# Patient Record
Sex: Male | Born: 1940
Health system: Southern US, Community
[De-identification: ages and names within clinical notes are randomized; demographics above are authoritative.]

## PROBLEM LIST (undated history)

## (undated) DIAGNOSIS — M1990 Unspecified osteoarthritis, unspecified site: Secondary | ICD-10-CM

## (undated) DIAGNOSIS — K219 Gastro-esophageal reflux disease without esophagitis: Secondary | ICD-10-CM

## (undated) DIAGNOSIS — I10 Essential (primary) hypertension: Secondary | ICD-10-CM

## (undated) DIAGNOSIS — N529 Male erectile dysfunction, unspecified: Secondary | ICD-10-CM

## (undated) DIAGNOSIS — D352 Benign neoplasm of pituitary gland: Secondary | ICD-10-CM

## (undated) DIAGNOSIS — N189 Chronic kidney disease, unspecified: Secondary | ICD-10-CM

## (undated) DIAGNOSIS — Z7251 High risk heterosexual behavior: Secondary | ICD-10-CM

## (undated) DIAGNOSIS — E785 Hyperlipidemia, unspecified: Secondary | ICD-10-CM

## (undated) DIAGNOSIS — J189 Pneumonia, unspecified organism: Secondary | ICD-10-CM

## (undated) DIAGNOSIS — F17209 Nicotine dependence, unspecified, with unspecified nicotine-induced disorders: Secondary | ICD-10-CM

## (undated) HISTORY — DX: Nicotine dependence, unspecified, with unspecified nicotine-induced disorders: F17.209

## (undated) HISTORY — DX: Male erectile dysfunction, unspecified: N52.9

## (undated) HISTORY — DX: Essential (primary) hypertension: I10

## (undated) HISTORY — DX: High risk heterosexual behavior: Z72.51

## (undated) HISTORY — DX: Chronic kidney disease, unspecified: N18.9

## (undated) HISTORY — DX: Hyperlipidemia, unspecified: E78.5

---
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
## 2010-01-05 DIAGNOSIS — J189 Pneumonia, unspecified organism: Secondary | ICD-10-CM

## 2010-01-05 HISTORY — DX: Pneumonia, unspecified organism: J18.9

## 2011-11-17 LAB — HM COLONOSCOPY

## 2013-01-05 HISTORY — PX: COLONOSCOPY: SHX174

## 2014-04-19 ENCOUNTER — Encounter: Payer: Self-pay | Admitting: *Deleted

## 2014-04-19 DIAGNOSIS — Z720 Tobacco use: Secondary | ICD-10-CM

## 2014-04-19 DIAGNOSIS — E785 Hyperlipidemia, unspecified: Secondary | ICD-10-CM | POA: Insufficient documentation

## 2014-04-19 DIAGNOSIS — N528 Other male erectile dysfunction: Secondary | ICD-10-CM

## 2014-04-19 DIAGNOSIS — I129 Hypertensive chronic kidney disease with stage 1 through stage 4 chronic kidney disease, or unspecified chronic kidney disease: Secondary | ICD-10-CM | POA: Insufficient documentation

## 2014-04-19 DIAGNOSIS — N189 Chronic kidney disease, unspecified: Secondary | ICD-10-CM | POA: Insufficient documentation

## 2014-04-19 DIAGNOSIS — I10 Essential (primary) hypertension: Secondary | ICD-10-CM

## 2014-04-19 DIAGNOSIS — N529 Male erectile dysfunction, unspecified: Secondary | ICD-10-CM | POA: Insufficient documentation

## 2014-04-19 DIAGNOSIS — Z7251 High risk heterosexual behavior: Secondary | ICD-10-CM

## 2014-04-19 DIAGNOSIS — F17210 Nicotine dependence, cigarettes, uncomplicated: Secondary | ICD-10-CM | POA: Insufficient documentation

## 2014-06-13 ENCOUNTER — Encounter: Payer: Self-pay | Admitting: Unknown Physician Specialty

## 2014-06-13 ENCOUNTER — Ambulatory Visit (INDEPENDENT_AMBULATORY_CARE_PROVIDER_SITE_OTHER): Payer: PPO | Admitting: Unknown Physician Specialty

## 2014-06-13 VITALS — BP 117/76 | HR 51 | Temp 97.2°F | Ht 69.5 in | Wt 222.0 lb

## 2014-06-13 DIAGNOSIS — I10 Essential (primary) hypertension: Secondary | ICD-10-CM

## 2014-06-13 DIAGNOSIS — N183 Chronic kidney disease, stage 3 (moderate): Secondary | ICD-10-CM

## 2014-06-13 NOTE — Assessment & Plan Note (Signed)
Check kidney function with GFR of 47

## 2014-06-13 NOTE — Progress Notes (Signed)
   BP 117/76 mmHg  Pulse 51  Temp(Src) 97.2 F (36.2 C) (Oral)  Ht 5' 9.5" (1.765 m)  Wt 222 lb (100.699 kg)  BMI 32.32 kg/m2  SpO2 98%   Subjective:    Patient ID: Matthew Randolph, male    DOB: Feb 27, 1940, 74 y.o.   MRN: [id number]  HPI: Matthew Randolph is a 74 y.o. male presenting on 06/13/2014 for Follow-up      Relevant past medical, surgical, family and social history reviewed and updated as indicated. Interim medical history since our last visit reviewed. Allergies and medications reviewed and updated.  Hypertension This is a chronic problem. The problem is controlled. Pertinent negatives include no anxiety, blurred vision, chest pain, headaches, malaise/fatigue, neck pain, orthopnea, palpitations, peripheral edema, PND, shortness of breath or sweats. The current treatment provides significant improvement. There are no compliance problems.  Hypertensive end-organ damage includes kidney disease.     Review of Systems  Constitutional: Negative for malaise/fatigue.  Eyes: Negative for blurred vision.  Respiratory: Negative for shortness of breath.   Cardiovascular: Negative for chest pain, palpitations, orthopnea and PND.  Musculoskeletal: Negative for neck pain.  Neurological: Negative for headaches.      Per HPI unless specifically indicated above     Objective:    BP 117/76 mmHg  Pulse 51  Temp(Src) 97.2 F (36.2 C) (Oral)  Ht 5' 9.5" (1.765 m)  Wt 222 lb (100.699 kg)  BMI 32.32 kg/m2  SpO2 98%  Wt Readings from Last 3 Encounters:  06/13/14 222 lb (100.699 kg)  12/13/13 224 lb (101.606 kg)  04/19/14 224 lb (101.606 kg)    Physical Exam  Constitutional: He is oriented to person, place, and time. He appears well-developed and well-nourished. No distress.  HENT:  Head: Normocephalic and atraumatic.  Eyes: Conjunctivae and lids are normal. Right eye exhibits no discharge. Left eye exhibits no discharge. No scleral icterus.  Cardiovascular: Normal rate and  regular rhythm.   Pulmonary/Chest: Effort normal and breath sounds normal. No respiratory distress.  Abdominal: Normal appearance. There is no splenomegaly or hepatomegaly.  Musculoskeletal: Normal range of motion.  Lymphadenopathy:    He has no cervical adenopathy.  Neurological: He is alert and oriented to person, place, and time.  Skin: Skin is warm, dry and intact. No rash noted. No pallor.  Psychiatric: He has a normal mood and affect. His behavior is normal. Judgment and thought content normal.         Assessment & Plan:    Problem List Items Addressed This Visit      Cardiovascular and Mediastinum   Benign essential HTN - Primary    Check kidney function with GFR of 47        Genitourinary   CKD (chronic kidney disease)    Check CMP         Follow up plan: 6 months.  Consider nephrology refill

## 2014-06-13 NOTE — Assessment & Plan Note (Signed)
Check CMP.  ?

## 2014-06-13 NOTE — Patient Instructions (Addendum)
Hypertension Hypertension, commonly called high blood pressure, is when the force of blood pumping through your arteries is too strong. Your arteries are the blood vessels that carry blood from your heart throughout your body. A blood pressure reading consists of a higher number over a lower number, such as 110/72. The higher number (systolic) is the pressure inside your arteries when your heart pumps. The lower number (diastolic) is the pressure inside your arteries when your heart relaxes. Ideally you want your blood pressure below 120/80. Hypertension forces your heart to work harder to pump blood. Your arteries may become narrow or stiff. Having hypertension puts you at risk for heart disease, stroke, and other problems.  RISK FACTORS Some risk factors for high blood pressure are controllable. Others are not.  Risk factors you cannot control include:   Race. You may be at higher risk if you are African American.  Age. Risk increases with age.  Gender. Men are at higher risk than women before age 45 years. After age 65, women are at higher risk than men. Risk factors you can control include:  Not getting enough exercise or physical activity.  Being overweight.  Getting too much fat, sugar, calories, or salt in your diet.  Drinking too much alcohol. SIGNS AND SYMPTOMS Hypertension does not usually cause signs or symptoms. Extremely high blood pressure (hypertensive crisis) may cause headache, anxiety, shortness of breath, and nosebleed. DIAGNOSIS  To check if you have hypertension, your health care provider will measure your blood pressure while you are seated, with your arm held at the level of your heart. It should be measured at least twice using the same arm. Certain conditions can cause a difference in blood pressure between your right and left arms. A blood pressure reading that is higher than normal on one occasion does not mean that you need treatment. If one blood pressure reading  is high, ask your health care provider about having it checked again. TREATMENT  Treating high blood pressure includes making lifestyle changes and possibly taking medicine. Living a healthy lifestyle can help lower high blood pressure. You may need to change some of your habits. Lifestyle changes may include:  Following the DASH diet. This diet is high in fruits, vegetables, and whole grains. It is low in salt, red meat, and added sugars.  Getting at least 2 hours of brisk physical activity every week.  Losing weight if necessary.  Not smoking.  Limiting alcoholic beverages.  Learning ways to reduce stress. If lifestyle changes are not enough to get your blood pressure under control, your health care provider may prescribe medicine. You may need to take more than one. Work closely with your health care provider to understand the risks and benefits. HOME CARE INSTRUCTIONS  Have your blood pressure rechecked as directed by your health care provider.   Take medicines only as directed by your health care provider. Follow the directions carefully. Blood pressure medicines must be taken as prescribed. The medicine does not work as well when you skip doses. Skipping doses also puts you at risk for problems.   Do not smoke.   Monitor your blood pressure at home as directed by your health care provider. SEEK MEDICAL CARE IF:   You think you are having a reaction to medicines taken.  You have recurrent headaches or feel dizzy.  You have swelling in your ankles.  You have trouble with your vision. SEEK IMMEDIATE MEDICAL CARE IF:  You develop a severe headache or confusion.    You have unusual weakness, numbness, or feel faint.  You have severe chest or abdominal pain.  You vomit repeatedly.  You have trouble breathing. MAKE SURE YOU:   Understand these instructions.  Will watch your condition.  Will get help right away if you are not doing well or get worse. Document  Released: 12/22/2004 Document Revised: 05/08/2013 Document Reviewed: 10/14/2012 [facility] Patient Information 2015 [place], [place]. This information is not intended to replace advice given to you by your health care provider. Make sure you discuss any questions you have with your health care provider. Hypertension Hypertension, commonly called high blood pressure, is when the force of blood pumping through your arteries is too strong. Your arteries are the blood vessels that carry blood from your heart throughout your body. A blood pressure reading consists of a higher number over a lower number, such as 110/72. The higher number (systolic) is the pressure inside your arteries when your heart pumps. The lower number (diastolic) is the pressure inside your arteries when your heart relaxes. Ideally you want your blood pressure below 120/80. Hypertension forces your heart to work harder to pump blood. Your arteries may become narrow or stiff. Having hypertension puts you at risk for heart disease, stroke, and other problems.  RISK FACTORS Some risk factors for high blood pressure are controllable. Others are not.  Risk factors you cannot control include:   Race. You may be at higher risk if you are African American.  Age. Risk increases with age.  Gender. Men are at higher risk than women before age 93 years. After age 91, women are at higher risk than men. Risk factors you can control include:  Not getting enough exercise or physical activity.  Being overweight.  Getting too much fat, sugar, calories, or salt in your diet.  Drinking too much alcohol. SIGNS AND SYMPTOMS Hypertension does not usually cause signs or symptoms. Extremely high blood pressure (hypertensive crisis) may cause headache, anxiety, shortness of breath, and nosebleed. DIAGNOSIS  To check if you have hypertension, your health care provider will measure your blood pressure while you are seated, with your arm held at the level  of your heart. It should be measured at least twice using the same arm. Certain conditions can cause a difference in blood pressure between your right and left arms. A blood pressure reading that is higher than normal on one occasion does not mean that you need treatment. If one blood pressure reading is high, ask your health care provider about having it checked again. TREATMENT  Treating high blood pressure includes making lifestyle changes and possibly taking medicine. Living a healthy lifestyle can help lower high blood pressure. You may need to change some of your habits. Lifestyle changes may include:  Following the DASH diet. This diet is high in fruits, vegetables, and whole grains. It is low in salt, red meat, and added sugars.  Getting at least 2 hours of brisk physical activity every week.  Losing weight if necessary.  Not smoking.  Limiting alcoholic beverages.  Learning ways to reduce stress. If lifestyle changes are not enough to get your blood pressure under control, your health care provider may prescribe medicine. You may need to take more than one. Work closely with your health care provider to understand the risks and benefits. HOME CARE INSTRUCTIONS  Have your blood pressure rechecked as directed by your health care provider.   Take medicines only as directed by your health care provider. Follow the directions carefully. Blood  pressure medicines must be taken as prescribed. The medicine does not work as well when you skip doses. Skipping doses also puts you at risk for problems.   Do not smoke.   Monitor your blood pressure at home as directed by your health care provider. SEEK MEDICAL CARE IF:   You think you are having a reaction to medicines taken.  You have recurrent headaches or feel dizzy.  You have swelling in your ankles.  You have trouble with your vision. SEEK IMMEDIATE MEDICAL CARE IF:  You develop a severe headache or confusion.  You have  unusual weakness, numbness, or feel faint.  You have severe chest or abdominal pain.  You vomit repeatedly.  You have trouble breathing. MAKE SURE YOU:   Understand these instructions.  Will watch your condition.  Will get help right away if you are not doing well or get worse. Document Released: 12/22/2004 Document Revised: 05/08/2013 Document Reviewed: 10/14/2012 [facility] Patient Information 2015 [place], [place]. This information is not intended to replace advice given to you by your health care provider. Make sure you discuss any questions you have with your health care provider.

## 2014-06-14 ENCOUNTER — Telehealth: Payer: Self-pay

## 2014-06-14 LAB — COMPREHENSIVE METABOLIC PANEL
A/G RATIO: 1.5 (ref 1.1–2.5)
ALT: 24 IU/L (ref 0–44)
AST: 19 IU/L (ref 0–40)
Albumin: 4.3 g/dL (ref 3.5–4.8)
Alkaline Phosphatase: 59 IU/L (ref 39–117)
BUN/Creatinine Ratio: 8 — ABNORMAL LOW (ref 10–22)
BUN: 11 mg/dL (ref 8–27)
Bilirubin Total: 0.4 mg/dL (ref 0.0–1.2)
CO2: 23 mmol/L (ref 18–29)
CREATININE: 1.45 mg/dL — AB (ref 0.76–1.27)
Calcium: 9.8 mg/dL (ref 8.6–10.2)
Chloride: 100 mmol/L (ref 97–108)
GFR calc Af Amer: 56 mL/min/{1.73_m2} — ABNORMAL LOW (ref >59)
GFR calc non Af Amer: 48 mL/min/{1.73_m2} — ABNORMAL LOW (ref >59)
GLOBULIN, TOTAL: 2.9 g/dL (ref 1.5–4.5)
Glucose: 105 mg/dL — ABNORMAL HIGH (ref 65–99)
Potassium: 5 mmol/L (ref 3.5–5.2)
SODIUM: 140 mmol/L (ref 134–144)
Total Protein: 7.2 g/dL (ref 6.0–8.5)

## 2014-06-14 NOTE — Telephone Encounter (Signed)
-----   Message from Kathrine Haddock, NP sent at 06/14/2014  8:36 AM EDT ----- Please let patient know that lab work was good.  Kidney function about the same.  We will continue to monitor this every 6 months. The blood pressure medication he is on helps protect the kidneys and will want to keep him on it even if his blood pressure is normal.   Blood sugar just slightly above normal and we will also monitor this.  Liver functions are fine.

## 2014-06-14 NOTE — Telephone Encounter (Signed)
Patient notified

## 2014-09-26 ENCOUNTER — Ambulatory Visit (INDEPENDENT_AMBULATORY_CARE_PROVIDER_SITE_OTHER): Payer: PPO | Admitting: Unknown Physician Specialty

## 2014-09-26 ENCOUNTER — Ambulatory Visit
Admission: RE | Admit: 2014-09-26 | Discharge: 2014-09-26 | Disposition: A | Discharge status: Home / Self Care | Payer: PPO | Source: Ambulatory Visit | Attending: Unknown Physician Specialty | Admitting: Unknown Physician Specialty

## 2014-09-26 ENCOUNTER — Encounter: Payer: Self-pay | Admitting: Unknown Physician Specialty

## 2014-09-26 VITALS — BP 130/76 | HR 58 | Temp 98.6°F | Ht 70.2 in | Wt 213.0 lb

## 2014-09-26 DIAGNOSIS — S76311A Strain of muscle, fascia and tendon of the posterior muscle group at thigh level, right thigh, initial encounter: Secondary | ICD-10-CM

## 2014-09-26 DIAGNOSIS — S76011A Strain of muscle, fascia and tendon of right hip, initial encounter: Secondary | ICD-10-CM

## 2014-09-26 DIAGNOSIS — X58XXXA Exposure to other specified factors, initial encounter: Secondary | ICD-10-CM | POA: Insufficient documentation

## 2014-09-26 DIAGNOSIS — S76019A Strain of muscle, fascia and tendon of unspecified hip, initial encounter: Secondary | ICD-10-CM | POA: Insufficient documentation

## 2014-09-26 MED ORDER — MELOXICAM 15 MG PO TABS: 15.0000 mg | ORAL_TABLET | Freq: Every day | ORAL | Status: DC

## 2014-09-26 NOTE — Assessment & Plan Note (Signed)
Ordered DG Pelvis 1-2 Views Prescribed Meloxicam (Mobic) 15 mg daily instructed pt to not take Ibuprofen while taking this medication Instructed to return if symptoms worsen or persist

## 2014-09-26 NOTE — Progress Notes (Signed)
BP 130/76 mmHg  Pulse 58  Temp(Src) 98.6 F (37 C)  Ht 5' 10.2" (1.783 m)  Wt 213 lb (96.616 kg)  BMI 30.39 kg/m2  SpO2 97%   Subjective:    Patient ID: Matthew Randolph, male    DOB: [birth year], 74 y.o.   MRN: [id number]  HPI: Matthew Randolph is a 74 y.o. male  Chief Complaint  Patient presents with  . Inguinal Hernia    pt states he thinks he has a hernia on the right side in the groin region, was lifting heavy bucket about a month ago   Right Groin Pain Pt presents with c/o right groin pain onset 1 month ago.  He picked up a jug of acid and felt pain that started at the right groin and traveled down his leg to the knee and right hip. He feels like when he walks he has a "catching sensation".  He had trouble walking following the injury.  He states he feels a slight bulge in his groin.  He has been using ibuprofen and icy hot with some relief of symptoms.  Aggravating factors include walking.  Rest improves the symptoms.  Pertinent negatives denies fever, nausea, vomiting, dysuria, scrotal or penile swelling, numbness or tingling to the lower extremities, bowel or bladder incontinence.  Relevant past medical, surgical, family and social history reviewed and updated as indicated. Interim medical history since our last visit reviewed. Allergies and medications reviewed and updated.  Review of Systems  Constitutional: Negative for fever, chills and fatigue.  Gastrointestinal: Negative for nausea, vomiting, abdominal pain, diarrhea, constipation and abdominal distention.  Genitourinary: Negative for dysuria, urgency, decreased urine volume, penile swelling, scrotal swelling, penile pain and testicular pain.  Musculoskeletal: Negative for back pain.       Right hip pain  Skin: Negative for color change and rash.  Neurological: Negative for tremors, syncope, weakness, light-headedness, numbness and headaches.    Per HPI unless specifically indicated above     Objective:    BP  130/76 mmHg  Pulse 58  Temp(Src) 98.6 F (37 C)  Ht 5' 10.2" (1.783 m)  Wt 213 lb (96.616 kg)  BMI 30.39 kg/m2  SpO2 97%  Wt Readings from Last 3 Encounters:  09/26/14 213 lb (96.616 kg)  06/13/14 222 lb (100.699 kg)  12/13/13 224 lb (101.606 kg)    Physical Exam  Constitutional: He is oriented to person, place, and time. He appears well-developed and well-nourished. No distress.  HENT:  Head: Normocephalic and atraumatic.  Right Ear: External ear normal.  Left Ear: External ear normal.  Nose: Nose normal.  Neck: Normal range of motion. Neck supple.  Cardiovascular: Normal rate, regular rhythm, normal heart sounds and intact distal pulses.   Pulmonary/Chest: Effort normal and breath sounds normal. No respiratory distress. He has no wheezes.  Abdominal: Soft. Bowel sounds are normal. He exhibits no distension and no mass. There is no rebound.  Right groin tenderness with palpation  Musculoskeletal: He exhibits no edema.  Right hip and groin tenderness Decreased ROM of right lower extremity  Neurological: He is alert and oriented to person, place, and time.  Skin: Skin is warm and dry. No rash noted. He is not diaphoretic. No erythema.  Psychiatric: He has a normal mood and affect. His behavior is normal. Judgment and thought content normal.    Results for orders placed or performed in visit on 06/13/14  Comp Met (CMET)  Result Value Ref Range   Glucose 105 (H)  65 - 99 mg/dL   BUN 11 8 - 27 mg/dL   Creatinine, Ser 1.45 (H) 0.76 - 1.27 mg/dL   GFR calc non Af Amer 48 (L) >59 mL/min/1.73   GFR calc Af Amer 56 (L) >59 mL/min/1.73   BUN/Creatinine Ratio 8 (L) 10 - 22   Sodium 140 134 - 144 mmol/L   Potassium 5.0 3.5 - 5.2 mmol/L   Chloride 100 97 - 108 mmol/L   CO2 23 18 - 29 mmol/L   Calcium 9.8 8.6 - 10.2 mg/dL   Total Protein 7.2 6.0 - 8.5 g/dL   Albumin 4.3 3.5 - 4.8 g/dL   Globulin, Total 2.9 1.5 - 4.5 g/dL   Albumin/Globulin Ratio 1.5 1.1 - 2.5   Bilirubin Total 0.4  0.0 - 1.2 mg/dL   Alkaline Phosphatase 59 39 - 117 IU/L   AST 19 0 - 40 IU/L   ALT 24 0 - 44 IU/L      Assessment & Plan:   Problem List Items Addressed This Visit      Unprioritized   Strain of psoas muscle - Primary    Ordered DG Pelvis 1-2 Views Prescribed Meloxicam (Mobic) 15 mg daily instructed pt to not take Ibuprofen while taking this medication Instructed to return if symptoms worsen or persist      Relevant Orders   DG Pelvis 1-2 Views       Follow up plan: Return if symptoms worsen or fail to improve.

## 2014-11-05 ENCOUNTER — Other Ambulatory Visit: Payer: Self-pay | Admitting: Unknown Physician Specialty

## 2014-11-27 ENCOUNTER — Other Ambulatory Visit: Payer: Self-pay | Admitting: Unknown Physician Specialty

## 2014-11-28 ENCOUNTER — Telehealth: Payer: Self-pay | Admitting: Unknown Physician Specialty

## 2014-11-28 NOTE — Telephone Encounter (Signed)
Looked in patient's chart and the medication was refilled yesterday and sent to [facility]. I called the pharmacy to make sure they got the prescription and they stated they did and that it was sent out yesterday. I then called and left the patient a voicemail stating this information.

## 2014-11-28 NOTE — Telephone Encounter (Signed)
Pt needs refill on lisinopril sent to [facility]. Pt would like to have refills put in if possible

## 2014-12-06 ENCOUNTER — Other Ambulatory Visit: Payer: Self-pay | Admitting: Unknown Physician Specialty

## 2014-12-12 ENCOUNTER — Ambulatory Visit: Payer: PPO | Admitting: Unknown Physician Specialty

## 2015-01-16 ENCOUNTER — Ambulatory Visit: Payer: PPO | Admitting: Unknown Physician Specialty

## 2015-01-23 ENCOUNTER — Encounter: Payer: Self-pay | Admitting: Unknown Physician Specialty

## 2015-01-23 ENCOUNTER — Ambulatory Visit (INDEPENDENT_AMBULATORY_CARE_PROVIDER_SITE_OTHER): Payer: PPO | Admitting: Unknown Physician Specialty

## 2015-01-23 VITALS — BP 117/66 | HR 46 | Temp 98.5°F | Ht 70.1 in | Wt 215.0 lb

## 2015-01-23 DIAGNOSIS — M25559 Pain in unspecified hip: Secondary | ICD-10-CM | POA: Insufficient documentation

## 2015-01-23 DIAGNOSIS — I10 Essential (primary) hypertension: Secondary | ICD-10-CM | POA: Diagnosis not present

## 2015-01-23 DIAGNOSIS — M25551 Pain in right hip: Secondary | ICD-10-CM

## 2015-01-23 LAB — LIPID PANEL PICCOLO, WAIVED
CHOL/HDL RATIO PICCOLO,WAIVE: 3.8 mg/dL
Cholesterol Piccolo, Waived: 243 mg/dL — ABNORMAL HIGH (ref <200)
HDL CHOL PICCOLO, WAIVED: 64 mg/dL (ref >59)
LDL CHOL CALC PICCOLO WAIVED: 158 mg/dL — AB (ref <100)
TRIGLYCERIDES PICCOLO,WAIVED: 107 mg/dL (ref <150)
VLDL CHOL CALC PICCOLO,WAIVE: 21 mg/dL (ref <30)

## 2015-01-23 LAB — MICROALBUMIN, URINE WAIVED
Creatinine, Urine Waived: 300 mg/dL (ref 10–300)
Microalb, Ur Waived: 30 mg/L — ABNORMAL HIGH (ref 0–19)
Microalb/Creat Ratio: <30 mg/g (ref <30)

## 2015-01-23 MED ORDER — MELOXICAM 15 MG PO TABS: 15.0000 mg | ORAL_TABLET | Freq: Every day | ORAL | Status: DC

## 2015-01-23 NOTE — Assessment & Plan Note (Signed)
Probable OA.  Will refer to Ortho and refill Meloxicam.

## 2015-01-23 NOTE — Progress Notes (Signed)
BP 117/66 mmHg  Pulse 46  Temp(Src) 98.5 F (36.9 C)  Ht 5' 10.1" (1.781 m)  Wt 215 lb (97.523 kg)  BMI 30.75 kg/m2  SpO2 98%   Subjective:    Patient ID: Matthew Randolph, male    DOB: 1940/08/24, 75 y.o.   MRN: [id number]  HPI: Matthew Randolph is a 76 y.o. male  Chief Complaint  Patient presents with  . Hypertension  . Groin Injury    pt states he is following up on a groin injury he had in the past  . Medication Refill    pt states he needs a refill on meloxicam   Groin pain States he is not over his groin injury that I evaluated in December.  X-rays show degenerative changes hips and spine He would like a refill of Meloxicam.  States pain comes and goes.    Hypertension Using medications without difficulty Average home BPs   No problems or lightheadedness No chest pain with exertion or shortness of breath No Edema  Memory Sister is concerned about possible memory loss and forgets to turn the stove off.  Sometimes is forgetful.  Pt states he has no problems besides hurting in his right hip.    Relevant past medical, surgical, family and social history reviewed and updated as indicated. Interim medical history since our last visit reviewed. Allergies and medications reviewed and updated.  Review of Systems  Per HPI unless specifically indicated above     Objective:    BP 117/66 mmHg  Pulse 46  Temp(Src) 98.5 F (36.9 C)  Ht 5' 10.1" (1.781 m)  Wt 215 lb (97.523 kg)  BMI 30.75 kg/m2  SpO2 98%  Wt Readings from Last 3 Encounters:  01/23/15 215 lb (97.523 kg)  09/26/14 213 lb (96.616 kg)  06/13/14 222 lb (100.699 kg)    Physical Exam  Constitutional: He is oriented to person, place, and time. He appears well-developed and well-nourished. No distress.  HENT:  Head: Normocephalic and atraumatic.  Eyes: Conjunctivae and lids are normal. Right eye exhibits no discharge. Left eye exhibits no discharge. No scleral icterus.  Neck: Normal range of motion. Neck  supple. No JVD present. Carotid bruit is not present.  Cardiovascular: Normal rate, regular rhythm and normal heart sounds.   Pulmonary/Chest: Effort normal and breath sounds normal. No respiratory distress.  Abdominal: Normal appearance. There is no splenomegaly or hepatomegaly.  Musculoskeletal: Normal range of motion.  Neurological: He is alert and oriented to person, place, and time.  Skin: Skin is warm, dry and intact. No rash noted. No pallor.  Psychiatric: He has a normal mood and affect. His behavior is normal. Judgment and thought content normal.    Results for orders placed or performed in visit on 01/16/15  HM COLONOSCOPY  Result Value Ref Range   HM Colonoscopy from [facility]       Assessment & Plan:   Problem List Items Addressed This Visit      Unprioritized   Benign essential HTN - Primary    Stable, continue present medications.        Relevant Orders   Lipid Panel Piccolo, Waived   Microalbumin, Urine Waived   Uric acid   Comprehensive metabolic panel   Hip pain    Probable OA.  Will refer to Ortho and refill Meloxicam.       Relevant Orders   Ambulatory referral to Orthopedic Surgery       Follow up plan: Return in about  6 months (around 07/23/2015) for physical.

## 2015-01-23 NOTE — Assessment & Plan Note (Signed)
Stable, continue present medications.   

## 2015-01-24 LAB — COMPREHENSIVE METABOLIC PANEL
A/G RATIO: 1.4 (ref 1.1–2.5)
ALT: 24 IU/L (ref 0–44)
AST: 18 IU/L (ref 0–40)
Albumin: 4.3 g/dL (ref 3.5–4.8)
Alkaline Phosphatase: 62 IU/L (ref 39–117)
BUN / CREAT RATIO: 7 — AB (ref 10–22)
BUN: 10 mg/dL (ref 8–27)
Bilirubin Total: 0.4 mg/dL (ref 0.0–1.2)
CALCIUM: 10.3 mg/dL — AB (ref 8.6–10.2)
CO2: 21 mmol/L (ref 18–29)
Chloride: 102 mmol/L (ref 96–106)
Creatinine, Ser: 1.4 mg/dL — ABNORMAL HIGH (ref 0.76–1.27)
GFR calc Af Amer: 58 mL/min/{1.73_m2} — ABNORMAL LOW (ref >59)
GFR, EST NON AFRICAN AMERICAN: 50 mL/min/{1.73_m2} — AB (ref >59)
GLOBULIN, TOTAL: 3.1 g/dL (ref 1.5–4.5)
Glucose: 114 mg/dL — ABNORMAL HIGH (ref 65–99)
POTASSIUM: 5.2 mmol/L (ref 3.5–5.2)
SODIUM: 142 mmol/L (ref 134–144)
Total Protein: 7.4 g/dL (ref 6.0–8.5)

## 2015-01-24 LAB — URIC ACID: Uric Acid: 5.3 mg/dL (ref 3.7–8.6)

## 2015-02-08 ENCOUNTER — Telehealth: Payer: Self-pay

## 2015-02-08 NOTE — Telephone Encounter (Signed)
-----   Message from Stark Klein sent at 02/07/2015  2:22 PM EST ----- pts gf called in and wanted to get lab results. i advised the pt to look on mychart but the gf insisted that they get a call back. There isn't a dpr available so i'm not sure if we can speak with her.

## 2015-02-08 NOTE — Telephone Encounter (Signed)
I sent a earlier message on Mychart which they received.  On phone call, i again reviewed his labs.

## 2015-02-08 NOTE — Telephone Encounter (Signed)
Matthew Randolph did you call this patient about his labs? I don't see where a letter was sent. The significant other is listed as an emergency contact, but I don't know if we can still talk to her or not.

## 2015-03-13 ENCOUNTER — Other Ambulatory Visit: Payer: Self-pay | Admitting: Unknown Physician Specialty

## 2015-03-14 ENCOUNTER — Other Ambulatory Visit: Payer: Self-pay | Admitting: Unknown Physician Specialty

## 2015-03-14 NOTE — Telephone Encounter (Signed)
Pt needs refill on lisinopril and metformin sent to [facility]

## 2015-03-14 NOTE — Telephone Encounter (Signed)
Routing to provider.   Last Visit: 01/23/2015 [facility]acy: [facility]  Request for Meloxicam 15mg  and Lisinopril 5mg .

## 2015-03-15 MED ORDER — MELOXICAM 15 MG PO TABS: 15.0000 mg | ORAL_TABLET | Freq: Every day | ORAL | Status: DC

## 2015-03-15 MED ORDER — LISINOPRIL 5 MG PO TABS: 5.0000 mg | ORAL_TABLET | Freq: Every day | ORAL | Status: DC

## 2015-03-15 NOTE — Telephone Encounter (Signed)
Please see chart.  Both rx's are up to date

## 2015-06-27 ENCOUNTER — Other Ambulatory Visit: Payer: Self-pay | Admitting: Unknown Physician Specialty

## 2015-10-10 ENCOUNTER — Other Ambulatory Visit: Payer: Self-pay | Admitting: Unknown Physician Specialty

## 2015-10-17 ENCOUNTER — Other Ambulatory Visit: Payer: Self-pay | Admitting: Ophthalmology

## 2015-10-17 DIAGNOSIS — H547 Unspecified visual loss: Secondary | ICD-10-CM

## 2015-10-25 NOTE — Telephone Encounter (Signed)
Your patient 

## 2015-11-01 ENCOUNTER — Ambulatory Visit: Payer: PPO

## 2015-11-27 ENCOUNTER — Other Ambulatory Visit: Payer: Self-pay | Admitting: Unknown Physician Specialty

## 2015-11-27 MED ORDER — MELOXICAM 15 MG PO TABS: 15.0000 mg | ORAL_TABLET | Freq: Every day | ORAL | 0 refills | Status: DC

## 2015-11-27 NOTE — Telephone Encounter (Signed)
Called and left patient a VM letting patient know that rx has been sent in.

## 2015-11-27 NOTE — Telephone Encounter (Signed)
Routing to provider  

## 2015-12-02 ENCOUNTER — Telehealth: Payer: Self-pay | Admitting: Unknown Physician Specialty

## 2015-12-02 NOTE — Telephone Encounter (Signed)
Pharmacy called and would like to know if he can have a 90 day supply of meloxicam (MOBIC) 15 MG tablet instead of the 30 day supply.

## 2015-12-02 NOTE — Telephone Encounter (Signed)
He needs to be seen for this.  Plus overdue to be seen anyway

## 2015-12-02 NOTE — Telephone Encounter (Signed)
Routing to provider  

## 2015-12-02 NOTE — Telephone Encounter (Signed)
Called and left patient a VM asking for him to please return my call.  

## 2015-12-02 NOTE — Telephone Encounter (Signed)
Called and left Marquila a secure VM at [place] letting her know why a 30 day supply of the medication was sent in.

## 2015-12-03 NOTE — Telephone Encounter (Signed)
Called and left patient a VM asking for him to please return my call.  

## 2015-12-04 ENCOUNTER — Other Ambulatory Visit
Admission: RE | Admit: 2015-12-04 | Discharge: 2015-12-04 | Disposition: A | Discharge status: Home / Self Care | Payer: PPO | Source: Ambulatory Visit | Attending: Ophthalmology | Admitting: Ophthalmology

## 2015-12-04 ENCOUNTER — Ambulatory Visit
Admission: RE | Admit: 2015-12-04 | Discharge: 2015-12-04 | Disposition: A | Discharge status: Home / Self Care | Payer: PPO | Source: Ambulatory Visit | Attending: Ophthalmology | Admitting: Ophthalmology

## 2015-12-04 ENCOUNTER — Telehealth: Payer: Self-pay | Admitting: Unknown Physician Specialty

## 2015-12-04 DIAGNOSIS — H547 Unspecified visual loss: Secondary | ICD-10-CM | POA: Diagnosis not present

## 2015-12-04 LAB — CREATININE, SERUM
CREATININE: 1.2 mg/dL (ref 0.61–1.24)
GFR calc Af Amer: >60 mL/min (ref >60)
GFR calc non Af Amer: 58 mL/min — ABNORMAL LOW (ref >60)

## 2015-12-04 MED ORDER — GADOBENATE DIMEGLUMINE 529 MG/ML IV SOLN
20.0000 mL | Freq: Once | INTRAVENOUS | Status: AC | PRN
  Administered 2015-12-04: 20 mL via INTRAVENOUS

## 2015-12-04 NOTE — Telephone Encounter (Signed)
Pt's relative called stated the pharmacy is still giving her the run around about the Meloxicam. Can someone please look into this. Please call pt back. Thanks.

## 2015-12-04 NOTE — Telephone Encounter (Signed)
Call was returned. I was explaining why the medication was only sent in for 30 days. Phone call disconnected so I tried to call back and left a VM asking for a returned call to make sure they got all of the information before the call dropped.

## 2015-12-04 NOTE — Telephone Encounter (Signed)
Patient called in and scheduled appointment for 12/27/15.

## 2015-12-04 NOTE — Telephone Encounter (Signed)
Called and left patient's relative a VM asking for her to please return my call.

## 2015-12-10 HISTORY — PX: PITUITARY SURGERY: SHX203

## 2015-12-27 ENCOUNTER — Ambulatory Visit: Payer: Self-pay | Admitting: Unknown Physician Specialty

## 2016-01-30 ENCOUNTER — Telehealth: Payer: Self-pay | Admitting: Unknown Physician Specialty

## 2016-01-30 NOTE — Telephone Encounter (Signed)
appt scheduled for 02/14/16.

## 2016-01-30 NOTE — Telephone Encounter (Signed)
Called and left patient a VM asking for him to please return my call. Patient is overdue for an appointment and I need to know if the patient needs medications sent to mail order as well. Looked up pharmacy by the phone number provided and it came up with [facility], chart updated. Will go ahead and route to provider to see if patient can get some medication sent to local pharmacy to prevent running out and wait on returned call to schedule appointment and ask about sending medication to mail order.

## 2016-01-30 NOTE — Telephone Encounter (Signed)
Pt is now using mail order Phone [phone] fax [phone] Would like lisinopril (PRINIVIL,ZESTRIL) 5 MG tablet and meloxicam (MOBIC) 15 MG tablet sent to [facility] until her mail order arrives.

## 2016-01-31 MED ORDER — MELOXICAM 15 MG PO TABS: 15.0000 mg | ORAL_TABLET | Freq: Every day | ORAL | 0 refills | Status: DC

## 2016-01-31 MED ORDER — LISINOPRIL 5 MG PO TABS: 5.0000 mg | ORAL_TABLET | Freq: Every day | ORAL | 1 refills | Status: DC

## 2016-02-12 DIAGNOSIS — D352 Benign neoplasm of pituitary gland: Secondary | ICD-10-CM | POA: Diagnosis not present

## 2016-02-14 ENCOUNTER — Ambulatory Visit (INDEPENDENT_AMBULATORY_CARE_PROVIDER_SITE_OTHER): Payer: Medicare HMO | Admitting: Unknown Physician Specialty

## 2016-02-14 ENCOUNTER — Encounter: Payer: Self-pay | Admitting: Unknown Physician Specialty

## 2016-02-14 VITALS — BP 133/74 | HR 57 | Temp 98.8°F | Ht 71.1 in | Wt 213.0 lb

## 2016-02-14 DIAGNOSIS — M1712 Unilateral primary osteoarthritis, left knee: Secondary | ICD-10-CM

## 2016-02-14 DIAGNOSIS — I10 Essential (primary) hypertension: Secondary | ICD-10-CM | POA: Diagnosis not present

## 2016-02-14 DIAGNOSIS — N183 Chronic kidney disease, stage 3 unspecified: Secondary | ICD-10-CM

## 2016-02-14 LAB — MICROALBUMIN, URINE WAIVED
CREATININE, URINE WAIVED: 200 mg/dL (ref 10–300)
Microalb, Ur Waived: 10 mg/L (ref 0–19)

## 2016-02-14 NOTE — Assessment & Plan Note (Signed)
Schedule for knee injection

## 2016-02-14 NOTE — Progress Notes (Signed)
BP 133/74 (BP Location: Left Arm, Patient Position: Sitting, Cuff Size: Large)   Pulse (!) 57   Temp 98.8 F (37.1 C)   Ht 5' 11.1" (1.806 m)   Wt 213 lb (96.6 kg)   SpO2 98%   BMI 29.62 kg/m    Subjective:    Patient ID: Matthew Randolph, male    DOB: 10-01-1940, 76 y.o.   MRN: [id number]  HPI: Matthew Randolph is a 76 y.o. male  Chief Complaint  Patient presents with  . Hypertension  . Pain    pt states that his left hip and knee has been bothering him  . Medication Refill    pt states he needs refills on both medications    Hypertension Using medications without difficulty Average home BPs not checking   No problems or lightheadedness No chest pain with exertion or shortness of breath No Edema  Recently had surgery for a pituitary adenoma  Hip/knee pain 30 Meloxicam lasts for about 3 months.  Pain right groin and now left knee is bothering.  He would like a cortisone shot.  States it is worse getting up and down   Relevant past medical, surgical, family and social history reviewed and updated as indicated. Interim medical history since our last visit reviewed. Allergies and medications reviewed and updated.  Review of Systems  Per HPI unless specifically indicated above     Objective:    BP 133/74 (BP Location: Left Arm, Patient Position: Sitting, Cuff Size: Large)   Pulse (!) 57   Temp 98.8 F (37.1 C)   Ht 5' 11.1" (1.806 m)   Wt 213 lb (96.6 kg)   SpO2 98%   BMI 29.62 kg/m   Wt Readings from Last 3 Encounters:  02/14/16 213 lb (96.6 kg)  01/23/15 215 lb (97.5 kg)  09/26/14 213 lb (96.6 kg)    Physical Exam  Constitutional: He is oriented to person, place, and time. He appears well-developed and well-nourished. No distress.  HENT:  Head: Normocephalic and atraumatic.  Eyes: Conjunctivae and lids are normal. Right eye exhibits no discharge. Left eye exhibits no discharge. No scleral icterus.  Neck: Normal range of motion. Neck supple. No JVD  present. Carotid bruit is not present.  Cardiovascular: Normal rate, regular rhythm and normal heart sounds.   Pulmonary/Chest: Effort normal and breath sounds normal. No respiratory distress.  Abdominal: Normal appearance. There is no splenomegaly or hepatomegaly.  Musculoskeletal: Normal range of motion.  Neurological: He is alert and oriented to person, place, and time.  Skin: Skin is warm, dry and intact. No rash noted. No pallor.  Psychiatric: He has a normal mood and affect. His behavior is normal. Judgment and thought content normal.    Results for orders placed or performed during the hospital encounter of 12/04/15  Creatinine, serum  Result Value Ref Range   Creatinine, Ser 1.20 0.61 - 1.24 mg/dL   GFR calc non Af Amer 58 (L) >60 mL/min   GFR calc Af Amer >60 >60 mL/min      Assessment & Plan:   Problem List Items Addressed This Visit      Unprioritized   Benign essential HTN - Primary   Relevant Orders   Microalbumin, Urine Waived   Comprehensive metabolic panel   Lipid Panel w/o Chol/HDL Ratio   CKD (chronic kidney disease)   Relevant Orders   Comprehensive metabolic panel   CBC with Differential/Platelet    Other Visit Diagnoses    Primary osteoarthritis  of left knee           Follow up plan: Return for schedule a shot in the knee.

## 2016-02-15 LAB — COMPREHENSIVE METABOLIC PANEL
ALT: 18 IU/L (ref 0–44)
AST: 15 IU/L (ref 0–40)
Albumin/Globulin Ratio: 1.4 (ref 1.2–2.2)
Albumin: 4.1 g/dL (ref 3.5–4.8)
Alkaline Phosphatase: 59 IU/L (ref 39–117)
BUN/Creatinine Ratio: 10 (ref 10–24)
BUN: 12 mg/dL (ref 8–27)
Bilirubin Total: 0.3 mg/dL (ref 0.0–1.2)
CALCIUM: 9.7 mg/dL (ref 8.6–10.2)
CHLORIDE: 101 mmol/L (ref 96–106)
CO2: 21 mmol/L (ref 18–29)
CREATININE: 1.2 mg/dL (ref 0.76–1.27)
GFR, EST AFRICAN AMERICAN: 69 mL/min/{1.73_m2} (ref >59)
GFR, EST NON AFRICAN AMERICAN: 60 mL/min/{1.73_m2} (ref >59)
Globulin, Total: 3 g/dL (ref 1.5–4.5)
Glucose: 99 mg/dL (ref 65–99)
Potassium: 4.2 mmol/L (ref 3.5–5.2)
Sodium: 140 mmol/L (ref 134–144)
TOTAL PROTEIN: 7.1 g/dL (ref 6.0–8.5)

## 2016-02-15 LAB — CBC WITH DIFFERENTIAL/PLATELET
BASOS ABS: 0 10*3/uL (ref 0.0–0.2)
BASOS: 0 %
EOS (ABSOLUTE): 0.1 10*3/uL (ref 0.0–0.4)
Eos: 1 %
HEMOGLOBIN: 10.9 g/dL — AB (ref 13.0–17.7)
Hematocrit: 34.4 % — ABNORMAL LOW (ref 37.5–51.0)
IMMATURE GRANS (ABS): 0 10*3/uL (ref 0.0–0.1)
Immature Granulocytes: 0 %
LYMPHS: 40 %
Lymphocytes Absolute: 3 10*3/uL (ref 0.7–3.1)
MCH: 26.6 pg (ref 26.6–33.0)
MCHC: 31.7 g/dL (ref 31.5–35.7)
MCV: 84 fL (ref 79–97)
Monocytes Absolute: 0.5 10*3/uL (ref 0.1–0.9)
Monocytes: 7 %
NEUTROS ABS: 3.9 10*3/uL (ref 1.4–7.0)
Neutrophils: 52 %
Platelets: 361 10*3/uL (ref 150–379)
RBC: 4.1 x10E6/uL — ABNORMAL LOW (ref 4.14–5.80)
RDW: 14.5 % (ref 12.3–15.4)
WBC: 7.5 10*3/uL (ref 3.4–10.8)

## 2016-02-15 LAB — LIPID PANEL W/O CHOL/HDL RATIO
CHOLESTEROL TOTAL: 202 mg/dL — AB (ref 100–199)
HDL: 57 mg/dL (ref >39)
LDL CALC: 129 mg/dL — AB (ref 0–99)
Triglycerides: 78 mg/dL (ref 0–149)
VLDL CHOLESTEROL CAL: 16 mg/dL (ref 5–40)

## 2016-02-17 ENCOUNTER — Encounter: Payer: Self-pay | Admitting: Unknown Physician Specialty

## 2016-02-17 ENCOUNTER — Ambulatory Visit (INDEPENDENT_AMBULATORY_CARE_PROVIDER_SITE_OTHER): Payer: Medicare HMO | Admitting: Unknown Physician Specialty

## 2016-02-17 VITALS — BP 137/76 | HR 71 | Temp 98.1°F | Wt 212.2 lb

## 2016-02-17 DIAGNOSIS — D649 Anemia, unspecified: Secondary | ICD-10-CM

## 2016-02-17 DIAGNOSIS — G8929 Other chronic pain: Secondary | ICD-10-CM

## 2016-02-17 DIAGNOSIS — M25562 Pain in left knee: Secondary | ICD-10-CM

## 2016-02-17 DIAGNOSIS — E782 Mixed hyperlipidemia: Secondary | ICD-10-CM

## 2016-02-17 DIAGNOSIS — M1612 Unilateral primary osteoarthritis, left hip: Secondary | ICD-10-CM

## 2016-02-17 MED ORDER — ATORVASTATIN CALCIUM 10 MG PO TABS: 10.0000 mg | ORAL_TABLET | Freq: Every day | ORAL | 3 refills | Status: DC

## 2016-02-17 NOTE — Progress Notes (Signed)
BP 137/76 (BP Location: Left Arm, Patient Position: Sitting, Cuff Size: Large)   Pulse 71   Temp 98.1 F (36.7 C)   Wt 212 lb 3.2 oz (96.3 kg)   SpO2 98%   BMI 29.51 kg/m    Subjective:    Patient ID: Matthew Randolph, male    DOB: June 27, 1940, 76 y.o.   MRN: [id number]  HPI: Matthew Randolph is a 76 y.o. male  Chief Complaint  Patient presents with  . Pain    pt states he would like the injection in his hip if possible, states thats where the pain starts and runs down left leg  . Orders    pt states he would like a order for knee brace, cane and handicapped sticker    Pt states the pain Is in his hip and then radiates to his knee.  He would also like a knee brace/cane.  Pain is is worse in the AM and when first getting up from sitting.  Describes it as "stiff."  Anemia Noted low H/H from 3 days ago.  Needs anemia panel  Hypercholesterol LDL 129.  ASCVD calculator recommends  statin therpy  Relevant past medical, surgical, family and social history reviewed and updated as indicated. Interim medical history since our last visit reviewed. Allergies and medications reviewed and updated.  Review of Systems  Per HPI unless specifically indicated above     Objective:    BP 137/76 (BP Location: Left Arm, Patient Position: Sitting, Cuff Size: Large)   Pulse 71   Temp 98.1 F (36.7 C)   Wt 212 lb 3.2 oz (96.3 kg)   SpO2 98%   BMI 29.51 kg/m   Wt Readings from Last 3 Encounters:  02/17/16 212 lb 3.2 oz (96.3 kg)  02/14/16 213 lb (96.6 kg)  01/23/15 215 lb (97.5 kg)    Physical Exam  Constitutional: He is oriented to person, place, and time. He appears well-developed and well-nourished. No distress.  HENT:  Head: Normocephalic and atraumatic.  Eyes: Conjunctivae and lids are normal. Right eye exhibits no discharge. Left eye exhibits no discharge. No scleral icterus.  Neck: Normal range of motion. Neck supple. No JVD present. Carotid bruit is not present.    Cardiovascular: Normal rate, regular rhythm and normal heart sounds.   Pulmonary/Chest: Effort normal and breath sounds normal. No respiratory distress.  Abdominal: Normal appearance. There is no splenomegaly or hepatomegaly.  Musculoskeletal:       Right hip: He exhibits decreased range of motion and decreased strength. He exhibits no tenderness, no bony tenderness and no swelling.  Increased groin pain with hip flexion and internal rotation  Neurological: He is alert and oriented to person, place, and time.  Skin: Skin is warm, dry and intact. No rash noted. No pallor.  Psychiatric: He has a normal mood and affect. His behavior is normal. Judgment and thought content normal.   X-ray from 2014  Assessment & Plan:   Problem List Items Addressed This Visit      Unprioritized   Hyperlipemia    Start Atorvastatin      Relevant Medications   atorvastatin (LIPITOR) 10 MG tablet   Osteoarthritis of left hip   Relevant Orders   Ambulatory referral to Orthopedic Surgery    Other Visit Diagnoses    Chronic pain of left knee    -  Primary   suspect pain related to hip OA.  Ahndicapped sticker written for 3 months and refer to orthopedics for  further managment   Anemia, unspecified type       Relevant Orders   Vitamin B12   Folate   Iron and TIBC   Ferritin       Follow up plan: Return in about 6 months (around 08/16/2016) for results.

## 2016-02-17 NOTE — Assessment & Plan Note (Signed)
Start Atorvastatin

## 2016-02-18 LAB — SPECIMEN STATUS

## 2016-02-21 ENCOUNTER — Telehealth: Payer: Self-pay | Admitting: Unknown Physician Specialty

## 2016-02-21 ENCOUNTER — Other Ambulatory Visit: Payer: Self-pay | Admitting: Unknown Physician Specialty

## 2016-02-21 LAB — FERRITIN: Ferritin: 165 ng/mL (ref 30–400)

## 2016-02-21 LAB — IRON AND TIBC
IRON SATURATION: 21 % (ref 15–55)
IRON: 57 ug/dL (ref 38–169)
TIBC: 278 ug/dL (ref 250–450)
UIBC: 221 ug/dL (ref 111–343)

## 2016-02-21 LAB — FOLATE: Folate: 12.5 ng/mL (ref >3.0)

## 2016-02-21 LAB — VITAMIN B12: Vitamin B-12: 470 pg/mL (ref 232–1245)

## 2016-02-21 MED ORDER — LISINOPRIL 5 MG PO TABS: 5.0000 mg | ORAL_TABLET | Freq: Every day | ORAL | 1 refills | Status: DC

## 2016-02-21 MED ORDER — MELOXICAM 15 MG PO TABS: 15.0000 mg | ORAL_TABLET | Freq: Every day | ORAL | 0 refills | Status: DC

## 2016-02-21 NOTE — Telephone Encounter (Signed)
Routing to provider  

## 2016-02-21 NOTE — Telephone Encounter (Signed)
I see that patient was notified of results on my chart. Is there anything I can tell patient about labs before I call back just in case they ask on the phone?

## 2016-02-21 NOTE — Telephone Encounter (Signed)
Please just let him know they looked good and will f/u with another draw next visit

## 2016-02-21 NOTE — Telephone Encounter (Signed)
Patient's sister notified of labs. She is listed on HIPPA in [place].

## 2016-02-21 NOTE — Telephone Encounter (Signed)
FYI it is his sister.

## 2016-03-02 ENCOUNTER — Telehealth: Payer: Self-pay

## 2016-03-02 MED ORDER — LISINOPRIL 5 MG PO TABS: 5.0000 mg | ORAL_TABLET | Freq: Every day | ORAL | 1 refills | Status: DC

## 2016-03-02 MED ORDER — MELOXICAM 15 MG PO TABS: 15.0000 mg | ORAL_TABLET | Freq: Every day | ORAL | 1 refills | Status: DC

## 2016-03-02 NOTE — Telephone Encounter (Signed)
Pharmacy sent a fax requesting a new prescription for patient's lisinopril and meloxicam. Can we send in 90 day supplies of these medications for the patient? Pharmacy is [facility].

## 2016-03-31 DIAGNOSIS — D352 Benign neoplasm of pituitary gland: Secondary | ICD-10-CM | POA: Diagnosis not present

## 2016-03-31 DIAGNOSIS — Z9889 Other specified postprocedural states: Secondary | ICD-10-CM | POA: Diagnosis not present

## 2016-07-09 ENCOUNTER — Telehealth: Payer: Self-pay | Admitting: Unknown Physician Specialty

## 2016-07-09 NOTE — Telephone Encounter (Signed)
Called pt to schedule Annual Wellness Visit with [facility]  - knb  °

## 2016-07-27 ENCOUNTER — Telehealth: Payer: Self-pay | Admitting: Unknown Physician Specialty

## 2016-07-27 NOTE — Telephone Encounter (Signed)
Called pt to schedule Annual Wellness Visit with [facility]  - knb  °

## 2016-07-28 NOTE — Telephone Encounter (Signed)
Pt returned call and scheduled for 7/25 with Tiffany

## 2016-07-29 ENCOUNTER — Ambulatory Visit (INDEPENDENT_AMBULATORY_CARE_PROVIDER_SITE_OTHER): Payer: Medicare HMO

## 2016-07-29 VITALS — BP 124/77 | HR 56 | Temp 98.6°F | Resp 16 | Ht 71.0 in | Wt 211.4 lb

## 2016-07-29 DIAGNOSIS — Z0000 Encounter for general adult medical examination without abnormal findings: Secondary | ICD-10-CM

## 2016-07-29 NOTE — Patient Instructions (Signed)
Matthew Randolph , Thank you for taking time to come for your Medicare Wellness Visit. I appreciate your ongoing commitment to your health goals. Please review the following plan we discussed and let me know if I can assist you in the future.   Screening recommendations/referrals: Colonoscopy: completed 12/14/2011 Recommended yearly ophthalmology/optometry visit for glaucoma screening and checkup Recommended yearly dental visit for hygiene and checkup  Vaccinations: Influenza vaccine: up to date  Pneumococcal vaccine: up to date Tdap vaccine: due, check with your insurance company for coverage Shingles vaccine: up to date  Advanced directives: Please bring a copy of your health care power of attorney and living will to the office at your convenience.  Conditions/risks identified: Smoking cessation discussed   Next appointment:Follow up on 08/19/2016 at 9:00am with Regino Schultze Follow up in one year for your annual wellness exam  Preventive Care 65 Years and Older, Male Preventive care refers to lifestyle choices and visits with your health care provider that can promote health and wellness. What does preventive care include?  A yearly physical exam. This is also called an annual well check.  Dental exams once or twice a year.  Routine eye exams. Ask your health care provider how often you should have your eyes checked.  Personal lifestyle choices, including:  Daily care of your teeth and gums.  Regular physical activity.  Eating a healthy diet.  Avoiding tobacco and drug use.  Limiting alcohol use.  Practicing safe sex.  Taking low doses of aspirin every day.  Taking vitamin and mineral supplements as recommended by your health care provider. What happens during an annual well check? The services and screenings done by your health care provider during your annual well check will depend on your age, overall health, lifestyle risk factors, and family history of  disease. Counseling  Your health care provider may ask you questions about your:  Alcohol use.  Tobacco use.  Drug use.  Emotional well-being.  Home and relationship well-being.  Sexual activity.  Eating habits.  History of falls.  Memory and ability to understand (cognition).  Work and work Statistician. Screening  You may have the following tests or measurements:  Height, weight, and BMI.  Blood pressure.  Lipid and cholesterol levels. These may be checked every 5 years, or more frequently if you are over 77 years old.  Skin check.  Lung cancer screening. You may have this screening every year starting at age 26 if you have a 30-pack-year history of smoking and currently smoke or have quit within the past 15 years.  Fecal occult blood test (FOBT) of the stool. You may have this test every year starting at age 67.  Flexible sigmoidoscopy or colonoscopy. You may have a sigmoidoscopy every 5 years or a colonoscopy every 10 years starting at age 70.  Prostate cancer screening. Recommendations will vary depending on your family history and other risks.  Hepatitis C blood test.  Hepatitis B blood test.  Sexually transmitted disease (STD) testing.  Diabetes screening. This is done by checking your blood sugar (glucose) after you have not eaten for a while (fasting). You may have this done every 1-3 years.  Abdominal aortic aneurysm (AAA) screening. You may need this if you are a current or former smoker.  Osteoporosis. You may be screened starting at age 75 if you are at high risk. Talk with your health care provider about your test results, treatment options, and if necessary, the need for more tests. Vaccines  Your health  care provider may recommend certain vaccines, such as:  Influenza vaccine. This is recommended every year.  Tetanus, diphtheria, and acellular pertussis (Tdap, Td) vaccine. You may need a Td booster every 10 years.  Zoster vaccine. You may  need this after age 8.  Pneumococcal 13-valent conjugate (PCV13) vaccine. One dose is recommended after age 66.  Pneumococcal polysaccharide (PPSV23) vaccine. One dose is recommended after age 40. Talk to your health care provider about which screenings and vaccines you need and how often you need them. This information is not intended to replace advice given to you by your health care provider. Make sure you discuss any questions you have with your health care provider. Document Released: 01/18/2015 Document Revised: 09/11/2015 Document Reviewed: 10/23/2014 Elsevier Interactive Patient Education  2017 [place] Prevention in the Home Falls can cause injuries. They can happen to people of all ages. There are many things you can do to make your home safe and to help prevent falls. What can I do on the outside of my home?  Regularly fix the edges of walkways and driveways and fix any cracks.  Remove anything that might make you trip as you walk through a door, such as a raised step or threshold.  Trim any bushes or trees on the path to your home.  Use bright outdoor lighting.  Clear any walking paths of anything that might make someone trip, such as rocks or tools.  Regularly check to see if handrails are loose or broken. Make sure that both sides of any steps have handrails.  Any raised decks and porches should have guardrails on the edges.  Have any leaves, snow, or ice cleared regularly.  Use sand or salt on walking paths during winter.  Clean up any spills in your garage right away. This includes oil or grease spills. What can I do in the bathroom?  Use night lights.  Install grab bars by the toilet and in the tub and shower. Do not use towel bars as grab bars.  Use non-skid mats or decals in the tub or shower.  If you need to sit down in the shower, use a plastic, non-slip stool.  Keep the floor dry. Clean up any water that spills on the floor as soon as it  happens.  Remove soap buildup in the tub or shower regularly.  Attach bath mats securely with double-sided non-slip rug tape.  Do not have throw rugs and other things on the floor that can make you trip. What can I do in the bedroom?  Use night lights.  Make sure that you have a light by your bed that is easy to reach.  Do not use any sheets or blankets that are too big for your bed. They should not hang down onto the floor.  Have a firm chair that has side arms. You can use this for support while you get dressed.  Do not have throw rugs and other things on the floor that can make you trip. What can I do in the kitchen?  Clean up any spills right away.  Avoid walking on wet floors.  Keep items that you use a lot in easy-to-reach places.  If you need to reach something above you, use a strong step stool that has a grab bar.  Keep electrical cords out of the way.  Do not use floor polish or wax that makes floors slippery. If you must use wax, use non-skid floor wax.  Do not have  throw rugs and other things on the floor that can make you trip. What can I do with my stairs?  Do not leave any items on the stairs.  Make sure that there are handrails on both sides of the stairs and use them. Fix handrails that are broken or loose. Make sure that handrails are as long as the stairways.  Check any carpeting to make sure that it is firmly attached to the stairs. Fix any carpet that is loose or worn.  Avoid having throw rugs at the top or bottom of the stairs. If you do have throw rugs, attach them to the floor with carpet tape.  Make sure that you have a light switch at the top of the stairs and the bottom of the stairs. If you do not have them, ask someone to add them for you. What else can I do to help prevent falls?  Wear shoes that:  Do not have high heels.  Have rubber bottoms.  Are comfortable and fit you well.  Are closed at the toe. Do not wear sandals.  If you  use a stepladder:  Make sure that it is fully opened. Do not climb a closed stepladder.  Make sure that both sides of the stepladder are locked into place.  Ask someone to hold it for you, if possible.  Clearly mark and make sure that you can see:  Any grab bars or handrails.  First and last steps.  Where the edge of each step is.  Use tools that help you move around (mobility aids) if they are needed. These include:  Canes.  Walkers.  Scooters.  Crutches.  Turn on the lights when you go into a dark area. Replace any light bulbs as soon as they burn out.  Set up your furniture so you have a clear path. Avoid moving your furniture around.  If any of your floors are uneven, fix them.  If there are any pets around you, be aware of where they are.  Review your medicines with your doctor. Some medicines can make you feel dizzy. This can increase your chance of falling. Ask your doctor what other things that you can do to help prevent falls. This information is not intended to replace advice given to you by your health care provider. Make sure you discuss any questions you have with your health care provider. Document Released: 10/18/2008 Document Revised: 05/30/2015 Document Reviewed: 01/26/2014 Elsevier Interactive Patient Education  2017 [employer].

## 2016-07-29 NOTE — Progress Notes (Signed)
Subjective:   Matthew Randolph is a 76 y.o. male who presents for Medicare Annual/Subsequent preventive examination.  Review of Systems:   Cardiac Risk Factors include: dyslipidemia;hypertension;advanced age (>62men, >68 women)     Objective:    Vitals: BP 124/77 (BP Location: Left Arm, Patient Position: Sitting)   Pulse (!) 56   Temp 98.6 F (37 C)   Resp 16   Ht 5\' 11"  (1.803 m)   Wt 211 lb 6.4 oz (95.9 kg)   BMI 29.48 kg/m   Body mass index is 29.48 kg/m.  Tobacco History  Smoking Status  . Current Every Day Smoker  . Packs/day: 0.25  . Types: Cigarettes  Smokeless Tobacco  . Never Used     Ready to quit: Yes Counseling given: Yes   Past Medical History:  Diagnosis Date  . Chronic kidney disease   . ED (erectile dysfunction)   . Erectile dysfunction   . High risk sexual behavior   . Hyperlipidemia   . Hypertension   . Tobacco use disorder, continuous    Past Surgical History:  Procedure Laterality Date  . PITUITARY SURGERY  12/10/2015   Family History  Problem Relation Age of Onset  . Brain cancer Mother   . Cancer Mother        Brain tumor  . Thyroid disease Mother   . Lung disease Mother        From [place]  . Heart disease Father   . Stroke Father   . Hypertension Brother   . Aneurysm Brother   . Pneumonia Brother   . Heart disease Sister        massive MI  . Obesity Sister    History  Sexual Activity  . Sexual activity: Yes  . Partners: Female    Outpatient Encounter Prescriptions as of 07/29/2016  Medication Sig  . atorvastatin (LIPITOR) 10 MG tablet Take 1 tablet (10 mg total) by mouth daily.  Marland Kitchen lisinopril (PRINIVIL,ZESTRIL) 5 MG tablet Take 1 tablet (5 mg total) by mouth daily.  . meloxicam (MOBIC) 15 MG tablet Take 1 tablet (15 mg total) by mouth daily.  . Multiple Vitamin (MULTIVITAMIN) tablet Take 1 tablet by mouth daily.  . pantoprazole (PROTONIX) 40 MG tablet Take 40 mg by mouth daily as needed.    No facility-administered  encounter medications on file as of 07/29/2016.     Activities of Daily Living In your present state of health, do you have any difficulty performing the following activities: 07/29/2016 02/14/2016  Hearing? N N  Vision? N N  Difficulty concentrating or making decisions? N N  Walking or climbing stairs? N Y  Dressing or bathing? N N  Doing errands, shopping? N N  Preparing Food and eating ? N -  Using the Toilet? N -  In the past six months, have you accidently leaked urine? N -  Do you have problems with loss of bowel control? N -  Managing your Medications? N -  Managing your Finances? N -  Housekeeping or managing your Housekeeping? N -  Some recent data might be hidden    Patient Care Team: Kathrine Haddock, NP as PCP - General (Nurse Practitioner) Derrill Memo, MD as Referring Physician (Neurosurgery)   Assessment:     Exercise Activities and Dietary recommendations Current Exercise Habits: The patient does not participate in regular exercise at present, Exercise limited by: None identified  Goals    . Quit smoking / using tobacco  Smoking cessation discussed       Fall Risk Fall Risk  07/29/2016 02/14/2016 09/26/2014  Falls in the past year? No No No   Depression Screen PHQ 2/9 Scores 07/29/2016 02/14/2016 09/26/2014  PHQ - 2 Score 0 0 0    Cognitive Function     6CIT Screen 07/29/2016  What Year? 0 points  What month? 0 points  What time? 0 points  Count back from 20 0 points  Months in reverse 0 points  Repeat phrase 6 points  Total Score 6    Immunization History  Administered Date(s) Administered  . Influenza-Unspecified 10/15/2015  . Pneumococcal Conjugate-13 12/13/2013  . Pneumococcal Polysaccharide-23 04/10/2008  . Pneumococcal-Unspecified 04/10/2008  . Td 10/05/2005, 10/05/2005  . Zoster 04/10/2008   Screening Tests Health Maintenance  Topic Date Due  . TETANUS/TDAP  07/29/2016 (Originally 10/06/2015)  . INFLUENZA VACCINE  08/05/2016    . COLONOSCOPY  11/16/2021  . PNA vac Low Risk Adult  Completed      Plan:    I have personally reviewed and addressed the Medicare Annual Wellness questionnaire and have noted the following in the patient's chart:  A. Medical and social history B. Use of alcohol, tobacco or illicit drugs  C. Current medications and supplements D. Functional ability and status E.  Nutritional status F.  Physical activity G. Advance directives H. List of other physicians I.  Hospitalizations, surgeries, and ER visits in previous 12 months J.  [place] such as hearing and vision if needed, cognitive and depression L. Referrals and appointments   In addition, I have reviewed and discussed with patient certain preventive protocols, quality metrics, and best practice recommendations. A written personalized care plan for preventive services as well as general preventive health recommendations were provided to patient.   Signed,  Tyler Aas, LPN Nurse Health Advisor   MD Recommendations: none

## 2016-08-19 ENCOUNTER — Encounter: Payer: Self-pay | Admitting: Unknown Physician Specialty

## 2016-08-19 ENCOUNTER — Ambulatory Visit (INDEPENDENT_AMBULATORY_CARE_PROVIDER_SITE_OTHER): Payer: Medicare HMO | Admitting: Unknown Physician Specialty

## 2016-08-19 VITALS — BP 130/47 | HR 56 | Temp 98.9°F | Ht 71.0 in | Wt 212.6 lb

## 2016-08-19 DIAGNOSIS — S60511A Abrasion of right hand, initial encounter: Secondary | ICD-10-CM

## 2016-08-19 DIAGNOSIS — E782 Mixed hyperlipidemia: Secondary | ICD-10-CM | POA: Diagnosis not present

## 2016-08-19 DIAGNOSIS — Z0000 Encounter for general adult medical examination without abnormal findings: Secondary | ICD-10-CM

## 2016-08-19 DIAGNOSIS — Z7189 Other specified counseling: Secondary | ICD-10-CM | POA: Insufficient documentation

## 2016-08-19 DIAGNOSIS — Z23 Encounter for immunization: Secondary | ICD-10-CM

## 2016-08-19 DIAGNOSIS — M1612 Unilateral primary osteoarthritis, left hip: Secondary | ICD-10-CM | POA: Diagnosis not present

## 2016-08-19 DIAGNOSIS — D649 Anemia, unspecified: Secondary | ICD-10-CM | POA: Insufficient documentation

## 2016-08-19 DIAGNOSIS — K219 Gastro-esophageal reflux disease without esophagitis: Secondary | ICD-10-CM | POA: Diagnosis not present

## 2016-08-19 DIAGNOSIS — R972 Elevated prostate specific antigen [PSA]: Secondary | ICD-10-CM | POA: Diagnosis not present

## 2016-08-19 DIAGNOSIS — I10 Essential (primary) hypertension: Secondary | ICD-10-CM | POA: Diagnosis not present

## 2016-08-19 NOTE — Assessment & Plan Note (Signed)
Continue with Meloxicam of daily as needed.

## 2016-08-19 NOTE — Assessment & Plan Note (Signed)
A voluntary discussion about advance care planning including the explanation and discussion of advance directives was extensively discussed  with the patient.  Explanation about the health care proxy and Living will was reviewed and packet with forms with explanation of how to fill them out was given.  During this discussion, the patient was able to identify a health care proxy as sister Rollene Fare and plans to fill out the paperwork required.  Patient was offered a separate [place] visit for further assistance with forms.

## 2016-08-19 NOTE — Patient Instructions (Addendum)
Td Vaccine (Tetanus and Diphtheria): What You Need to Know 1. Why get vaccinated? Tetanus  and diphtheria are very serious diseases. They are rare in the [place] today, but people who do become infected often have severe complications. Td vaccine is used to protect adolescents and adults from both of these diseases. Both tetanus and diphtheria are infections caused by bacteria. Diphtheria spreads from person to person through coughing or sneezing. Tetanus-causing bacteria enter the body through cuts, scratches, or wounds. TETANUS (lockjaw) causes painful muscle tightening and stiffness, usually all over the body.  It can lead to tightening of muscles in the head and neck so you can't open your mouth, swallow, or sometimes even breathe. Tetanus kills about 1 out of every 10 people who are infected even after receiving the best medical care.  DIPHTHERIA can cause a thick coating to form in the back of the throat.  It can lead to breathing problems, paralysis, heart failure, and death.  Before vaccines, as many as 200,000 cases of diphtheria and hundreds of cases of tetanus were reported in the [place] each year. Since vaccination began, reports of cases for both diseases have dropped by about 99%. 2. Td vaccine Td vaccine can protect adolescents and adults from tetanus and diphtheria. Td is usually given as a booster dose every 10 years but it can also be given earlier after a severe and dirty wound or burn. Another vaccine, called Tdap, which protects against pertussis in addition to tetanus and diphtheria, is sometimes recommended instead of Td vaccine. Your doctor or the person giving you the vaccine can give you more information. Td may safely be given at the same time as other vaccines. 3. Some people should not get this vaccine  A person who has ever had a life-threatening allergic reaction after a previous dose of any tetanus or diphtheria containing vaccine, OR has a severe  allergy to any part of this vaccine, should not get Td vaccine. Tell the person giving the vaccine about any severe allergies.  Talk to your doctor if you: ? had severe pain or swelling after any vaccine containing diphtheria or tetanus, ? ever had a condition called Guillain Barre Syndrome (GBS), ? aren't feeling well on the day the shot is scheduled. 4. What are the risks from Td vaccine? With any medicine, including vaccines, there is a chance of side effects. These are usually mild and go away on their own. Serious reactions are also possible but are rare. Most people who get Td vaccine do not have any problems with it. Mild problems following Td vaccine: (Did not interfere with activities)  Pain where the shot was given (about 8 people in 10)  Redness or swelling where the shot was given (about 1 person in 4)  Mild fever (rare)  Headache (about 1 person in 4)  Tiredness (about 1 person in 4)  Moderate problems following Td vaccine: (Interfered with activities, but did not require medical attention)  Fever over 102F (rare)  Severe problems following Td vaccine: (Unable to perform usual activities; required medical attention)  Swelling, severe pain, bleeding and/or redness in the arm where the shot was given (rare).  Problems that could happen after any vaccine:  People sometimes faint after a medical procedure, including vaccination. Sitting or lying down for about 15 minutes can help prevent fainting, and injuries caused by a fall. Tell your doctor if you feel dizzy, or have vision changes or ringing in the ears.  Some people get   severe pain in the shoulder and have difficulty moving the arm where a shot was given. This happens very rarely.  Any medication can cause a severe allergic reaction. Such reactions from a vaccine are very rare, estimated at fewer than 1 in a million doses, and would happen within a few minutes to a few hours after the vaccination. As with any  medicine, there is a very remote chance of a vaccine causing a serious injury or death. The safety of vaccines is always being monitored. For more information, visit: http://www.aguilar.org/ 5. What if there is a serious reaction? What should I look for? Look for anything that concerns you, such as signs of a severe allergic reaction, very high fever, or unusual behavior. Signs of a severe allergic reaction can include hives, swelling of the face and throat, difficulty breathing, a fast heartbeat, dizziness, and weakness. These would usually start a few minutes to a few hours after the vaccination. What should I do?  If you think it is a severe allergic reaction or other emergency that can't wait, call 9-1-1 or get the person to the nearest hospital. Otherwise, call your doctor.  Afterward, the reaction should be reported to the Vaccine Adverse Event Reporting System (VAERS). Your doctor might file this report, or you can do it yourself through the VAERS web site at www.vaers.SamedayNews.es, or by calling 269-872-2525. ? VAERS does not give medical advice. 6. The National Vaccine Injury Compensation Program The [employer] Vaccine Injury Compensation Program (VICP) is a federal program that was created to compensate people who may have been injured by certain vaccines. Persons who believe they may have been injured by a vaccine can learn about the program and about filing a claim by calling (248)306-9479 or visiting the [place] website at GoldCloset.com.ee. There is a time limit to file a claim for compensation. 7. How can I learn more?  Ask your doctor. He or she can give you the vaccine package insert or suggest other sources of information.  Call your local or state health department.  Contact the Centers for Disease Control and Prevention (CDC): ? Call 2145291422 (1-800-CDC-INFO) ? Visit CDC's website at http://hunter.com/ CDC Td Vaccine VIS (04/16/15) This information is  not intended to replace advice given to you by your health care provider. Make sure you discuss any questions you have with your health care provider.

## 2016-08-19 NOTE — Assessment & Plan Note (Signed)
Check lipid panel today 

## 2016-08-19 NOTE — Progress Notes (Signed)
BP (!) 130/47   Pulse (!) 56   Temp 98.9 F (37.2 C)   Ht 5\' 11"  (1.803 m)   Wt 212 lb 9.6 oz (96.4 kg)   SpO2 97%   BMI 29.65 kg/m    Subjective:    Patient ID: KHALEL ALMS, male    DOB: 07/02/1940, 76 y.o.   MRN: [id number]  HPI: TUCKER MINTER is a 76 y.o. male  Chief Complaint  Patient presents with  . Annual Exam    pt had wellness exam with [facility] on 07/29/16   Hypertension Using medications without difficulty Average home BPs   No problems or lightheadedness No chest pain with exertion or shortness of breath No Edema  Hyperlipidemia Using medications without problems: No Muscle aches  Diet compliance:Exercise:   Hip pain Takes Meloxicam for bilateral hip pain that radiates to knee.  He does have radiologic evidence of bilateral hip pain.    GERD Taking Protonix and doing well.  Limiting diet for aggravating foods.    Abrasion History of an abrasion on his hand secondary to using a lawn mower  Normocytic anemia Pt with history of anemia.    Past Medical History:  Diagnosis Date  . Chronic kidney disease   . ED (erectile dysfunction)   . Erectile dysfunction   . High risk sexual behavior   . Hyperlipidemia   . Hypertension   . Tobacco use disorder, continuous    Past Surgical History:  Procedure Laterality Date  . PITUITARY SURGERY  12/10/2015   Social History   Social History  . Marital status: Divorced    Spouse name: N/A  . Number of children: 4  . Years of education: 12th   Occupational History  . Retired [employer]    Social History Main Topics  . Smoking status: Current Every Day Smoker    Packs/day: 0.25    Types: Cigarettes  . Smokeless tobacco: Never Used  . Alcohol use No     Comment: occ.  . Drug use: No  . Sexual activity: Yes    Partners: Female   Other Topics Concern  . Not on file   Social History Narrative   ** Merged History Encounter **       Family History  Problem Relation Age of Onset  . Brain  cancer Mother   . Cancer Mother        Brain tumor  . Thyroid disease Mother   . Lung disease Mother        From [place]  . Heart disease Father   . Stroke Father   . Hypertension Brother   . Aneurysm Brother   . Pneumonia Brother   . Heart disease Sister        massive MI  . Obesity Sister       Relevant past medical, surgical, family and social history reviewed and updated as indicated. Interim medical history since our last visit reviewed. Allergies and medications reviewed and updated.  Review of Systems  Per HPI unless specifically indicated above     Objective:    BP (!) 130/47   Pulse (!) 56   Temp 98.9 F (37.2 C)   Ht 5\' 11"  (1.803 m)   Wt 212 lb 9.6 oz (96.4 kg)   SpO2 97%   BMI 29.65 kg/m   Wt Readings from Last 3 Encounters:  08/19/16 212 lb 9.6 oz (96.4 kg)  07/29/16 211 lb 6.4 oz (95.9 kg)  02/17/16 212 lb  3.2 oz (96.3 kg)    Physical Exam  Constitutional: He is oriented to person, place, and time. He appears well-developed and well-nourished.  HENT:  Head: Normocephalic.  Right Ear: Tympanic membrane, external ear and ear canal normal.  Left Ear: Tympanic membrane, external ear and ear canal normal.  Mouth/Throat: Uvula is midline, oropharynx is clear and moist and mucous membranes are normal.  Eyes: Pupils are equal, round, and reactive to light.  Cardiovascular: Normal rate, regular rhythm and normal heart sounds.  Exam reveals no gallop and no friction rub.   No murmur heard. Pulmonary/Chest: Effort normal and breath sounds normal. No respiratory distress.  Abdominal: Soft. Bowel sounds are normal. He exhibits no distension. There is no tenderness.  Musculoskeletal: Normal range of motion.  Neurological: He is alert and oriented to person, place, and time. He has normal reflexes.  Skin: Skin is warm and dry.  Psychiatric: He has a normal mood and affect. His behavior is normal. Judgment and thought content normal.       Assessment & Plan:     Problem List Items Addressed This Visit      Unprioritized   Advance care planning    A voluntary discussion about advance care planning including the explanation and discussion of advance directives was extensively discussed  with the patient.  Explanation about the health care proxy and Living will was reviewed and packet with forms with explanation of how to fill them out was given.  During this discussion, the patient was able to identify a health care proxy as sister Rollene Fare and plans to fill out the paperwork required.  Patient was offered a separate [place] visit for further assistance with forms.         Anemia   Relevant Orders   CBC with Differential/Platelet   Benign essential HTN    Stable, continue present medications.        Relevant Orders   Comprehensive metabolic panel   Elevated PSA    History of biopsies 3 years ago      Relevant Orders   PSA   GERD (gastroesophageal reflux disease)    Stable, continue present medications.        Hyperlipemia    Check lipid panel today      Relevant Orders   Lipid Panel w/o Chol/HDL Ratio   Osteoarthritis of left hip    Continue with Meloxicam of daily as needed.         Other Visit Diagnoses    Abrasion of right hand, initial encounter    -  Primary   Relevant Orders   Td vaccine greater than or equal to 7yo preservative free IM (Completed)   Annual physical exam           Follow up plan: Return in about 6 months (around 02/19/2017).

## 2016-08-19 NOTE — Assessment & Plan Note (Signed)
Stable, continue present medications.   

## 2016-08-19 NOTE — Assessment & Plan Note (Signed)
History of biopsies 3 years ago

## 2016-08-20 LAB — COMPREHENSIVE METABOLIC PANEL
A/G RATIO: 1.4 (ref 1.2–2.2)
ALT: 23 IU/L (ref 0–44)
AST: 30 IU/L (ref 0–40)
Albumin: 4.1 g/dL (ref 3.5–4.8)
Alkaline Phosphatase: 61 IU/L (ref 39–117)
BILIRUBIN TOTAL: 0.4 mg/dL (ref 0.0–1.2)
BUN/Creatinine Ratio: 10 (ref 10–24)
BUN: 12 mg/dL (ref 8–27)
CO2: 23 mmol/L (ref 20–29)
Calcium: 10 mg/dL (ref 8.6–10.2)
Chloride: 107 mmol/L — ABNORMAL HIGH (ref 96–106)
Creatinine, Ser: 1.18 mg/dL (ref 0.76–1.27)
GFR, EST AFRICAN AMERICAN: 70 mL/min/{1.73_m2} (ref >59)
GFR, EST NON AFRICAN AMERICAN: 61 mL/min/{1.73_m2} (ref >59)
GLOBULIN, TOTAL: 2.9 g/dL (ref 1.5–4.5)
Glucose: 105 mg/dL — ABNORMAL HIGH (ref 65–99)
Potassium: 4.6 mmol/L (ref 3.5–5.2)
Sodium: 143 mmol/L (ref 134–144)
Total Protein: 7 g/dL (ref 6.0–8.5)

## 2016-08-20 LAB — CBC WITH DIFFERENTIAL/PLATELET
BASOS: 1 %
Basophils Absolute: 0.1 10*3/uL (ref 0.0–0.2)
EOS (ABSOLUTE): 0.1 10*3/uL (ref 0.0–0.4)
EOS: 1 %
HEMATOCRIT: 37.4 % — AB (ref 37.5–51.0)
Hemoglobin: 11.9 g/dL — ABNORMAL LOW (ref 13.0–17.7)
IMMATURE GRANULOCYTES: 0 %
Immature Grans (Abs): 0 10*3/uL (ref 0.0–0.1)
Lymphocytes Absolute: 2.6 10*3/uL (ref 0.7–3.1)
Lymphs: 45 %
MCH: 26.1 pg — ABNORMAL LOW (ref 26.6–33.0)
MCHC: 31.8 g/dL (ref 31.5–35.7)
MCV: 82 fL (ref 79–97)
MONOS ABS: 0.5 10*3/uL (ref 0.1–0.9)
Monocytes: 9 %
NEUTROS PCT: 44 %
Neutrophils Absolute: 2.5 10*3/uL (ref 1.4–7.0)
Platelets: 339 10*3/uL (ref 150–379)
RBC: 4.56 x10E6/uL (ref 4.14–5.80)
RDW: 15 % (ref 12.3–15.4)
WBC: 5.8 10*3/uL (ref 3.4–10.8)

## 2016-08-20 LAB — LIPID PANEL W/O CHOL/HDL RATIO
CHOLESTEROL TOTAL: 149 mg/dL (ref 100–199)
HDL: 61 mg/dL (ref >39)
LDL CALC: 65 mg/dL (ref 0–99)
TRIGLYCERIDES: 115 mg/dL (ref 0–149)
VLDL CHOLESTEROL CAL: 23 mg/dL (ref 5–40)

## 2016-08-20 LAB — PSA: PROSTATE SPECIFIC AG, SERUM: 3.8 ng/mL (ref 0.0–4.0)

## 2016-08-21 ENCOUNTER — Encounter: Payer: Self-pay | Admitting: Unknown Physician Specialty

## 2016-08-31 ENCOUNTER — Encounter: Payer: Self-pay | Admitting: Unknown Physician Specialty

## 2016-09-04 ENCOUNTER — Encounter: Payer: Self-pay | Admitting: Unknown Physician Specialty

## 2016-09-04 DIAGNOSIS — Z7251 High risk heterosexual behavior: Secondary | ICD-10-CM

## 2016-09-10 ENCOUNTER — Other Ambulatory Visit: Payer: Self-pay | Admitting: Unknown Physician Specialty

## 2016-09-16 ENCOUNTER — Ambulatory Visit: Payer: Medicare HMO

## 2016-09-16 DIAGNOSIS — R69 Illness, unspecified: Secondary | ICD-10-CM | POA: Diagnosis not present

## 2016-09-16 DIAGNOSIS — Z7251 High risk heterosexual behavior: Secondary | ICD-10-CM

## 2016-09-17 LAB — RPR: RPR Ser Ql: NONREACTIVE

## 2016-09-17 LAB — HSV(HERPES SIMPLEX VRS) I + II AB-IGG
HSV 1 Glycoprotein G Ab, IgG: 27.9 index — ABNORMAL HIGH (ref 0.00–0.90)
HSV 2 IgG, Type Spec: 3.12 index — ABNORMAL HIGH (ref 0.00–0.90)

## 2016-09-17 LAB — HSV-2 IGG SUPPLEMENTAL TEST: HSV-2 IGG SUPPLEMENTAL TEST: POSITIVE — AB

## 2016-09-17 LAB — GC/CHLAMYDIA PROBE AMP
CHLAMYDIA, DNA PROBE: NEGATIVE
Neisseria gonorrhoeae by PCR: NEGATIVE

## 2016-09-17 LAB — HIV ANTIBODY (ROUTINE TESTING W REFLEX): HIV SCREEN 4TH GENERATION: NONREACTIVE

## 2016-09-21 ENCOUNTER — Other Ambulatory Visit: Payer: Self-pay | Admitting: Unknown Physician Specialty

## 2016-09-21 ENCOUNTER — Telehealth: Payer: Self-pay | Admitting: Unknown Physician Specialty

## 2016-09-21 DIAGNOSIS — Z7251 High risk heterosexual behavior: Secondary | ICD-10-CM

## 2016-09-21 NOTE — Progress Notes (Signed)
Notified pt by mychart and by phone

## 2016-09-21 NOTE — Telephone Encounter (Signed)
Discussed with pt labs.  Pts partner would like a Hep C.  This was not done.  Will check on as an add on.  Discussed HSV.  Will talk with partner about prevention medications.  He is asymptomatic

## 2016-09-22 ENCOUNTER — Other Ambulatory Visit: Payer: Self-pay

## 2016-09-23 MED ORDER — PANTOPRAZOLE SODIUM 40 MG PO TBEC: 40.0000 mg | DELAYED_RELEASE_TABLET | Freq: Every day | ORAL | 3 refills | Status: DC | PRN

## 2016-10-13 LAB — SPECIMEN STATUS REPORT

## 2016-10-13 LAB — HEPATITIS C ANTIBODY: Hep C Virus Ab: <0.1 s/co ratio (ref 0.0–0.9)

## 2016-10-26 DIAGNOSIS — Z23 Encounter for immunization: Secondary | ICD-10-CM | POA: Diagnosis not present

## 2017-02-24 ENCOUNTER — Encounter: Payer: Self-pay | Admitting: Unknown Physician Specialty

## 2017-02-24 ENCOUNTER — Ambulatory Visit: Payer: Medicare HMO | Admitting: Unknown Physician Specialty

## 2017-02-24 ENCOUNTER — Ambulatory Visit (INDEPENDENT_AMBULATORY_CARE_PROVIDER_SITE_OTHER): Payer: Medicare HMO | Admitting: Unknown Physician Specialty

## 2017-02-24 DIAGNOSIS — K219 Gastro-esophageal reflux disease without esophagitis: Secondary | ICD-10-CM

## 2017-02-24 DIAGNOSIS — E782 Mixed hyperlipidemia: Secondary | ICD-10-CM

## 2017-02-24 DIAGNOSIS — I10 Essential (primary) hypertension: Secondary | ICD-10-CM

## 2017-02-24 DIAGNOSIS — Z720 Tobacco use: Secondary | ICD-10-CM | POA: Diagnosis not present

## 2017-02-24 DIAGNOSIS — M1612 Unilateral primary osteoarthritis, left hip: Secondary | ICD-10-CM | POA: Diagnosis not present

## 2017-02-24 NOTE — Assessment & Plan Note (Signed)
Stable, continue present medications.   

## 2017-02-24 NOTE — Assessment & Plan Note (Signed)
Set up for low dose CT

## 2017-02-24 NOTE — Assessment & Plan Note (Signed)
OK for occasional Meloxicam

## 2017-02-24 NOTE — Progress Notes (Addendum)
BP 135/78 (BP Location: Left Arm, Cuff Size: Large)   Pulse (!) 50   Temp 97.9 F (36.6 C) (Oral)   Ht 5\' 11"  (1.803 m)   Wt 215 lb 6.4 oz (97.7 kg)   SpO2 100%   BMI 30.04 kg/m    Subjective:    Patient ID: Matthew Randolph, male    DOB: Jul 09, 1940, 77 y.o.   MRN: [id number]  HPI: Matthew Randolph is a 77 y.o. male  Chief Complaint  Patient presents with  . Hyperlipidemia  . Hypertension   Hypertension Using medications without difficulty Average home BPs Not checking   No problems or lightheadedness No chest pain with exertion or shortness of breath No Edema  Hyperlipidemia Using medications without problems: No Muscle aches  Diet compliance:Exercise:Not exercising.  Walking in the house.  States diet is "pretty good."    GERD No SOB, Belching, brash, heartburn or epigastric pain  Left hip OA Takes Meloxicam as needed.  Aches more with the rain  Relevant past medical, surgical, family and social history reviewed and updated as indicated. Interim medical history since our last visit reviewed. Allergies and medications reviewed and updated.  Review of Systems  Constitutional: Negative.   HENT: Negative.   Eyes: Negative.   Respiratory: Negative.   Cardiovascular: Negative.   Gastrointestinal:       Miralax on occasion  Genitourinary: Negative.   Psychiatric/Behavioral: Negative.     Per HPI unless specifically indicated above     Objective:    BP 135/78 (BP Location: Left Arm, Cuff Size: Large)   Pulse (!) 50   Temp 97.9 F (36.6 C) (Oral)   Ht 5\' 11"  (1.803 m)   Wt 215 lb 6.4 oz (97.7 kg)   SpO2 100%   BMI 30.04 kg/m   Wt Readings from Last 3 Encounters:  02/24/17 215 lb 6.4 oz (97.7 kg)  08/19/16 212 lb 9.6 oz (96.4 kg)  07/29/16 211 lb 6.4 oz (95.9 kg)    Physical Exam  Constitutional: He is oriented to person, place, and time. He appears well-developed and well-nourished. No distress.  HENT:  Head: Normocephalic and atraumatic.  Eyes:  Conjunctivae and lids are normal. Right eye exhibits no discharge. Left eye exhibits no discharge. No scleral icterus.  Neck: Normal range of motion. Neck supple. No JVD present. Carotid bruit is not present.  Cardiovascular: Normal rate, regular rhythm and normal heart sounds.  Pulmonary/Chest: Effort normal and breath sounds normal. No respiratory distress.  Abdominal: Normal appearance. There is no splenomegaly or hepatomegaly.  Musculoskeletal: Normal range of motion.  Neurological: He is alert and oriented to person, place, and time.  Skin: Skin is warm, dry and intact. No rash noted. No pallor.  Psychiatric: He has a normal mood and affect. His behavior is normal. Judgment and thought content normal.     Assessment & Plan:   Problem List Items Addressed This Visit      Unprioritized   Benign essential HTN    Stable, continue present medications.        Relevant Orders   Comprehensive metabolic panel   GERD (gastroesophageal reflux disease)    Stable, continue present medications.        Hyperlipemia    Stable, continue present medications.        Osteoarthritis of left hip    OK for occasional Meloxicam      Tobacco abuse    Set up for low dose CT  Follow up plan: Return in about 6 months (around 08/24/2017) for physical.

## 2017-02-25 ENCOUNTER — Telehealth: Payer: Self-pay | Admitting: *Deleted

## 2017-02-25 NOTE — Telephone Encounter (Signed)
Received referral for low dose lung cancer screening CT scan. Message left at phone number listed in EMR for patient to call me back to facilitate scheduling scan.  

## 2017-03-01 ENCOUNTER — Other Ambulatory Visit: Payer: Medicare HMO

## 2017-03-01 DIAGNOSIS — R69 Illness, unspecified: Secondary | ICD-10-CM | POA: Diagnosis not present

## 2017-03-01 DIAGNOSIS — Z7251 High risk heterosexual behavior: Secondary | ICD-10-CM

## 2017-03-02 LAB — HEPATITIS C ANTIBODY: Hep C Virus Ab: <0.1 s/co ratio (ref 0.0–0.9)

## 2017-03-07 ENCOUNTER — Other Ambulatory Visit: Payer: Self-pay | Admitting: Unknown Physician Specialty

## 2017-03-09 ENCOUNTER — Encounter: Payer: Self-pay | Admitting: Unknown Physician Specialty

## 2017-03-10 ENCOUNTER — Telehealth: Payer: Self-pay | Admitting: *Deleted

## 2017-03-10 NOTE — Telephone Encounter (Signed)
Received referral for low dose lung cancer screening CT scan. Message left at phone number listed in EMR for patient to call me back to facilitate scheduling scan.  

## 2017-03-11 ENCOUNTER — Telehealth: Payer: Self-pay

## 2017-03-11 NOTE — Telephone Encounter (Signed)
Called and left patient a VM asking for him to please come in for lab draw between 8 and 11:30 or 1 and 4:30.

## 2017-03-11 NOTE — Telephone Encounter (Signed)
-----   Message from Kathrine Haddock, NP sent at 03/10/2017  9:08 AM EST ----- Regarding: CMP There seems to be a lack of communication between my computer and the lab's computer as to his CMP.  Would he be willing to come in and have a CMP drawn?

## 2017-03-17 ENCOUNTER — Other Ambulatory Visit: Payer: Medicare HMO

## 2017-03-17 DIAGNOSIS — I10 Essential (primary) hypertension: Secondary | ICD-10-CM

## 2017-03-18 LAB — COMPREHENSIVE METABOLIC PANEL
ALT: 22 IU/L (ref 0–44)
AST: 22 IU/L (ref 0–40)
Albumin/Globulin Ratio: 1.4 (ref 1.2–2.2)
Albumin: 4.2 g/dL (ref 3.5–4.8)
Alkaline Phosphatase: 57 IU/L (ref 39–117)
BUN/Creatinine Ratio: 11 (ref 10–24)
BUN: 11 mg/dL (ref 8–27)
Bilirubin Total: 0.6 mg/dL (ref 0.0–1.2)
CALCIUM: 9.8 mg/dL (ref 8.6–10.2)
CO2: 22 mmol/L (ref 20–29)
CREATININE: 0.99 mg/dL (ref 0.76–1.27)
Chloride: 106 mmol/L (ref 96–106)
GFR calc non Af Amer: 75 mL/min/{1.73_m2} (ref >59)
GFR, EST AFRICAN AMERICAN: 86 mL/min/{1.73_m2} (ref >59)
Globulin, Total: 3 g/dL (ref 1.5–4.5)
Glucose: 86 mg/dL (ref 65–99)
POTASSIUM: 4.9 mmol/L (ref 3.5–5.2)
Sodium: 143 mmol/L (ref 134–144)
TOTAL PROTEIN: 7.2 g/dL (ref 6.0–8.5)

## 2017-03-21 ENCOUNTER — Other Ambulatory Visit: Payer: Self-pay | Admitting: Unknown Physician Specialty

## 2017-04-22 ENCOUNTER — Encounter: Payer: Self-pay | Admitting: *Deleted

## 2017-05-05 ENCOUNTER — Other Ambulatory Visit: Payer: Self-pay | Admitting: Unknown Physician Specialty

## 2017-07-05 DIAGNOSIS — M87051 Idiopathic aseptic necrosis of right femur: Secondary | ICD-10-CM | POA: Diagnosis not present

## 2017-07-05 DIAGNOSIS — M25551 Pain in right hip: Secondary | ICD-10-CM | POA: Diagnosis not present

## 2017-07-05 DIAGNOSIS — M87052 Idiopathic aseptic necrosis of left femur: Secondary | ICD-10-CM | POA: Diagnosis not present

## 2017-07-05 DIAGNOSIS — G8929 Other chronic pain: Secondary | ICD-10-CM | POA: Diagnosis not present

## 2017-07-22 ENCOUNTER — Encounter (INDEPENDENT_AMBULATORY_CARE_PROVIDER_SITE_OTHER): Payer: Self-pay

## 2017-08-05 ENCOUNTER — Encounter: Payer: Medicare HMO | Admitting: Family Medicine

## 2017-08-05 ENCOUNTER — Ambulatory Visit (INDEPENDENT_AMBULATORY_CARE_PROVIDER_SITE_OTHER): Payer: Medicare HMO

## 2017-08-05 VITALS — BP 122/70 | HR 42 | Temp 98.1°F | Resp 16 | Ht 72.0 in | Wt 212.8 lb

## 2017-08-05 DIAGNOSIS — Z0000 Encounter for general adult medical examination without abnormal findings: Secondary | ICD-10-CM

## 2017-08-05 NOTE — Patient Instructions (Addendum)
Matthew Randolph , Thank you for taking time to come for your Medicare Wellness Visit. I appreciate your ongoing commitment to your health goals. Please review the following plan we discussed and let me know if I can assist you in the future.   Screening recommendations/referrals: Colonoscopy: completed 11/17/2011 Recommended yearly ophthalmology/optometry visit for glaucoma screening and checkup Recommended yearly dental visit for hygiene and checkup  Vaccinations: Influenza vaccine: due 09/2017 Pneumococcal vaccine: completed series Tdap vaccine: up to date  Shingles vaccine: shingrix eligible, check with your insurance company for coverage   Advanced directives: Advance directive discussed with you today. I have provided a copy for you to complete at home and have notarized. Once this is complete please bring a copy in to our office so we can scan it into your chart.  Conditions/risks identified: Smoking cessation discussed  Next appointment: Follow up in one year for your annual wellness exam.   Preventive Care 65 Years and Older, Male Preventive care refers to lifestyle choices and visits with your health care provider that can promote health and wellness. What does preventive care include?  A yearly physical exam. This is also called an annual well check.  Dental exams once or twice a year.  Routine eye exams. Ask your health care provider how often you should have your eyes checked.  Personal lifestyle choices, including:  Daily care of your teeth and gums.  Regular physical activity.  Eating a healthy diet.  Avoiding tobacco and drug use.  Limiting alcohol use.  Practicing safe sex.  Taking low doses of aspirin every day.  Taking vitamin and mineral supplements as recommended by your health care provider. What happens during an annual well check? The services and screenings done by your health care provider during your annual well check will depend on your age, overall  health, lifestyle risk factors, and family history of disease. Counseling  Your health care provider may ask you questions about your:  Alcohol use.  Tobacco use.  Drug use.  Emotional well-being.  Home and relationship well-being.  Sexual activity.  Eating habits.  History of falls.  Memory and ability to understand (cognition).  Work and work Statistician. Screening  You may have the following tests or measurements:  Height, weight, and BMI.  Blood pressure.  Lipid and cholesterol levels. These may be checked every 5 years, or more frequently if you are over 73 years old.  Skin check.  Lung cancer screening. You may have this screening every year starting at age 57 if you have a 30-pack-year history of smoking and currently smoke or have quit within the past 15 years.  Fecal occult blood test (FOBT) of the stool. You may have this test every year starting at age 58.  Flexible sigmoidoscopy or colonoscopy. You may have a sigmoidoscopy every 5 years or a colonoscopy every 10 years starting at age 79.  Prostate cancer screening. Recommendations will vary depending on your family history and other risks.  Hepatitis C blood test.  Hepatitis B blood test.  Sexually transmitted disease (STD) testing.  Diabetes screening. This is done by checking your blood sugar (glucose) after you have not eaten for a while (fasting). You may have this done every 1-3 years.  Abdominal aortic aneurysm (AAA) screening. You may need this if you are a current or former smoker.  Osteoporosis. You may be screened starting at age 43 if you are at high risk. Talk with your health care provider about your test results, treatment options,  and if necessary, the need for more tests. Vaccines  Your health care provider may recommend certain vaccines, such as:  Influenza vaccine. This is recommended every year.  Tetanus, diphtheria, and acellular pertussis (Tdap, Td) vaccine. You may need a Td  booster every 10 years.  Zoster vaccine. You may need this after age 66.  Pneumococcal 13-valent conjugate (PCV13) vaccine. One dose is recommended after age 55.  Pneumococcal polysaccharide (PPSV23) vaccine. One dose is recommended after age 13. Talk to your health care provider about which screenings and vaccines you need and how often you need them. This information is not intended to replace advice given to you by your health care provider. Make sure you discuss any questions you have with your health care provider. Document Released: 01/18/2015 Document Revised: 09/11/2015 Document Reviewed: 10/23/2014 Elsevier Interactive Patient Education  2017 [place] Prevention in the Home Falls can cause injuries. They can happen to people of all ages. There are many things you can do to make your home safe and to help prevent falls. What can I do on the outside of my home?  Regularly fix the edges of walkways and driveways and fix any cracks.  Remove anything that might make you trip as you walk through a door, such as a raised step or threshold.  Trim any bushes or trees on the path to your home.  Use bright outdoor lighting.  Clear any walking paths of anything that might make someone trip, such as rocks or tools.  Regularly check to see if handrails are loose or broken. Make sure that both sides of any steps have handrails.  Any raised decks and porches should have guardrails on the edges.  Have any leaves, snow, or ice cleared regularly.  Use sand or salt on walking paths during winter.  Clean up any spills in your garage right away. This includes oil or grease spills. What can I do in the bathroom?  Use night lights.  Install grab bars by the toilet and in the tub and shower. Do not use towel bars as grab bars.  Use non-skid mats or decals in the tub or shower.  If you need to sit down in the shower, use a plastic, non-slip stool.  Keep the floor dry. Clean up  any water that spills on the floor as soon as it happens.  Remove soap buildup in the tub or shower regularly.  Attach bath mats securely with double-sided non-slip rug tape.  Do not have throw rugs and other things on the floor that can make you trip. What can I do in the bedroom?  Use night lights.  Make sure that you have a light by your bed that is easy to reach.  Do not use any sheets or blankets that are too big for your bed. They should not hang down onto the floor.  Have a firm chair that has side arms. You can use this for support while you get dressed.  Do not have throw rugs and other things on the floor that can make you trip. What can I do in the kitchen?  Clean up any spills right away.  Avoid walking on wet floors.  Keep items that you use a lot in easy-to-reach places.  If you need to reach something above you, use a strong step stool that has a grab bar.  Keep electrical cords out of the way.  Do not use floor polish or wax that makes floors slippery. If  you must use wax, use non-skid floor wax.  Do not have throw rugs and other things on the floor that can make you trip. What can I do with my stairs?  Do not leave any items on the stairs.  Make sure that there are handrails on both sides of the stairs and use them. Fix handrails that are broken or loose. Make sure that handrails are as long as the stairways.  Check any carpeting to make sure that it is firmly attached to the stairs. Fix any carpet that is loose or worn.  Avoid having throw rugs at the top or bottom of the stairs. If you do have throw rugs, attach them to the floor with carpet tape.  Make sure that you have a light switch at the top of the stairs and the bottom of the stairs. If you do not have them, ask someone to add them for you. What else can I do to help prevent falls?  Wear shoes that:  Do not have high heels.  Have rubber bottoms.  Are comfortable and fit you well.  Are  closed at the toe. Do not wear sandals.  If you use a stepladder:  Make sure that it is fully opened. Do not climb a closed stepladder.  Make sure that both sides of the stepladder are locked into place.  Ask someone to hold it for you, if possible.  Clearly mark and make sure that you can see:  Any grab bars or handrails.  First and last steps.  Where the edge of each step is.  Use tools that help you move around (mobility aids) if they are needed. These include:  Canes.  Walkers.  Scooters.  Crutches.  Turn on the lights when you go into a dark area. Replace any light bulbs as soon as they burn out.  Set up your furniture so you have a clear path. Avoid moving your furniture around.  If any of your floors are uneven, fix them.  If there are any pets around you, be aware of where they are.  Review your medicines with your doctor. Some medicines can make you feel dizzy. This can increase your chance of falling. Ask your doctor what other things that you can do to help prevent falls. This information is not intended to replace advice given to you by your health care provider. Make sure you discuss any questions you have with your health care provider. Document Released: 10/18/2008 Document Revised: 05/30/2015 Document Reviewed: 01/26/2014 Elsevier Interactive Patient Education  2017 [employer].

## 2017-08-05 NOTE — Progress Notes (Signed)
Subjective:   Matthew Randolph is a 77 y.o. male who presents for Medicare Annual/Subsequent preventive examination.  Review of Systems:   Cardiac Risk Factors include: advanced age (>33men, >26 women);dyslipidemia;hypertension;male gender;smoking/ tobacco exposure     Objective:    Vitals: BP 122/70 (BP Location: Left Arm, Patient Position: Sitting)   Pulse (!) 42   Temp 98.1 F (36.7 C) (Temporal)   Resp 16   Ht 6' (1.829 m)   Wt 212 lb 12.8 oz (96.5 kg)   SpO2 98%   BMI 28.86 kg/m   Body mass index is 28.86 kg/m.  Advanced Directives 08/05/2017 07/29/2016  Does Patient Have a Medical Advance Directive? No Yes  Type of Advance Directive - Faith;Living will  Copy of [place] in Chart? - No - copy requested  Would patient like information on creating a medical advance directive? Yes (MAU/Ambulatory/Procedural Areas - Information given) -    Tobacco Social History   Tobacco Use  Smoking Status Current Every Day Smoker  . Packs/day: 0.25  . Types: Cigarettes  Smokeless Tobacco Never Used     Ready to quit: Yes Counseling given: Yes   Clinical Intake:  Pre-visit preparation completed: Yes  Pain : 0-10 Pain Score: 7  Pain Type: Chronic pain Pain Location: Hip Pain Orientation: Right Pain Descriptors / Indicators: Aching Pain Onset: More than a month ago Pain Frequency: Constant     Nutritional Status: BMI 25 -29 Overweight Nutritional Risks: None Diabetes: No  How often do you need to have someone help you when you read instructions, pamphlets, or other written materials from your doctor or pharmacy?: 1 - Never What is the last grade level you completed in school?: 12th grade  Interpreter Needed?: No  Information entered by :: Albana Saperstein,LPN   Past Medical History:  Diagnosis Date  . Chronic kidney disease   . ED (erectile dysfunction)   . Erectile dysfunction   . High risk sexual behavior   .  Hyperlipidemia   . Hypertension   . Tobacco use disorder, continuous    Past Surgical History:  Procedure Laterality Date  . PITUITARY SURGERY  12/10/2015   Family History  Problem Relation Age of Onset  . Brain cancer Mother   . Cancer Mother        Brain tumor  . Thyroid disease Mother   . Lung disease Mother        From [place]  . Heart disease Father   . Stroke Father   . Hypertension Brother   . Aneurysm Brother   . Pneumonia Brother   . Heart disease Sister        massive MI  . Obesity Sister    Social History   Socioeconomic History  . Marital status: Significant Other    Spouse name: Not on file  . Number of children: 4  . Years of education: 12th  . Highest education level: High school graduate  Occupational History  . Occupation: Retired [employer]  . Financial resource strain: Not hard at all  . Food insecurity:    Worry: Never true    Inability: Never true  . Transportation needs:    Medical: No    Non-medical: No  Tobacco Use  . Smoking status: Current Every Day Smoker    Packs/day: 0.25    Types: Cigarettes  . Smokeless tobacco: Never Used  Substance and Sexual Activity  . Alcohol use: No  Alcohol/week: 0.0 oz    Comment: occ.  . Drug use: No  . Sexual activity: Yes    Partners: Female  Lifestyle  . Physical activity:    Days per week: 0 days    Minutes per session: 0 min  . Stress: Not at all  Relationships  . Social connections:    Talks on phone: More than three times a week    Gets together: More than three times a week    Attends religious service: Never    Active member of club or organization: No    Attends meetings of clubs or organizations: Never    Relationship status: Living with partner  Other Topics Concern  . Not on file  Social History Narrative   ** Merged History Encounter **        Outpatient Encounter Medications as of 08/05/2017  Medication Sig  . atorvastatin (LIPITOR) 10 MG tablet TAKE 1  TABLET DAILY  . lisinopril (PRINIVIL,ZESTRIL) 5 MG tablet TAKE 1 TABLET DAILY  . meloxicam (MOBIC) 15 MG tablet TAKE 1 TABLET DAILY  . Multiple Vitamin (MULTIVITAMIN) tablet Take 1 tablet by mouth daily.  . pantoprazole (PROTONIX) 40 MG tablet Take 1 tablet (40 mg total) by mouth daily as needed.   No facility-administered encounter medications on file as of 08/05/2017.     Activities of Daily Living In your present state of health, do you have any difficulty performing the following activities: 08/05/2017  Hearing? N  Vision? Y  Comment some difficulties   Difficulty concentrating or making decisions? N  Walking or climbing stairs? N  Dressing or bathing? N  Doing errands, shopping? N  Preparing Food and eating ? N  Using the Toilet? N  In the past six months, have you accidently leaked urine? N  Do you have problems with loss of bowel control? N  Managing your Medications? N  Managing your Finances? N  Housekeeping or managing your Housekeeping? N  Some recent data might be hidden    Patient Care Team: Kathrine Haddock, NP as PCP - General (Nurse Practitioner) Derrill Memo, MD as Referring Physician (Neurosurgery)   Assessment:   This is a routine wellness examination for Matthew Randolph.  Exercise Activities and Dietary recommendations Current Exercise Habits: The patient does not participate in regular exercise at present, Exercise limited by: None identified  Goals    . Quit Smoking     Smoking cessation discussed       Fall Risk Fall Risk  08/05/2017 07/29/2016 02/14/2016 09/26/2014  Falls in the past year? No No No No   Is the patient's home free of loose throw rugs in walkways, pet beds, electrical cords, etc?   yes      Grab bars in the bathroom? yes      Handrails on the stairs?   yes      Adequate lighting?   yes  Timed Get Up and Go Performed: Completed in 8 seconds with no use of assistive devices, steady gait. No intervention needed at this time.   Depression  Screen PHQ 2/9 Scores 08/05/2017 07/29/2016 02/14/2016 09/26/2014  PHQ - 2 Score 0 0 0 0    Cognitive Function     6CIT Screen 08/05/2017 07/29/2016  What Year? 0 points 0 points  What month? 0 points 0 points  What time? 0 points 0 points  Count back from 20 0 points 0 points  Months in reverse 0 points 0 points  Repeat phrase 2 points 6 points  Total Score 2 6    Immunization History  Administered Date(s) Administered  . Influenza, High Dose Seasonal PF 10/26/2016  . Influenza-Unspecified 10/15/2015  . Pneumococcal Conjugate-13 12/13/2013  . Pneumococcal Polysaccharide-23 04/10/2008  . Pneumococcal-Unspecified 04/10/2008  . Td 10/05/2005, 10/05/2005, 08/19/2016  . Zoster 04/10/2008    Qualifies for Shingles Vaccine? Yes, discussed shingrix vaccine   Screening Tests Health Maintenance  Topic Date Due  . INFLUENZA VACCINE  08/05/2017  . COLONOSCOPY  11/16/2021  . TETANUS/TDAP  08/20/2026  . PNA vac Low Risk Adult  Completed   Cancer Screenings: Lung: Low Dose CT Chest recommended if Age 50-80 years, 30 pack-year currently smoking OR have quit w/in 15years. Patient does not qualify. Colorectal: completed 11/17/2011  Additional Screenings:  Hepatitis C Screening: not indicated       Plan:    I have personally reviewed and addressed the Medicare Annual Wellness questionnaire and have noted the following in the patient's chart:  A. Medical and social history B. Use of alcohol, tobacco or illicit drugs  C. Current medications and supplements D. Functional ability and status E.  Nutritional status F.  Physical activity G. Advance directives H. List of other physicians I.  Hospitalizations, surgeries, and ER visits in previous 12 months [place] such as hearing and vision if needed, cognitive and depression L. Referrals and appointments   In addition, I have reviewed and discussed with patient certain preventive protocols, quality metrics, and best  practice recommendations. A written personalized care plan for preventive services as well as general preventive health recommendations were provided to patient.   Signed,  Tyler Aas, LPN Nurse Health Advisor   Nurse Notes:none

## 2017-08-20 ENCOUNTER — Ambulatory Visit (INDEPENDENT_AMBULATORY_CARE_PROVIDER_SITE_OTHER): Payer: Medicare HMO | Admitting: Family Medicine

## 2017-08-20 ENCOUNTER — Other Ambulatory Visit: Payer: Self-pay

## 2017-08-20 ENCOUNTER — Encounter: Payer: Self-pay | Admitting: Family Medicine

## 2017-08-20 VITALS — BP 132/73 | HR 70 | Temp 98.7°F | Ht 70.0 in | Wt 209.6 lb

## 2017-08-20 DIAGNOSIS — E782 Mixed hyperlipidemia: Secondary | ICD-10-CM

## 2017-08-20 DIAGNOSIS — I10 Essential (primary) hypertension: Secondary | ICD-10-CM | POA: Diagnosis not present

## 2017-08-20 DIAGNOSIS — Z0000 Encounter for general adult medical examination without abnormal findings: Secondary | ICD-10-CM | POA: Diagnosis not present

## 2017-08-20 DIAGNOSIS — R972 Elevated prostate specific antigen [PSA]: Secondary | ICD-10-CM

## 2017-08-20 DIAGNOSIS — M1612 Unilateral primary osteoarthritis, left hip: Secondary | ICD-10-CM | POA: Diagnosis not present

## 2017-08-20 LAB — UA/M W/RFLX CULTURE, ROUTINE
BILIRUBIN UA: NEGATIVE
GLUCOSE, UA: NEGATIVE
Ketones, UA: NEGATIVE
NITRITE UA: NEGATIVE
PH UA: 6 (ref 5.0–7.5)
RBC UA: NEGATIVE
Specific Gravity, UA: 1.015 (ref 1.005–1.030)
UUROB: 1 mg/dL (ref 0.2–1.0)

## 2017-08-20 LAB — MICROSCOPIC EXAMINATION: BACTERIA UA: NONE SEEN

## 2017-08-20 NOTE — Progress Notes (Signed)
BP 132/73   Pulse 70   Temp 98.7 F (37.1 C) (Oral)   Ht 5\' 10"  (1.778 m)   Wt 209 lb 9.6 oz (95.1 kg)   SpO2 96%   BMI 30.07 kg/m    Subjective:    Patient ID: Matthew Randolph, male    DOB: 19-Jun-1940, 77 y.o.   MRN: [id number]  HPI: Matthew Randolph is a 77 y.o. male presenting on 08/20/2017 for comprehensive medical examination. Current medical complaints include:see below  Takes meloxicam about every other day for his multi-joint arthritis. Sometimes tylenol as well if it's severe. Wanting a hip injection in left hip for arthritis. Working with orthopedics currently for his right hip and looking at a hip replacement.   BPs under good control with 5 mg lisinopril. Compliant with regimen, no side effects. Takes lipitor for cholesterol control. Denies Cp, SOB, claudication, myalgias. Tries to watch his diet. Not as active as he would like to be due to hip and knee pain.   He currently lives with: Interim Problems from his last visit: no  Depression Screen done today and results listed below:  Depression screen [facility] 2/9 08/20/2017 08/05/2017 07/29/2016 02/14/2016 09/26/2014  Decreased Interest 0 0 0 0 0  Down, Depressed, Hopeless 0 0 0 0 0  PHQ - 2 Score 0 0 0 0 0  Altered sleeping 0 - - - -  Tired, decreased energy 0 - - - -  Change in appetite 0 - - - -  Feeling bad or failure about yourself  0 - - - -  Trouble concentrating 0 - - - -  Moving slowly or fidgety/restless 0 - - - -  Suicidal thoughts 0 - - - -  PHQ-9 Score 0 - - - -    The patient does not have a history of falls. I did not complete a risk assessment for falls. A plan of care for falls was not documented.   Past Medical History:  Past Medical History:  Diagnosis Date  . Chronic kidney disease   . ED (erectile dysfunction)   . Erectile dysfunction   . High risk sexual behavior   . Hyperlipidemia   . Hypertension   . Tobacco use disorder, continuous     Surgical History:  Past Surgical History:  Procedure  Laterality Date  . PITUITARY SURGERY  12/10/2015    Medications:  Current Outpatient Medications on File Prior to Visit  Medication Sig  . atorvastatin (LIPITOR) 10 MG tablet TAKE 1 TABLET DAILY  . lisinopril (PRINIVIL,ZESTRIL) 5 MG tablet TAKE 1 TABLET DAILY  . meloxicam (MOBIC) 15 MG tablet TAKE 1 TABLET DAILY  . Multiple Vitamin (MULTIVITAMIN) tablet Take 1 tablet by mouth daily.  . pantoprazole (PROTONIX) 40 MG tablet Take 1 tablet (40 mg total) by mouth daily as needed.   No current facility-administered medications on file prior to visit.     Allergies:  No Known Allergies  Social History:  Social History   Socioeconomic History  . Marital status: Significant Other    Spouse name: Not on file  . Number of children: 4  . Years of education: 12th  . Highest education level: High school graduate  Occupational History  . Occupation: Retired [employer]  . Financial resource strain: Not hard at all  . Food insecurity:    Worry: Never true    Inability: Never true  . Transportation needs:    Medical: No    Non-medical: No  Tobacco Use  . Smoking status: Current Every Day Smoker    Packs/day: 0.25    Types: Cigarettes  . Smokeless tobacco: Never Used  Substance and Sexual Activity  . Alcohol use: No    Alcohol/week: 0.0 standard drinks    Comment: occ.  . Drug use: No  . Sexual activity: Yes    Partners: Female  Lifestyle  . Physical activity:    Days per week: 0 days    Minutes per session: 0 min  . Stress: Not at all  Relationships  . Social connections:    Talks on phone: More than three times a week    Gets together: More than three times a week    Attends religious service: Never    Active member of club or organization: No    Attends meetings of clubs or organizations: Never    Relationship status: Living with partner  . Intimate partner violence:    Fear of current or ex partner: No    Emotionally abused: No    Physically abused:  No    Forced sexual activity: No  Other Topics Concern  . Not on file  Social History Narrative   ** Merged History Encounter **       Social History   Tobacco Use  Smoking Status Current Every Day Smoker  . Packs/day: 0.25  . Types: Cigarettes  Smokeless Tobacco Never Used   Social History   Substance and Sexual Activity  Alcohol Use No  . Alcohol/week: 0.0 standard drinks   Comment: occ.    Family History:  Family History  Problem Relation Age of Onset  . Brain cancer Mother   . Cancer Mother        Brain tumor  . Thyroid disease Mother   . Lung disease Mother        From [place]  . Heart disease Father   . Stroke Father   . Hypertension Brother   . Aneurysm Brother   . Pneumonia Brother   . Heart disease Sister        massive MI  . Obesity Sister     Past medical history, surgical history, medications, allergies, family history and social history reviewed with patient today and changes made to appropriate areas of the chart.   Review of Systems - General ROS: negative Psychological ROS: negative Ophthalmic ROS: negative ENT ROS: negative Allergy and Immunology ROS: negative Hematological and Lymphatic ROS: negative Respiratory ROS: no cough, shortness of breath, or wheezing Cardiovascular ROS: no chest pain or dyspnea on exertion Gastrointestinal ROS: no abdominal pain, change in bowel habits, or black or bloody stools Genito-Urinary ROS: no dysuria, trouble voiding, or hematuria Musculoskeletal ROS: positive for - joint pain and joint stiffness Neurological ROS: no TIA or stroke symptoms Dermatological ROS: negative All other ROS negative except what is listed above and in the HPI.      Objective:    BP 132/73   Pulse 70   Temp 98.7 F (37.1 C) (Oral)   Ht 5\' 10"  (1.778 m)   Wt 209 lb 9.6 oz (95.1 kg)   SpO2 96%   BMI 30.07 kg/m   Wt Readings from Last 3 Encounters:  08/20/17 209 lb 9.6 oz (95.1 kg)  08/05/17 212 lb 12.8 oz (96.5 kg)    02/24/17 215 lb 6.4 oz (97.7 kg)    Physical Exam  Constitutional: He is oriented to person, place, and time. He appears well-developed and well-nourished. No distress.  HENT:  Head: Atraumatic.  Right Ear: External ear normal.  Left Ear: External ear normal.  Nose: Nose normal.  Mouth/Throat: Oropharynx is clear and moist.  Eyes: Pupils are equal, round, and reactive to light. Conjunctivae are normal. No scleral icterus.  Neck: Normal range of motion. Neck supple.  Cardiovascular: Normal rate, regular rhythm, normal heart sounds and intact distal pulses.  No murmur heard. Pulmonary/Chest: Effort normal and breath sounds normal. No respiratory distress.  Abdominal: Soft. Bowel sounds are normal. He exhibits no distension and no mass. There is no tenderness. There is no guarding.  Genitourinary:  Genitourinary Comments: GU exam declined  Musculoskeletal: Normal range of motion. He exhibits no edema or tenderness.  Neurological: He is alert and oriented to person, place, and time. He has normal reflexes.  Skin: Skin is warm and dry. No rash noted.  Psychiatric: He has a normal mood and affect. His behavior is normal.  Nursing note and vitals reviewed.   Results for orders placed or performed in visit on 08/20/17  Microscopic Examination  Result Value Ref Range   WBC, UA 0-5 0 - 5 /hpf   RBC, UA 0-2 0 - 2 /hpf   Epithelial Cells (non renal) 0-10 0 - 10 /hpf   Casts Present None seen /lpf   Cast Type White cell casts (A) N/A   Bacteria, UA None seen None seen/Few  CBC with Differential/Platelet  Result Value Ref Range   WBC 6.7 3.4 - 10.8 x10E3/uL   RBC 4.65 4.14 - 5.80 x10E6/uL   Hemoglobin 12.5 (L) 13.0 - 17.7 g/dL   Hematocrit 40.4 37.5 - 51.0 %   MCV 87 79 - 97 fL   MCH 26.9 26.6 - 33.0 pg   MCHC 30.9 (L) 31.5 - 35.7 g/dL   RDW 14.1 12.3 - 15.4 %   Platelets 328 150 - 450 x10E3/uL   Neutrophils 51 Not Estab. %   Lymphs 40 Not Estab. %   Monocytes 7 Not Estab. %   Eos  1 Not Estab. %   Basos 1 Not Estab. %   Neutrophils Absolute 3.4 1.4 - 7.0 x10E3/uL   Lymphocytes Absolute 2.7 0.7 - 3.1 x10E3/uL   Monocytes Absolute 0.5 0.1 - 0.9 x10E3/uL   EOS (ABSOLUTE) 0.1 0.0 - 0.4 x10E3/uL   Basophils Absolute 0.0 0.0 - 0.2 x10E3/uL   Immature Granulocytes 0 Not Estab. %   Immature Grans (Abs) 0.0 0.0 - 0.1 x10E3/uL  Comprehensive metabolic panel  Result Value Ref Range   Glucose 82 65 - 99 mg/dL   BUN 13 8 - 27 mg/dL   Creatinine, Ser 1.15 0.76 - 1.27 mg/dL   GFR calc non Af Amer 62 >59 mL/min/1.73   GFR calc Af Amer 72 >59 mL/min/1.73   BUN/Creatinine Ratio 11 10 - 24   Sodium 144 134 - 144 mmol/L   Potassium 4.6 3.5 - 5.2 mmol/L   Chloride 102 96 - 106 mmol/L   CO2 17 (L) 20 - 29 mmol/L   Calcium 9.5 8.6 - 10.2 mg/dL   Total Protein 7.8 6.0 - 8.5 g/dL   Albumin 4.4 3.5 - 4.8 g/dL   Globulin, Total 3.4 1.5 - 4.5 g/dL   Albumin/Globulin Ratio 1.3 1.2 - 2.2   Bilirubin Total 0.4 0.0 - 1.2 mg/dL   Alkaline Phosphatase 59 39 - 117 IU/L   AST 20 0 - 40 IU/L   ALT 22 0 - 44 IU/L  Lipid Panel w/o Chol/HDL Ratio  Result Value Ref Range  Cholesterol, Total 168 100 - 199 mg/dL   Triglycerides 159 (H) 0 - 149 mg/dL   HDL 51 >39 mg/dL   VLDL Cholesterol Cal 32 5 - 40 mg/dL   LDL Calculated 85 0 - 99 mg/dL  UA/M w/rflx Culture, Routine  Result Value Ref Range   Specific Gravity, UA 1.015 1.005 - 1.030   pH, UA 6.0 5.0 - 7.5   Color, UA Orange Yellow   Appearance Ur Cloudy (A) Clear   Leukocytes, UA Trace (A) Negative   Protein, UA 1+ (A) Negative/Trace   Glucose, UA Negative Negative   Ketones, UA Negative Negative   RBC, UA Negative Negative   Bilirubin, UA Negative Negative   Urobilinogen, Ur 1.0 0.2 - 1.0 mg/dL   Nitrite, UA Negative Negative   Microscopic Examination See below:   PSA  Result Value Ref Range   Prostate Specific Ag, Serum WILL FOLLOW       Assessment & Plan:   Problem List Items Addressed This Visit      Cardiovascular and  Mediastinum   Benign essential HTN    Stable and WNL, continue current regimen      Relevant Orders   CBC with Differential/Platelet (Completed)   Comprehensive metabolic panel (Completed)   UA/M w/rflx Culture, Routine (Completed)     Musculoskeletal and Integument   Osteoarthritis of left hip - Primary    Pt to discuss joint injection with orthopedics at upcoming f/u. Continue meloxicam and tylenol prn        Other   Hyperlipemia    Check lipids, continue current regimen. Will adjust as needed. Continue good lifestyle modifications      Relevant Orders   Lipid Panel w/o Chol/HDL Ratio (Completed)   Elevated PSA    Recheck PSA. No new urinary sxs      Relevant Orders   PSA (Completed)    Other Visit Diagnoses    Annual physical exam           LABORATORY TESTING:  Health maintenance labs ordered today as discussed above.   The natural history of prostate cancer and ongoing controversy regarding screening and potential treatment outcomes of prostate cancer has been discussed with the patient. The meaning of a false positive PSA and a false negative PSA has been discussed. He indicates understanding of the limitations of this screening test and wishes to proceed with screening PSA testing.   IMMUNIZATIONS:   - Tdap: Tetanus vaccination status reviewed: last tetanus booster within 10 years. - Influenza: Postponed to flu season - Pneumovax: Up to date - Prevnar: Up to date - HPV: Not applicable - Zostavax vaccine: Up to date  SCREENING: - Colonoscopy: Up to date  Discussed with patient purpose of the colonoscopy is to detect colon cancer at curable precancerous or early stages   PATIENT COUNSELING:    Sexuality: Discussed sexually transmitted diseases, partner selection, use of condoms, avoidance of unintended pregnancy  and contraceptive alternatives.   Advised to avoid cigarette smoking.  I discussed with the patient that most people either abstain from  alcohol or drink within safe limits (<=14/week and <=4 drinks/occasion for males, <=7/weeks and <= 3 drinks/occasion for females) and that the risk for alcohol disorders and other health effects rises proportionally with the number of drinks per week and how often a drinker exceeds daily limits.  Discussed cessation/primary prevention of drug use and availability of treatment for abuse.   Diet: Encouraged to adjust caloric intake to maintain  or achieve  ideal body weight, to reduce intake of dietary saturated fat and total fat, to limit sodium intake by avoiding high sodium foods and not adding table salt, and to maintain adequate dietary potassium and calcium preferably from fresh fruits, vegetables, and low-fat dairy products.    stressed the importance of regular exercise  Injury prevention: Discussed safety belts, safety helmets, smoke detector, smoking near bedding or upholstery.   Dental health: Discussed importance of regular tooth brushing, flossing, and dental visits.   Follow up plan: NEXT PREVENTATIVE PHYSICAL DUE IN 1 YEAR. Return in about 6 months (around 02/20/2018) for BP, chol with Jolene.

## 2017-08-20 NOTE — Progress Notes (Deleted)
   Ht 5\' 10"  (1.778 m)   Wt 209 lb 9.6 oz (95.1 kg)   BMI 30.07 kg/m    Subjective:    Patient ID: Matthew Randolph, male    DOB: 23-Jun-1940, 77 y.o.   MRN: [id number]  HPI: Matthew Randolph is a 77 y.o. male  Chief Complaint  Patient presents with  . Annual Exam  . Hip Pain    pt would like to get a cortizon shot for hip pain    Relevant past medical, surgical, family and social history reviewed and updated as indicated. Interim medical history since our last visit reviewed. Allergies and medications reviewed and updated.  Review of Systems  Per HPI unless specifically indicated above     Objective:    Ht 5\' 10"  (1.778 m)   Wt 209 lb 9.6 oz (95.1 kg)   BMI 30.07 kg/m   Wt Readings from Last 3 Encounters:  08/20/17 209 lb 9.6 oz (95.1 kg)  08/05/17 212 lb 12.8 oz (96.5 kg)  02/24/17 215 lb 6.4 oz (97.7 kg)    Physical Exam  Results for orders placed or performed in visit on 03/17/17  Comprehensive metabolic panel  Result Value Ref Range   Glucose 86 65 - 99 mg/dL   BUN 11 8 - 27 mg/dL   Creatinine, Ser 0.99 0.76 - 1.27 mg/dL   GFR calc non Af Amer 75 >59 mL/min/1.73   GFR calc Af Amer 86 >59 mL/min/1.73   BUN/Creatinine Ratio 11 10 - 24   Sodium 143 134 - 144 mmol/L   Potassium 4.9 3.5 - 5.2 mmol/L   Chloride 106 96 - 106 mmol/L   CO2 22 20 - 29 mmol/L   Calcium 9.8 8.6 - 10.2 mg/dL   Total Protein 7.2 6.0 - 8.5 g/dL   Albumin 4.2 3.5 - 4.8 g/dL   Globulin, Total 3.0 1.5 - 4.5 g/dL   Albumin/Globulin Ratio 1.4 1.2 - 2.2   Bilirubin Total 0.6 0.0 - 1.2 mg/dL   Alkaline Phosphatase 57 39 - 117 IU/L   AST 22 0 - 40 IU/L   ALT 22 0 - 44 IU/L      Assessment & Plan:   Problem List Items Addressed This Visit    None       Follow up plan: No follow-ups on file.

## 2017-08-23 ENCOUNTER — Telehealth: Payer: Self-pay | Admitting: Family Medicine

## 2017-08-23 DIAGNOSIS — R972 Elevated prostate specific antigen [PSA]: Secondary | ICD-10-CM

## 2017-08-23 LAB — COMPREHENSIVE METABOLIC PANEL
ALT: 22 IU/L (ref 0–44)
AST: 20 IU/L (ref 0–40)
Albumin/Globulin Ratio: 1.3 (ref 1.2–2.2)
Albumin: 4.4 g/dL (ref 3.5–4.8)
Alkaline Phosphatase: 59 IU/L (ref 39–117)
BUN/Creatinine Ratio: 11 (ref 10–24)
BUN: 13 mg/dL (ref 8–27)
Bilirubin Total: 0.4 mg/dL (ref 0.0–1.2)
CALCIUM: 9.5 mg/dL (ref 8.6–10.2)
CO2: 17 mmol/L — AB (ref 20–29)
CREATININE: 1.15 mg/dL (ref 0.76–1.27)
Chloride: 102 mmol/L (ref 96–106)
GFR calc Af Amer: 72 mL/min/{1.73_m2} (ref >59)
GFR calc non Af Amer: 62 mL/min/{1.73_m2} (ref >59)
GLOBULIN, TOTAL: 3.4 g/dL (ref 1.5–4.5)
Glucose: 82 mg/dL (ref 65–99)
Potassium: 4.6 mmol/L (ref 3.5–5.2)
SODIUM: 144 mmol/L (ref 134–144)
Total Protein: 7.8 g/dL (ref 6.0–8.5)

## 2017-08-23 LAB — CBC WITH DIFFERENTIAL/PLATELET
Basophils Absolute: 0 10*3/uL (ref 0.0–0.2)
Basos: 1 %
EOS (ABSOLUTE): 0.1 10*3/uL (ref 0.0–0.4)
EOS: 1 %
HEMATOCRIT: 40.4 % (ref 37.5–51.0)
HEMOGLOBIN: 12.5 g/dL — AB (ref 13.0–17.7)
Immature Grans (Abs): 0 10*3/uL (ref 0.0–0.1)
Immature Granulocytes: 0 %
LYMPHS ABS: 2.7 10*3/uL (ref 0.7–3.1)
Lymphs: 40 %
MCH: 26.9 pg (ref 26.6–33.0)
MCHC: 30.9 g/dL — ABNORMAL LOW (ref 31.5–35.7)
MCV: 87 fL (ref 79–97)
MONOCYTES: 7 %
Monocytes Absolute: 0.5 10*3/uL (ref 0.1–0.9)
Neutrophils Absolute: 3.4 10*3/uL (ref 1.4–7.0)
Neutrophils: 51 %
Platelets: 328 10*3/uL (ref 150–450)
RBC: 4.65 x10E6/uL (ref 4.14–5.80)
RDW: 14.1 % (ref 12.3–15.4)
WBC: 6.7 10*3/uL (ref 3.4–10.8)

## 2017-08-23 LAB — LIPID PANEL W/O CHOL/HDL RATIO
Cholesterol, Total: 168 mg/dL (ref 100–199)
HDL: 51 mg/dL (ref >39)
LDL CALC: 85 mg/dL (ref 0–99)
Triglycerides: 159 mg/dL — ABNORMAL HIGH (ref 0–149)
VLDL Cholesterol Cal: 32 mg/dL (ref 5–40)

## 2017-08-23 LAB — PSA: PROSTATE SPECIFIC AG, SERUM: 4.2 ng/mL — AB (ref 0.0–4.0)

## 2017-08-23 NOTE — Telephone Encounter (Signed)
Please let him know his PSA is creeping up and is now over normal limits at 4.2. I'm putting in a referral to Urology for him just to get evaluated

## 2017-08-23 NOTE — Patient Instructions (Signed)
Follow up in 6 months 

## 2017-08-23 NOTE — Assessment & Plan Note (Signed)
Stable and WNL, continue current regimen 

## 2017-08-23 NOTE — Assessment & Plan Note (Signed)
Recheck PSA. No new urinary sxs

## 2017-08-23 NOTE — Assessment & Plan Note (Signed)
Check lipids, continue current regimen. Will adjust as needed. Continue good lifestyle modifications

## 2017-08-23 NOTE — Assessment & Plan Note (Signed)
Pt to discuss joint injection with orthopedics at upcoming f/u. Continue meloxicam and tylenol prn

## 2017-08-24 ENCOUNTER — Telehealth: Payer: Self-pay

## 2017-08-24 NOTE — Telephone Encounter (Signed)
Patient notified

## 2017-08-24 NOTE — Telephone Encounter (Signed)
They were released yesterday via mychart - see below  Notes recorded by Volney American, PA-C on 08/23/2017 at 3:45 PM EDT Your labs came back normal, just waiting on the PSA result - will reach out when that is back

## 2017-08-24 NOTE — Telephone Encounter (Signed)
Patient would like the other lab results.

## 2017-08-25 ENCOUNTER — Encounter: Payer: Medicare HMO | Admitting: Unknown Physician Specialty

## 2017-08-27 DIAGNOSIS — M87051 Idiopathic aseptic necrosis of right femur: Secondary | ICD-10-CM | POA: Diagnosis not present

## 2017-09-02 ENCOUNTER — Encounter
Admission: RE | Admit: 2017-09-02 | Discharge: 2017-09-02 | Disposition: A | Discharge status: Home / Self Care | Payer: Medicare HMO | Source: Ambulatory Visit | Attending: Orthopedic Surgery | Admitting: Orthopedic Surgery

## 2017-09-02 ENCOUNTER — Other Ambulatory Visit: Payer: Self-pay

## 2017-09-02 DIAGNOSIS — Z01810 Encounter for preprocedural cardiovascular examination: Secondary | ICD-10-CM | POA: Diagnosis present

## 2017-09-02 DIAGNOSIS — I10 Essential (primary) hypertension: Secondary | ICD-10-CM | POA: Diagnosis not present

## 2017-09-02 DIAGNOSIS — Z01812 Encounter for preprocedural laboratory examination: Secondary | ICD-10-CM | POA: Diagnosis not present

## 2017-09-02 DIAGNOSIS — R9431 Abnormal electrocardiogram [ECG] [EKG]: Secondary | ICD-10-CM | POA: Diagnosis not present

## 2017-09-02 HISTORY — DX: Gastro-esophageal reflux disease without esophagitis: K21.9

## 2017-09-02 LAB — URINALYSIS, COMPLETE (UACMP) WITH MICROSCOPIC
Bacteria, UA: NONE SEEN
Bilirubin Urine: NEGATIVE
GLUCOSE, UA: NEGATIVE mg/dL
Hgb urine dipstick: NEGATIVE
KETONES UR: NEGATIVE mg/dL
Leukocytes, UA: NEGATIVE
Nitrite: NEGATIVE
PH: 5 (ref 5.0–8.0)
Protein, ur: NEGATIVE mg/dL
SPECIFIC GRAVITY, URINE: 1.019 (ref 1.005–1.030)

## 2017-09-02 LAB — CBC
HEMATOCRIT: 39.1 % — AB (ref 40.0–52.0)
HEMOGLOBIN: 12.6 g/dL — AB (ref 13.0–18.0)
MCH: 28.1 pg (ref 26.0–34.0)
MCHC: 32.2 g/dL (ref 32.0–36.0)
MCV: 87.2 fL (ref 80.0–100.0)
Platelets: 290 10*3/uL (ref 150–440)
RBC: 4.48 MIL/uL (ref 4.40–5.90)
RDW: 13.8 % (ref 11.5–14.5)
WBC: 6.4 10*3/uL (ref 3.8–10.6)

## 2017-09-02 LAB — SURGICAL PCR SCREEN
MRSA, PCR: NEGATIVE
STAPHYLOCOCCUS AUREUS: NEGATIVE

## 2017-09-02 LAB — BASIC METABOLIC PANEL
ANION GAP: 11 (ref 5–15)
BUN: 11 mg/dL (ref 8–23)
CHLORIDE: 106 mmol/L (ref 98–111)
CO2: 25 mmol/L (ref 22–32)
CREATININE: 1.03 mg/dL (ref 0.61–1.24)
Calcium: 9.8 mg/dL (ref 8.9–10.3)
GFR calc non Af Amer: >60 mL/min (ref >60)
Glucose, Bld: 94 mg/dL (ref 70–99)
POTASSIUM: 4.1 mmol/L (ref 3.5–5.1)
SODIUM: 142 mmol/L (ref 135–145)

## 2017-09-02 LAB — TYPE AND SCREEN
ABO/RH(D): O POS
Antibody Screen: NEGATIVE

## 2017-09-02 LAB — APTT: APTT: 30 s (ref 24–36)

## 2017-09-02 LAB — PROTIME-INR
INR: 1
PROTHROMBIN TIME: 13.1 s (ref 11.4–15.2)

## 2017-09-02 NOTE — Patient Instructions (Signed)
Your procedure is scheduled on: 09/09/17 Thur Report to Same Day Surgery 2nd floor medical mall [facility] Entrance-take elevator on left to 2nd floor.  Check in with surgery information desk.) To find out your arrival time please call [phone] between 1PM - 3PM on 09/08/17 Wed  Remember: Instructions that are not followed completely may result in serious medical risk, up to and including death, or upon the discretion of your surgeon and anesthesiologist your surgery may need to be rescheduled.    _x___ 1. Do not eat food after midnight the night before your procedure. You may drink clear liquids up to 2 hours before you are scheduled to arrive at the hospital for your procedure.  Do not drink clear liquids within 2 hours of your scheduled arrival to the hospital.  Clear liquids include  --Water or Apple juice without pulp  --Clear carbohydrate beverage such as ClearFast or Gatorade  --Black Coffee or Clear Tea (No milk, no creamers, do not add anything to                  the coffee or Tea Type 1 and type 2 diabetics should only drink water.   ____Ensure clear carbohydrate drink on the way to the hospital for bariatric patients  ____Ensure clear carbohydrate drink 3 hours before surgery for Dr Dwyane Luo patients if physician instructed.   No gum chewing or hard candies.     __x__ 2. No Alcohol for 24 hours before or after surgery.   __x__3. No Smoking or e-cigarettes for 24 prior to surgery.  Do not use any chewable tobacco products for at least 6 hour prior to surgery   ____  4. Bring all medications with you on the day of surgery if instructed.    __x__ 5. Notify your doctor if there is any change in your medical condition     (cold, fever, infections).    x___6. On the morning of surgery brush your teeth with toothpaste and water.  You may rinse your mouth with mouth wash if you wish.  Do not swallow any toothpaste or mouthwash.   Do not wear jewelry, make-up, hairpins,  clips or nail polish.  Do not wear lotions, powders, or perfumes. You may wear deodorant.  Do not shave 48 hours prior to surgery. Men may shave face and neck.  Do not bring valuables to the hospital.    [facility] is not responsible for any belongings or valuables.               Contacts, dentures or bridgework may not be worn into surgery.  Leave your suitcase in the car. After surgery it may be brought to your room.  For patients admitted to the hospital, discharge time is determined by your                       treatment team.  _  Patients discharged the day of surgery will not be allowed to drive home.  You will need someone to drive you home and stay with you the night of your procedure.    Please read over the following fact sheets that you were given:   [facility] Preparing for Surgery and or MRSA Information   _x___ Take anti-hypertensive listed below, cardiac, seizure, asthma,     anti-reflux and psychiatric medicines. These include:  1. pantoprazole (PROTONIX) 40 MG tablet  2.  3.  4.  5.  6.  ____Fleets enema or  Magnesium Citrate as directed.   _x___ Use CHG Soap or sage wipes as directed on instruction sheet   ____ Use inhalers on the day of surgery and bring to hospital day of surgery  ____ Stop Metformin and Janumet 2 days prior to surgery.    ____ Take 1/2 of usual insulin dose the night before surgery and none on the morning     surgery.   _x___ Follow recommendations from Cardiologist, Pulmonologist or PCP regarding          stopping Aspirin, Coumadin, Plavix ,Eliquis, Effient, or Pradaxa, and Pletal.  X____Stop Anti-inflammatories such as Advil, Aleve, Ibuprofen, Motrin, Naproxen, Naprosyn, Goodies powders or aspirin products. OK to take Tylenol and   Celebrex.  Stop meloxicam today.  _x___ Stop supplements until after surgery.  But may continue Vitamin D, Vitamin B,       and multivitamin.   ____ Bring C-Pap to the hospital.

## 2017-09-03 LAB — URINE CULTURE

## 2017-09-07 NOTE — Pre-Procedure Instructions (Signed)
EKG COMPARED WITH 2017 CARE EVERYWHERE

## 2017-09-09 ENCOUNTER — Encounter: Payer: Self-pay | Admitting: *Deleted

## 2017-09-09 ENCOUNTER — Inpatient Hospital Stay: Payer: Medicare HMO

## 2017-09-09 ENCOUNTER — Inpatient Hospital Stay: Payer: Medicare HMO | Admitting: Certified Registered"

## 2017-09-09 ENCOUNTER — Inpatient Hospital Stay
Admission: RE | Admit: 2017-09-09 | Discharge: 2017-09-11 | DRG: 470 | Disposition: A | Discharge status: Home Health | Payer: Medicare HMO | Attending: Orthopedic Surgery | Admitting: Orthopedic Surgery

## 2017-09-09 ENCOUNTER — Other Ambulatory Visit: Payer: Self-pay

## 2017-09-09 ENCOUNTER — Encounter
Admission: RE | Disposition: A | Discharge status: Home Health | Payer: Self-pay | Source: Home / Self Care | Attending: Orthopedic Surgery

## 2017-09-09 DIAGNOSIS — K219 Gastro-esophageal reflux disease without esophagitis: Secondary | ICD-10-CM | POA: Diagnosis present

## 2017-09-09 DIAGNOSIS — Z471 Aftercare following joint replacement surgery: Secondary | ICD-10-CM | POA: Diagnosis not present

## 2017-09-09 DIAGNOSIS — E785 Hyperlipidemia, unspecified: Secondary | ICD-10-CM | POA: Diagnosis present

## 2017-09-09 DIAGNOSIS — N189 Chronic kidney disease, unspecified: Secondary | ICD-10-CM | POA: Diagnosis present

## 2017-09-09 DIAGNOSIS — M1611 Unilateral primary osteoarthritis, right hip: Secondary | ICD-10-CM | POA: Diagnosis present

## 2017-09-09 DIAGNOSIS — I129 Hypertensive chronic kidney disease with stage 1 through stage 4 chronic kidney disease, or unspecified chronic kidney disease: Secondary | ICD-10-CM | POA: Diagnosis present

## 2017-09-09 DIAGNOSIS — Z79899 Other long term (current) drug therapy: Secondary | ICD-10-CM

## 2017-09-09 DIAGNOSIS — M87851 Other osteonecrosis, right femur: Secondary | ICD-10-CM | POA: Diagnosis not present

## 2017-09-09 DIAGNOSIS — Z8249 Family history of ischemic heart disease and other diseases of the circulatory system: Secondary | ICD-10-CM | POA: Diagnosis not present

## 2017-09-09 DIAGNOSIS — Z96641 Presence of right artificial hip joint: Secondary | ICD-10-CM | POA: Diagnosis not present

## 2017-09-09 DIAGNOSIS — Z09 Encounter for follow-up examination after completed treatment for conditions other than malignant neoplasm: Secondary | ICD-10-CM

## 2017-09-09 DIAGNOSIS — I959 Hypotension, unspecified: Secondary | ICD-10-CM | POA: Diagnosis not present

## 2017-09-09 DIAGNOSIS — M879 Osteonecrosis, unspecified: Principal | ICD-10-CM | POA: Diagnosis present

## 2017-09-09 DIAGNOSIS — M8788 Other osteonecrosis, other site: Secondary | ICD-10-CM | POA: Diagnosis not present

## 2017-09-09 DIAGNOSIS — D62 Acute posthemorrhagic anemia: Secondary | ICD-10-CM | POA: Diagnosis not present

## 2017-09-09 DIAGNOSIS — G8918 Other acute postprocedural pain: Secondary | ICD-10-CM

## 2017-09-09 DIAGNOSIS — M25551 Pain in right hip: Secondary | ICD-10-CM | POA: Diagnosis not present

## 2017-09-09 HISTORY — PX: TOTAL HIP ARTHROPLASTY: SHX124

## 2017-09-09 LAB — ABO/RH: ABO/RH(D): O POS

## 2017-09-09 LAB — SEDIMENTATION RATE: Sed Rate: 52 mm/hr — ABNORMAL HIGH (ref 0–20)

## 2017-09-09 SURGERY — ARTHROPLASTY, HIP, TOTAL, ANTERIOR APPROACH
Anesthesia: Spinal | Laterality: Right

## 2017-09-09 MED ORDER — FENTANYL CITRATE (PF) 100 MCG/2ML IJ SOLN
INTRAMUSCULAR | Status: DC | PRN
  Administered 2017-09-09 (×2): 50 ug via INTRAVENOUS

## 2017-09-09 MED ORDER — EPHEDRINE SULFATE 50 MG/ML IJ SOLN
INTRAMUSCULAR | Status: DC | PRN
  Administered 2017-09-09: 10 mg via INTRAVENOUS
  Administered 2017-09-09: 20 mg via INTRAVENOUS

## 2017-09-09 MED ORDER — GLYCOPYRROLATE 0.2 MG/ML IJ SOLN
INTRAMUSCULAR | Status: AC
  Filled 2017-09-09: qty 1

## 2017-09-09 MED ORDER — ZOLPIDEM TARTRATE 5 MG PO TABS: 5.0000 mg | ORAL_TABLET | Freq: Every evening | ORAL | Status: DC | PRN

## 2017-09-09 MED ORDER — MIDAZOLAM HCL 2 MG/2ML IJ SOLN
INTRAMUSCULAR | Status: AC
  Filled 2017-09-09: qty 2

## 2017-09-09 MED ORDER — NEOMYCIN-POLYMYXIN B GU 40-200000 IR SOLN
Status: AC
  Filled 2017-09-09: qty 4

## 2017-09-09 MED ORDER — FAMOTIDINE 20 MG PO TABS
20.0000 mg | ORAL_TABLET | Freq: Once | ORAL | Status: AC
  Administered 2017-09-09: 20 mg via ORAL

## 2017-09-09 MED ORDER — CEFAZOLIN SODIUM-DEXTROSE 2-4 GM/100ML-% IV SOLN
2.0000 g | Freq: Once | INTRAVENOUS | Status: AC
  Administered 2017-09-09: 2 g via INTRAVENOUS

## 2017-09-09 MED ORDER — ALUM & MAG HYDROXIDE-SIMETH 200-200-20 MG/5ML PO SUSP: 30.0000 mL | ORAL | Status: DC | PRN

## 2017-09-09 MED ORDER — PANTOPRAZOLE SODIUM 40 MG PO TBEC
40.0000 mg | DELAYED_RELEASE_TABLET | Freq: Every day | ORAL | Status: DC
  Administered 2017-09-10 – 2017-09-11 (×2): 40 mg via ORAL
  Filled 2017-09-09 (×3): qty 1

## 2017-09-09 MED ORDER — DIPHENHYDRAMINE HCL 12.5 MG/5ML PO ELIX: 12.5000 mg | ORAL_SOLUTION | ORAL | Status: DC | PRN

## 2017-09-09 MED ORDER — ACETAMINOPHEN 500 MG PO TABS
1000.0000 mg | ORAL_TABLET | Freq: Four times a day (QID) | ORAL | Status: AC
  Administered 2017-09-09 – 2017-09-10 (×4): 1000 mg via ORAL
  Filled 2017-09-09 (×4): qty 2

## 2017-09-09 MED ORDER — MIDAZOLAM HCL 5 MG/5ML IJ SOLN
INTRAMUSCULAR | Status: DC | PRN
  Administered 2017-09-09 (×2): 1 mg via INTRAVENOUS

## 2017-09-09 MED ORDER — HYDROMORPHONE HCL 1 MG/ML IJ SOLN: 0.5000 mg | INTRAMUSCULAR | Status: DC | PRN

## 2017-09-09 MED ORDER — ACETAMINOPHEN 325 MG PO TABS: 325.0000 mg | ORAL_TABLET | Freq: Four times a day (QID) | ORAL | Status: DC | PRN

## 2017-09-09 MED ORDER — PROPOFOL 10 MG/ML IV BOLUS
INTRAVENOUS | Status: AC
  Filled 2017-09-09: qty 20

## 2017-09-09 MED ORDER — PROPOFOL 500 MG/50ML IV EMUL
INTRAVENOUS | Status: DC | PRN
  Administered 2017-09-09: 50 ug/kg/min via INTRAVENOUS

## 2017-09-09 MED ORDER — BUPIVACAINE-EPINEPHRINE 0.25% -1:200000 IJ SOLN
INTRAMUSCULAR | Status: DC | PRN
  Administered 2017-09-09: 30 mL

## 2017-09-09 MED ORDER — PHENOL 1.4 % MT LIQD: 1.0000 | OROMUCOSAL | Status: DC | PRN

## 2017-09-09 MED ORDER — MENTHOL 3 MG MT LOZG: 1.0000 | LOZENGE | OROMUCOSAL | Status: DC | PRN

## 2017-09-09 MED ORDER — OXYCODONE HCL 5 MG PO TABS
5.0000 mg | ORAL_TABLET | ORAL | Status: DC | PRN
  Administered 2017-09-09 – 2017-09-10 (×3): 5 mg via ORAL
  Administered 2017-09-10: 10 mg via ORAL
  Administered 2017-09-10: 5 mg via ORAL
  Filled 2017-09-09 (×3): qty 1
  Filled 2017-09-09: qty 2
  Filled 2017-09-09: qty 1

## 2017-09-09 MED ORDER — EPHEDRINE SULFATE 50 MG/ML IJ SOLN
INTRAMUSCULAR | Status: AC
  Filled 2017-09-09: qty 1

## 2017-09-09 MED ORDER — BUPIVACAINE HCL (PF) 0.5 % IJ SOLN
INTRAMUSCULAR | Status: AC
  Filled 2017-09-09: qty 10

## 2017-09-09 MED ORDER — ONDANSETRON HCL 4 MG/2ML IJ SOLN: 4.0000 mg | Freq: Once | INTRAMUSCULAR | Status: DC | PRN

## 2017-09-09 MED ORDER — CEFAZOLIN SODIUM-DEXTROSE 2-4 GM/100ML-% IV SOLN
INTRAVENOUS | Status: AC
  Filled 2017-09-09: qty 100

## 2017-09-09 MED ORDER — TRANEXAMIC ACID 1000 MG/10ML IV SOLN
1000.0000 mg | INTRAVENOUS | Status: AC
  Administered 2017-09-09: 1000 mg via INTRAVENOUS
  Filled 2017-09-09: qty 1000

## 2017-09-09 MED ORDER — ATORVASTATIN CALCIUM 10 MG PO TABS
10.0000 mg | ORAL_TABLET | Freq: Every day | ORAL | Status: DC
  Administered 2017-09-09 – 2017-09-10 (×2): 10 mg via ORAL
  Filled 2017-09-09 (×2): qty 1

## 2017-09-09 MED ORDER — FENTANYL CITRATE (PF) 100 MCG/2ML IJ SOLN
INTRAMUSCULAR | Status: AC
  Filled 2017-09-09: qty 2

## 2017-09-09 MED ORDER — OXYCODONE HCL 5 MG PO TABS: 10.0000 mg | ORAL_TABLET | ORAL | Status: DC | PRN

## 2017-09-09 MED ORDER — DOCUSATE SODIUM 100 MG PO CAPS
100.0000 mg | ORAL_CAPSULE | Freq: Two times a day (BID) | ORAL | Status: DC
  Administered 2017-09-09 – 2017-09-11 (×4): 100 mg via ORAL
  Filled 2017-09-09 (×5): qty 1

## 2017-09-09 MED ORDER — ONDANSETRON HCL 4 MG PO TABS: 4.0000 mg | ORAL_TABLET | Freq: Four times a day (QID) | ORAL | Status: DC | PRN

## 2017-09-09 MED ORDER — MAGNESIUM CITRATE PO SOLN: 1.0000 | Freq: Once | ORAL | Status: DC | PRN

## 2017-09-09 MED ORDER — PROPOFOL 10 MG/ML IV BOLUS
INTRAVENOUS | Status: DC | PRN
  Administered 2017-09-09 (×2): 10 mg via INTRAVENOUS

## 2017-09-09 MED ORDER — LISINOPRIL 5 MG PO TABS
5.0000 mg | ORAL_TABLET | Freq: Every day | ORAL | Status: DC
  Administered 2017-09-09 – 2017-09-11 (×3): 5 mg via ORAL
  Filled 2017-09-09 (×3): qty 1

## 2017-09-09 MED ORDER — GABAPENTIN 300 MG PO CAPS
300.0000 mg | ORAL_CAPSULE | Freq: Three times a day (TID) | ORAL | Status: DC
  Administered 2017-09-09 – 2017-09-11 (×6): 300 mg via ORAL
  Filled 2017-09-09 (×6): qty 1

## 2017-09-09 MED ORDER — SODIUM CHLORIDE 0.9 % IV SOLN
INTRAVENOUS | Status: DC
  Administered 2017-09-09 (×2): via INTRAVENOUS

## 2017-09-09 MED ORDER — ATROPINE SULFATE 0.4 MG/ML IJ SOLN
INTRAMUSCULAR | Status: AC
  Filled 2017-09-09: qty 1

## 2017-09-09 MED ORDER — CEFAZOLIN SODIUM-DEXTROSE 2-4 GM/100ML-% IV SOLN
2.0000 g | Freq: Four times a day (QID) | INTRAVENOUS | Status: AC
  Administered 2017-09-09 – 2017-09-10 (×3): 2 g via INTRAVENOUS
  Filled 2017-09-09 (×3): qty 100

## 2017-09-09 MED ORDER — SENNOSIDES-DOCUSATE SODIUM 8.6-50 MG PO TABS
1.0000 | ORAL_TABLET | Freq: Every evening | ORAL | Status: DC | PRN
  Administered 2017-09-11: 1 via ORAL
  Filled 2017-09-09: qty 1

## 2017-09-09 MED ORDER — BUPIVACAINE-EPINEPHRINE (PF) 0.25% -1:200000 IJ SOLN
INTRAMUSCULAR | Status: AC
  Filled 2017-09-09: qty 30

## 2017-09-09 MED ORDER — ASPIRIN EC 325 MG PO TBEC
325.0000 mg | DELAYED_RELEASE_TABLET | Freq: Every day | ORAL | Status: DC
  Administered 2017-09-10 – 2017-09-11 (×2): 325 mg via ORAL
  Filled 2017-09-09 (×2): qty 1

## 2017-09-09 MED ORDER — PHENYLEPHRINE HCL 10 MG/ML IJ SOLN
INTRAMUSCULAR | Status: DC | PRN
  Administered 2017-09-09: 200 ug via INTRAVENOUS
  Administered 2017-09-09 (×5): 100 ug via INTRAVENOUS
  Administered 2017-09-09: 200 ug via INTRAVENOUS

## 2017-09-09 MED ORDER — METHOCARBAMOL 1000 MG/10ML IJ SOLN
500.0000 mg | Freq: Four times a day (QID) | INTRAVENOUS | Status: DC | PRN
  Filled 2017-09-09: qty 5

## 2017-09-09 MED ORDER — LACTATED RINGERS IV SOLN
INTRAVENOUS | Status: DC
  Administered 2017-09-09: 09:00:00 via INTRAVENOUS

## 2017-09-09 MED ORDER — NEOMYCIN-POLYMYXIN B GU 40-200000 IR SOLN
Status: DC | PRN
  Administered 2017-09-09: 4 mL

## 2017-09-09 MED ORDER — GLYCOPYRROLATE 0.2 MG/ML IJ SOLN
INTRAMUSCULAR | Status: DC | PRN
  Administered 2017-09-09: 0.2 mg via INTRAVENOUS

## 2017-09-09 MED ORDER — ATROPINE SULFATE 0.4 MG/ML IJ SOLN
INTRAMUSCULAR | Status: DC | PRN
  Administered 2017-09-09: 0.2 mg via INTRAVENOUS

## 2017-09-09 MED ORDER — METOCLOPRAMIDE HCL 5 MG/ML IJ SOLN: 5.0000 mg | Freq: Three times a day (TID) | INTRAMUSCULAR | Status: DC | PRN

## 2017-09-09 MED ORDER — FAMOTIDINE 20 MG PO TABS
ORAL_TABLET | ORAL | Status: AC
  Administered 2017-09-09: 20 mg via ORAL
  Filled 2017-09-09: qty 1

## 2017-09-09 MED ORDER — FENTANYL CITRATE (PF) 100 MCG/2ML IJ SOLN: 25.0000 ug | INTRAMUSCULAR | Status: DC | PRN

## 2017-09-09 MED ORDER — BISACODYL 5 MG PO TBEC: 5.0000 mg | DELAYED_RELEASE_TABLET | Freq: Every day | ORAL | Status: DC | PRN

## 2017-09-09 MED ORDER — TRAMADOL HCL 50 MG PO TABS
50.0000 mg | ORAL_TABLET | Freq: Four times a day (QID) | ORAL | Status: DC
  Administered 2017-09-09 – 2017-09-11 (×6): 50 mg via ORAL
  Filled 2017-09-09 (×6): qty 1

## 2017-09-09 MED ORDER — METOCLOPRAMIDE HCL 10 MG PO TABS: 5.0000 mg | ORAL_TABLET | Freq: Three times a day (TID) | ORAL | Status: DC | PRN

## 2017-09-09 MED ORDER — BUPIVACAINE HCL (PF) 0.5 % IJ SOLN
INTRAMUSCULAR | Status: DC | PRN
  Administered 2017-09-09: 3 mL via INTRATHECAL

## 2017-09-09 MED ORDER — ONDANSETRON HCL 4 MG/2ML IJ SOLN: 4.0000 mg | Freq: Four times a day (QID) | INTRAMUSCULAR | Status: DC | PRN

## 2017-09-09 MED ORDER — METHOCARBAMOL 500 MG PO TABS
500.0000 mg | ORAL_TABLET | Freq: Four times a day (QID) | ORAL | Status: DC | PRN
  Administered 2017-09-09: 500 mg via ORAL
  Filled 2017-09-09: qty 1

## 2017-09-09 SURGICAL SUPPLY — 57 items
BLADE SAGITTAL AGGR TOOTH XLG (BLADE) ×2 IMPLANT
BNDG COHESIVE 6X5 TAN STRL LF (GAUZE/BANDAGES/DRESSINGS) ×6 IMPLANT
CANISTER SUCT 1200ML W/VALVE (MISCELLANEOUS) ×2 IMPLANT
CHLORAPREP W/TINT 26ML (MISCELLANEOUS) ×2 IMPLANT
CUP ACETAB VERSA DBL 28X58 DMI (Orthopedic Implant) ×2 IMPLANT
DRAPE C-ARM XRAY 36X54 (DRAPES) ×2 IMPLANT
DRAPE INCISE IOBAN 66X60 STRL (DRAPES) IMPLANT
DRAPE POUCH INSTRU U-SHP 10X18 (DRAPES) ×2 IMPLANT
DRAPE SHEET LG 3/4 BI-LAMINATE (DRAPES) ×6 IMPLANT
DRAPE TABLE BACK 80X90 (DRAPES) ×2 IMPLANT
DRESSING SURGICEL FIBRLLR 1X2 (HEMOSTASIS) ×2 IMPLANT
DRSG OPSITE POSTOP 4X8 (GAUZE/BANDAGES/DRESSINGS) ×4 IMPLANT
DRSG SURGICEL FIBRILLAR 1X2 (HEMOSTASIS) ×4
ELECT BLADE 6.5 EXT (BLADE) ×2 IMPLANT
ELECT REM PT RETURN 9FT ADLT (ELECTROSURGICAL) ×2
ELECTRODE REM PT RTRN 9FT ADLT (ELECTROSURGICAL) ×1 IMPLANT
GLOVE BIOGEL PI IND STRL 9 (GLOVE) ×1 IMPLANT
GLOVE BIOGEL PI INDICATOR 9 (GLOVE) ×1
GLOVE SURG SYN 9.0  PF PI (GLOVE) ×2
GLOVE SURG SYN 9.0 PF PI (GLOVE) ×2 IMPLANT
GOWN SRG 2XL LVL 4 RGLN SLV (GOWNS) ×1 IMPLANT
GOWN STRL NON-REIN 2XL LVL4 (GOWNS) ×1
GOWN STRL REUS W/ TWL LRG LVL3 (GOWN DISPOSABLE) ×1 IMPLANT
GOWN STRL REUS W/TWL LRG LVL3 (GOWN DISPOSABLE) ×1
HEMOVAC 400CC 10FR (MISCELLANEOUS) IMPLANT
HIP FEM HD M 28 (Head) ×2 IMPLANT
HOLDER FOLEY CATH W/STRAP (MISCELLANEOUS) ×2 IMPLANT
HOOD PEEL AWAY FLYTE STAYCOOL (MISCELLANEOUS) ×2 IMPLANT
KIT PREVENA INCISION MGT 13 (CANNISTER) ×2 IMPLANT
MAT ABSORB  FLUID 56X50 GRAY (MISCELLANEOUS) ×1
MAT ABSORB FLUID 56X50 GRAY (MISCELLANEOUS) ×1 IMPLANT
NDL SAFETY ECLIPSE 18X1.5 (NEEDLE) ×1 IMPLANT
NEEDLE HYPO 18GX1.5 SHARP (NEEDLE) ×1
NEEDLE SPNL 18GX3.5 QUINCKE PK (NEEDLE) ×2 IMPLANT
NS IRRIG 1000ML POUR BTL (IV SOLUTION) ×2 IMPLANT
PACK HIP COMPR (MISCELLANEOUS) ×2 IMPLANT
SCALPEL PROTECTED #10 DISP (BLADE) ×4 IMPLANT
SHELL ACETABULAR SZ0 58MM (Shell) ×2 IMPLANT
SOL PREP PVP 2OZ (MISCELLANEOUS) ×2
SOLUTION PREP PVP 2OZ (MISCELLANEOUS) ×1 IMPLANT
SPONGE DRAIN TRACH 4X4 STRL 2S (GAUZE/BANDAGES/DRESSINGS) ×2 IMPLANT
STAPLER SKIN PROX 35W (STAPLE) ×2 IMPLANT
STEM FEMORAL SZ5 STD COLLARED (Stem) ×2 IMPLANT
STRAP SAFETY 5IN WIDE (MISCELLANEOUS) ×2 IMPLANT
SUT DVC 2 QUILL PDO  T11 36X36 (SUTURE) ×1
SUT DVC 2 QUILL PDO T11 36X36 (SUTURE) ×1 IMPLANT
SUT SILK 0 (SUTURE) ×1
SUT SILK 0 30XBRD TIE 6 (SUTURE) ×1 IMPLANT
SUT V-LOC 90 ABS DVC 3-0 CL (SUTURE) ×2 IMPLANT
SUT VIC AB 1 CT1 36 (SUTURE) ×2 IMPLANT
SYR 20CC LL (SYRINGE) ×2 IMPLANT
SYR 30ML LL (SYRINGE) ×2 IMPLANT
SYR BULB IRRIG 60ML STRL (SYRINGE) ×2 IMPLANT
TAPE MICROFOAM 4IN (TAPE) ×2 IMPLANT
TOWEL OR 17X26 4PK STRL BLUE (TOWEL DISPOSABLE) ×2 IMPLANT
TRAY FOLEY MTR SLVR 16FR STAT (SET/KITS/TRAYS/PACK) ×2 IMPLANT
WND VAC CANISTER 500ML (MISCELLANEOUS) ×2 IMPLANT

## 2017-09-09 NOTE — NC FL2 (Signed)
[place] LEVEL OF CARE SCREENING TOOL     IDENTIFICATION  Patient Name: Matthew Randolph Birthdate: November 14, 1940 Sex: male Admission Date (Current Location): 09/09/2017  [place] and [place] Number:  Engineering geologist and Address:  [facility], [address] [place], [place] [address]      Provider Number: [id number]  Attending Physician Name and Address:  Hessie Knows, MD  Relative Name and Phone Number:       Current Level of Care: Hospital Recommended Level of Care: [place] Prior Approval Number:    Date Approved/Denied:   PASRR Number: [id number])  Discharge Plan: SNF    Current Diagnoses: Patient Active Problem List   Diagnosis Date Noted  . Status post total hip replacement, right 09/09/2017  . Elevated PSA 08/19/2016  . Advance care planning 08/19/2016  . Anemia 08/19/2016  . GERD (gastroesophageal reflux disease) 08/19/2016  . Osteoarthritis of left hip 02/17/2016  . Primary osteoarthritis of left knee 02/14/2016  . Hip pain 01/23/2015  . Strain of psoas muscle 09/26/2014  . Tobacco abuse 04/19/2014  . Benign essential HTN 04/19/2014  . ED (erectile dysfunction) 04/19/2014  . CKD (chronic kidney disease) 04/19/2014  . High risk sexual behavior 04/19/2014  . Hyperlipemia 04/19/2014    Orientation RESPIRATION BLADDER Height & Weight     Self, Time, Situation, Place  Normal Continent Weight: 212 lb 1.3 oz (96.2 kg) Height:  6' (182.9 cm)  BEHAVIORAL SYMPTOMS/MOOD NEUROLOGICAL BOWEL NUTRITION STATUS      Continent Diet(Diet: Regular )  AMBULATORY STATUS COMMUNICATION OF NEEDS Skin   Extensive Assist Verbally Surgical wounds, Wound Vac(Incision: Right hip, provena wound vac. )                       Personal Care Assistance Level of Assistance  Bathing, Feeding, Dressing Bathing Assistance: Limited assistance Feeding assistance: Independent Dressing Assistance: Limited assistance      Functional Limitations Info  Sight, Hearing, Speech Sight Info: Adequate Hearing Info: Adequate Speech Info: Adequate    SPECIAL CARE FACTORS FREQUENCY  PT (By licensed PT), OT (By licensed OT)     PT Frequency: (5) OT Frequency: (5)            Contractures      Additional Factors Info  Code Status, Allergies Code Status Info: (Full Code. ) Allergies Info: (No Known Allergies. )           Current Medications (09/09/2017):  This is the current hospital active medication list Current Facility-Administered Medications  Medication Dose Route Frequency Provider Last Rate Last Dose  . 0.9 %  sodium chloride infusion   Intravenous Continuous Hessie Knows, MD 100 mL/hr at 09/09/17 1248    . acetaminophen (TYLENOL) tablet 1,000 mg  1,000 mg Oral Q6H Hessie Knows, MD      . Derrill Memo ON 09/10/2017] acetaminophen (TYLENOL) tablet 325-650 mg  325-650 mg Oral Q6H PRN Hessie Knows, MD      . alum & mag hydroxide-simeth (MAALOX/MYLANTA) 200-200-20 MG/5ML suspension 30 mL  30 mL Oral Q4H PRN Hessie Knows, MD      . Derrill Memo ON 09/10/2017] aspirin EC tablet 325 mg  325 mg Oral Q breakfast Hessie Knows, MD      . atorvastatin (LIPITOR) tablet 10 mg  10 mg Oral q1800 Hessie Knows, MD      . bisacodyl (DULCOLAX) EC tablet 5 mg  5 mg Oral Daily PRN Hessie Knows, MD      .  ceFAZolin (ANCEF) IVPB 2g/100 mL premix  2 g Intravenous Q6H Hessie Knows, MD 200 mL/hr at 09/09/17 1440 2 g at 09/09/17 1440  . diphenhydrAMINE (BENADRYL) 12.5 MG/5ML elixir 12.5-25 mg  12.5-25 mg Oral Q4H PRN Hessie Knows, MD      . docusate sodium (COLACE) capsule 100 mg  100 mg Oral BID Hessie Knows, MD      . gabapentin (NEURONTIN) capsule 300 mg  300 mg Oral TID Hessie Knows, MD   300 mg at 09/09/17 1534  . HYDROmorphone (DILAUDID) injection 0.5-1 mg  0.5-1 mg Intravenous Q4H PRN Hessie Knows, MD      . lisinopril (PRINIVIL,ZESTRIL) tablet 5 mg  5 mg Oral Daily Hessie Knows, MD   5 mg at 09/09/17 1534  . magnesium  citrate solution 1 Bottle  1 Bottle Oral Once PRN Hessie Knows, MD      . menthol-cetylpyridinium (CEPACOL) lozenge 3 mg  1 lozenge Oral PRN Hessie Knows, MD       Or  . phenol (CHLORASEPTIC) mouth spray 1 spray  1 spray Mouth/Throat PRN Hessie Knows, MD      . methocarbamol (ROBAXIN) tablet 500 mg  500 mg Oral Q6H PRN Hessie Knows, MD       Or  . methocarbamol (ROBAXIN) 500 mg in dextrose 5 % 50 mL IVPB  500 mg Intravenous Q6H PRN Hessie Knows, MD      . metoCLOPramide (REGLAN) tablet 5-10 mg  5-10 mg Oral Q8H PRN Hessie Knows, MD       Or  . metoCLOPramide (REGLAN) injection 5-10 mg  5-10 mg Intravenous Q8H PRN Hessie Knows, MD      . ondansetron [facility]) tablet 4 mg  4 mg Oral Q6H PRN Hessie Knows, MD       Or  . ondansetron [facility]) injection 4 mg  4 mg Intravenous Q6H PRN Hessie Knows, MD      . oxyCODONE (Oxy IR/ROXICODONE) immediate release tablet 10-15 mg  10-15 mg Oral Q4H PRN Hessie Knows, MD      . oxyCODONE (Oxy IR/ROXICODONE) immediate release tablet 5-10 mg  5-10 mg Oral Q4H PRN Hessie Knows, MD   5 mg at 09/09/17 1435  . pantoprazole (PROTONIX) EC tablet 40 mg  40 mg Oral Daily Hessie Knows, MD      . senna-docusate (Senokot-S) tablet 1 tablet  1 tablet Oral QHS PRN Hessie Knows, MD      . traMADol [facility]) tablet 50 mg  50 mg Oral Q6H Hessie Knows, MD      . zolpidem (AMBIEN) tablet 5 mg  5 mg Oral QHS PRN Hessie Knows, MD         Discharge Medications: Please see discharge summary for a list of discharge medications.  Relevant Imaging Results:  Relevant Lab Results:   Additional Information (SSN: [id number])  Shatima Zalar, Veronia Beets, LCSW

## 2017-09-09 NOTE — Transfer of Care (Signed)
Immediate Anesthesia Transfer of Care Note  Patient: Matthew Randolph  Procedure(s) Performed: TOTAL HIP ARTHROPLASTY ANTERIOR APPROACH (Right )  Patient Location: PACU  Anesthesia Type:Spinal  Level of Consciousness: awake, alert  and oriented  Airway & Oxygen Therapy: Patient Spontanous Breathing  Post-op Assessment: Report given to RN and Post -op Vital signs reviewed and stable  Post vital signs: Reviewed  Last Vitals:  Vitals Value Taken Time  BP 102/57 09/09/2017 11:02 AM  Temp    Pulse 81 09/09/2017 11:02 AM  Resp 14 09/09/2017 11:02 AM  SpO2 99 % 09/09/2017 11:02 AM  Vitals shown include unvalidated device data.  Last Pain:  Vitals:   09/09/17 0818  TempSrc: Temporal  PainSc: 9          Complications: No apparent anesthesia complications

## 2017-09-09 NOTE — Anesthesia Preprocedure Evaluation (Signed)
Anesthesia Evaluation  Patient identified by MRN, date of birth, ID band Patient awake    Reviewed: Allergy & Precautions, H&P , NPO status , Patient's Chart, lab work & pertinent test results, reviewed documented beta blocker date and time   Airway Mallampati: III   Neck ROM: full    Dental  (+) Poor Dentition, Teeth Intact   Pulmonary neg pulmonary ROS, Current Smoker,    Pulmonary exam normal        Cardiovascular Exercise Tolerance: Good hypertension, On Medications negative cardio ROS Normal cardiovascular exam Rhythm:regular Rate:Normal     Neuro/Psych  Neuromuscular disease negative neurological ROS  negative psych ROS   GI/Hepatic negative GI ROS, Neg liver ROS, GERD  Medicated,  Endo/Other  negative endocrine ROS  Renal/GU Renal diseasenegative Renal ROS  negative genitourinary   Musculoskeletal   Abdominal   Peds  Hematology negative hematology ROS (+) anemia ,   Anesthesia Other Findings Past Medical History: No date: Chronic kidney disease No date: ED (erectile dysfunction) No date: Erectile dysfunction No date: GERD (gastroesophageal reflux disease) No date: High risk sexual behavior No date: Hyperlipidemia No date: Hypertension No date: Tobacco use disorder, continuous Past Surgical History: 12/10/2015: PITUITARY SURGERY BMI    Body Mass Index:  28.76 kg/m     Reproductive/Obstetrics negative OB ROS                             Anesthesia Physical Anesthesia Plan  ASA: III  Anesthesia Plan: General and Spinal   Post-op Pain Management:    Induction:   PONV Risk Score and Plan:   Airway Management Planned:   Additional Equipment:   Intra-op Plan:   Post-operative Plan:   Informed Consent: I have reviewed the patients History and Physical, chart, labs and discussed the procedure including the risks, benefits and alternatives for the proposed anesthesia  with the patient or authorized representative who has indicated his/her understanding and acceptance.   Dental Advisory Given  Plan Discussed with: CRNA  Anesthesia Plan Comments:         Anesthesia Quick Evaluation

## 2017-09-09 NOTE — Anesthesia Procedure Notes (Addendum)
Spinal  Patient location during procedure: OR Start time: 09/09/2017 9:25 AM Staffing Anesthesiologist: Molli Barrows, MD Resident/CRNA: Rolla Plate, CRNA Other anesthesia staff: Dorita Sciara, RN Performed: other anesthesia staff  Preanesthetic Checklist Completed: patient identified, site marked, surgical consent, pre-op evaluation, timeout performed, IV checked, risks and benefits discussed and monitors and equipment checked Spinal Block Patient position: sitting Prep: Betadine Patient monitoring: heart rate, continuous pulse ox, blood pressure and cardiac monitor Approach: midline Location: L4-5 Injection technique: single-shot Needle Needle type: Introducer and Pencan  Needle gauge: 24 G Needle length: 5 cm Assessment Sensory level: T6 Additional Notes Negative paresthesia. Negative blood return. Positive free-flowing CSF. Expiration date of kit checked and confirmed. Patient tolerated procedure well, without complications.

## 2017-09-09 NOTE — Evaluation (Signed)
Physical Therapy Evaluation Patient Details Name: Matthew Randolph MRN: [id number] DOB: [birth year] Today's Date: 09/09/2017   History of Present Illness  77 y/o male s/p R total hip replacement (AVN, anterior approach) 9/5.    Clinical Impression  Pt initially a little hesitant secondary to pain, but ultimately did well with POD0 PT session.  He was able to tolerate ~10 minutes of supine exercises apart from the exam and was able to walk ~30 ft with FWW.  He was able to get up to sitting and then to standing w/o direct assist and generally did quite well.  Pt eager to work hard and get home, he should be safe to go home with HHPT if he continues on his trajectory.    Follow Up Recommendations Home health PT    Equipment Recommendations  Rolling walker with 5" wheels    Recommendations for Other Services       Precautions / Restrictions Precautions Precautions: Anterior Hip Restrictions Weight Bearing Restrictions: Yes RLE Weight Bearing: Weight bearing as tolerated      Mobility  Bed Mobility Overal bed mobility: Modified Independent             General bed mobility comments: Pt needed some extra time and effort, but did not need phyiscal assist  Transfers Overall transfer level: Modified independent Equipment used: Rolling walker (2 wheeled)             General transfer comment: Pt was able to rise from low setting of bed w/o physical assist, heavy UE use  Ambulation/Gait Ambulation/Gait assistance: Min guard Gait Distance (Feet): 30 Feet Assistive device: Rolling walker (2 wheeled)       General Gait Details: Pt did well with ambulation, he was reliant on the walker and somewhat hesitant but ultimately did well and showed good safety  Stairs            Wheelchair Mobility    Modified Rankin (Stroke Patients Only)       Balance Overall balance assessment: Modified Independent                                            Pertinent Vitals/Pain Pain Assessment: 0-10 Pain Score: 7  Pain Location: Pt in pain t/o the session, but not pain limited functionally    Home Living Family/patient expects to be discharged to:: Private residence Living Arrangements: Spouse/significant other Available Help at Discharge: Family;Available 24 hours/day   Home Access: Stairs to enter Entrance Stairs-Rails: None Entrance Stairs-Number of Steps: 2   Home Equipment: None      Prior Function Level of Independence: Independent         Comments: Pt has been limping secondary to AVN R hip pain, but generally is able to do all he needs     Hand Dominance        Extremity/Trunk Assessment   Upper Extremity Assessment Upper Extremity Assessment: Overall WFL for tasks assessed    Lower Extremity Assessment Lower Extremity Assessment: (expected R LE post-op weakness, but WFL t/o )       Communication   Communication: No difficulties  Cognition Arousal/Alertness: Awake/alert Behavior During Therapy: WFL for tasks assessed/performed Overall Cognitive Status: Within Functional Limits for tasks assessed  General Comments      Exercises Total Joint Exercises Ankle Circles/Pumps: AROM;10 reps Quad Sets: Strengthening;10 reps Gluteal Sets: Strengthening;10 reps Short Arc Quad: AROM;Strengthening;10 reps Heel Slides: Strengthening;10 reps Hip ABduction/ADduction: Strengthening;10 reps   Assessment/Plan    PT Assessment Patient needs continued PT services  PT Problem List Decreased strength;Decreased range of motion;Decreased activity tolerance;Decreased balance;Decreased mobility;Decreased safety awareness;Decreased knowledge of use of DME;Decreased knowledge of precautions;Pain       PT Treatment Interventions DME instruction;Stair training;Gait training;Functional mobility training;Therapeutic exercise;Therapeutic activities;Balance  training;Neuromuscular re-education;Patient/family education    PT Goals (Current goals can be found in the Care Plan section)  Acute Rehab PT Goals Patient Stated Goal: go home PT Goal Formulation: With patient Time For Goal Achievement: 09/23/17 Potential to Achieve Goals: Good    Frequency BID   Barriers to discharge        Co-evaluation               AM-PAC PT "6 Clicks" Daily Activity  Outcome Measure Difficulty turning over in bed (including adjusting bedclothes, sheets and blankets)?: None Difficulty moving from lying on back to sitting on the side of the bed? : A Little Difficulty sitting down on and standing up from a chair with arms (e.g., wheelchair, bedside commode, etc,.)?: A Little Help needed moving to and from a bed to chair (including a wheelchair)?: None Help needed walking in hospital room?: A Little Help needed climbing 3-5 steps with a railing? : A Little 6 Click Score: 20    End of Session Equipment Utilized During Treatment: Gait belt Activity Tolerance: Patient tolerated treatment well Patient left: with chair alarm set;with call bell/phone within reach;with family/visitor present   PT Visit Diagnosis: Muscle weakness (generalized) (M62.81);Difficulty in walking, not elsewhere classified (R26.2)    Time: 6720-9470 PT Time Calculation (min) (ACUTE ONLY): 28 min   Charges:   PT Evaluation $PT Eval Low Complexity: 1 Low PT Treatments $Therapeutic Exercise: 8-22 mins        Kreg Shropshire, DPT 09/09/2017, 5:37 PM

## 2017-09-09 NOTE — Op Note (Signed)
09/09/2017  11:08 AM  PATIENT:  Matthew Randolph  77 y.o. male  PRE-OPERATIVE DIAGNOSIS:  AVASCULAR NECROSIS OF BONE RIGHT HIP  POST-OPERATIVE DIAGNOSIS:  AVASCULAR NECROSIS OF BONE RIGHT HIP  PROCEDURE:  Procedure(s): TOTAL HIP ARTHROPLASTY ANTERIOR APPROACH (Right)  SURGEON: Laurene Footman, MD  ASSISTANTS: None  ANESTHESIA:   spinal  EBL:  Total I/O In: 700 [I.V.:700] Out: 350 [Urine:50; Blood:300]  BLOOD ADMINISTERED:none  DRAINS: none   LOCAL MEDICATIONS USED:  MARCAINE     SPECIMEN:  Source of Specimen:  Right femoral head  DISPOSITION OF SPECIMEN:  PATHOLOGY  COUNTS:  YES  TOURNIQUET:  * No tourniquets in log *  IMPLANTS: Medacta AMIS 5 standard stem with 58 mm Mpact TM cup and liner with millimeters metal 28 mm head  DICTATION: .Dragon Dictation   The patient was brought to the operating room and after spinal anesthesia was obtained patient was placed on the operative table with the ipsilateral foot into the Medacta attachment, contralateral leg on a well-padded table. C-arm was brought in and preop template x-ray taken. After prepping and draping in usual sterile fashion appropriate patient identification and timeout procedures were completed. Anterior approach to the hip was obtained and centered over the greater trochanter and TFL muscle. The subcutaneous tissue was incised hemostasis being achieved by electrocautery. TFL fascia was incised and the muscle retracted laterally deep retractor placed. The lateral femoral circumflex vessels were identified and ligated. The anterior capsule was exposed and a capsulotomy performed. The neck was identified and a femoral neck cut carried out with a saw. The head was removed without difficulty and showed sclerotic femoral head and acetabulum. Reaming was carried out to 56 mm and a 58 mm cup trial gave appropriate tightness to the acetabular component a 58 DM cup was impacted into position. The leg was then externally rotated and  ischiofemoral and pubofemoral releases carried out. The femur was sequentially broached to a size 5, size 5 standard with S head trials were placed and the final components chosen. The 5 standard stem was inserted along with a millimeter metal 28 mm head and 58 mm liner. The hip was reduced and was stable the wound was thoroughly irrigated with fibrillar placed along the posterior capsule and medial neck. The deep fascia ws closed using a heavy Quill after infiltration of 30 cc of quarter percent Sensorcaine with epinephrine.3-0 V-loc to close the skin with skin staples.  Incisional wound VAC applied  PLAN OF CARE: Admit to inpatient

## 2017-09-09 NOTE — Anesthesia Post-op Follow-up Note (Signed)
Anesthesia QCDR form completed.        

## 2017-09-09 NOTE — Progress Notes (Signed)
Pt. admitted to unit, room 141  From PACU. Oriented to room, call bell, Ascom phones and staff. Bed in low position. Fall safety plan reviewed,non-skid socks in place, bed alarm on. Pt A&O X4. Full assessment to Epic; skin assessed with Nonnie Done, Therapist, sports.  Will continue to monitor.

## 2017-09-09 NOTE — H&P (Signed)
Reviewed paper H+P, will be scanned into chart. No changes noted.  

## 2017-09-10 LAB — CBC
HEMATOCRIT: 30.4 % — AB (ref 40.0–52.0)
Hemoglobin: 10.1 g/dL — ABNORMAL LOW (ref 13.0–18.0)
MCH: 28.7 pg (ref 26.0–34.0)
MCHC: 33.3 g/dL (ref 32.0–36.0)
MCV: 86.1 fL (ref 80.0–100.0)
PLATELETS: 235 10*3/uL (ref 150–440)
RBC: 3.52 MIL/uL — ABNORMAL LOW (ref 4.40–5.90)
RDW: 13.8 % (ref 11.5–14.5)
WBC: 7.1 10*3/uL (ref 3.8–10.6)

## 2017-09-10 LAB — BASIC METABOLIC PANEL
ANION GAP: 5 (ref 5–15)
BUN: 16 mg/dL (ref 8–23)
CO2: 25 mmol/L (ref 22–32)
CREATININE: 1.13 mg/dL (ref 0.61–1.24)
Calcium: 8.4 mg/dL — ABNORMAL LOW (ref 8.9–10.3)
Chloride: 106 mmol/L (ref 98–111)
GFR calc non Af Amer: >60 mL/min (ref >60)
Glucose, Bld: 132 mg/dL — ABNORMAL HIGH (ref 70–99)
Potassium: 3.9 mmol/L (ref 3.5–5.1)
SODIUM: 136 mmol/L (ref 135–145)

## 2017-09-10 MED ORDER — ASPIRIN 325 MG PO TBEC: 325.0000 mg | DELAYED_RELEASE_TABLET | Freq: Every day | ORAL | 0 refills | Status: DC

## 2017-09-10 MED ORDER — OXYCODONE HCL 5 MG PO TABS: 5.0000 mg | ORAL_TABLET | ORAL | 0 refills | Status: DC | PRN

## 2017-09-10 MED ORDER — SODIUM CHLORIDE 0.9 % IV BOLUS
500.0000 mL | Freq: Once | INTRAVENOUS | Status: AC
  Administered 2017-09-10: 500 mL via INTRAVENOUS

## 2017-09-10 MED ORDER — METHOCARBAMOL 500 MG PO TABS: 500.0000 mg | ORAL_TABLET | Freq: Four times a day (QID) | ORAL | 0 refills | Status: DC | PRN

## 2017-09-10 NOTE — Progress Notes (Signed)
Clinical Social Worker (CSW) received SNF consult. PT is recommending home health. RN case manager aware of above. Please reconsult if future social work needs arise. CSW signing off.   Christyl Osentoski, LCSW [phone] 

## 2017-09-10 NOTE — Progress Notes (Signed)
Physical Therapy Treatment Patient Details Name: Matthew Randolph MRN: [id number] DOB: Jan 24, 1940 Today's Date: 09/10/2017    History of Present Illness 77 y/o male s/p R total hip replacement (AVN, anterior approach) 9/5.      PT Comments    Pt continues to show great effort with PT sessions and is eager to do all he can.  He was able to fully circumambulate the nurses' station with need for only occasional cues for posture, step length, consistent cadence.  He was also able to negotiate up/down 2 steps (no rails) using walker and backward strategy.  Pt c/o pain t/o the effort ("9/10") but did not have any grimacing, wincing or excessive guarding.  Pt doing well, should be ready to go home tomorrow once medically cleared.    Follow Up Recommendations  Home health PT     Equipment Recommendations  3in1 (PT);Rolling walker with 5" wheels    Recommendations for Other Services       Precautions / Restrictions Precautions Precautions: Anterior Hip;Fall Restrictions Weight Bearing Restrictions: Yes RLE Weight Bearing: Weight bearing as tolerated    Mobility  Bed Mobility Overal bed mobility: Modified Independent             General bed mobility comments: in recliner on arrival, returned to recliner  Transfers Overall transfer level: Modified independent Equipment used: Rolling walker (2 wheeled)             General transfer comment: Pt better with remembering appropriate set up and execution  Ambulation/Gait Ambulation/Gait assistance: Supervision Gait Distance (Feet): 250 Feet Assistive device: Rolling walker (2 wheeled)       General Gait Details: Pt with slow but steady and safe ambulation t/o the session.  Overall was was more consistent with gait speed and cadence, pt did report being quite fatigued after the effort.    Stairs Stairs: Yes Stairs assistance: Supervision Stair Management: No rails;Backwards;With walker Number of Stairs: 2 General stair  comments: Pt was able to negotiate up/down 2 steps with walker (and w/o phyiscal assist) once cued on appropriate strategy    Wheelchair Mobility    Modified Rankin (Stroke Patients Only)       Balance Overall balance assessment: Modified Independent                                          Cognition Arousal/Alertness: Awake/alert Behavior During Therapy: WFL for tasks assessed/performed Overall Cognitive Status: Within Functional Limits for tasks assessed                                        Exercises Total Joint Exercises Ankle Circles/Pumps: 10 reps;Strengthening Quad Sets: Strengthening;10 reps Gluteal Sets: Strengthening;10 reps Short Arc Quad: Strengthening;15 reps Heel Slides: Strengthening;10 reps Hip ABduction/ADduction: Strengthening;10 reps Straight Leg Raises: AAROM;10 reps Long Arc Quad: Strengthening;10 reps Knee Flexion: Strengthening;10 reps Marching in Standing: Seated;Strengthening;10 reps Other Exercises Other Exercises: pt educated in falls prevention strategies to maximize safety upon return home    General Comments        Pertinent Vitals/Pain Pain Assessment: 0-10 Pain Score: 9  Pain Location: Pt clearly appears sore/having pain, but did not have typical indicators of severity of pain per subjective rating Pain Descriptors / Indicators: Aching Pain Intervention(s): Limited activity within patient's tolerance;Monitored during session;Repositioned;Premedicated  before session    [place] expects to be discharged to:: Private residence Living Arrangements: Spouse/significant other Available Help at Discharge: Family;Available 24 hours/day Type of Home: House Home Access: Stairs to enter Entrance Stairs-Rails: None Home Layout: One level Home Equipment: None      Prior Function Level of Independence: Independent      Comments: Pt has been limping secondary to AVN R hip pain, but  generally is able to do all he needs. No falls. Drives. Retired.   PT Goals (current goals can now be found in the care plan section) Acute Rehab PT Goals Patient Stated Goal: go home Progress towards PT goals: Progressing toward goals    Frequency    BID      PT Plan Current plan remains appropriate    Co-evaluation              AM-PAC PT "6 Clicks" Daily Activity  Outcome Measure  Difficulty turning over in bed (including adjusting bedclothes, sheets and blankets)?: None Difficulty moving from lying on back to sitting on the side of the bed? : None Difficulty sitting down on and standing up from a chair with arms (e.g., wheelchair, bedside commode, etc,.)?: A Little Help needed moving to and from a bed to chair (including a wheelchair)?: None Help needed walking in hospital room?: None Help needed climbing 3-5 steps with a railing? : A Little 6 Click Score: 22    End of Session Equipment Utilized During Treatment: Gait belt Activity Tolerance: Patient tolerated treatment well Patient left: with chair alarm set;with call bell/phone within reach;with family/visitor present Nurse Communication: Mobility status PT Visit Diagnosis: Muscle weakness (generalized) (M62.81);Difficulty in walking, not elsewhere classified (R26.2)     Time: 2992-4268 PT Time Calculation (min) (ACUTE ONLY): 43 min  Charges:  $Gait Training: 23-37 mins $Therapeutic Exercise: 8-22 mins                     Kreg Shropshire, DPT 09/10/2017, 3:20 PM

## 2017-09-10 NOTE — Evaluation (Signed)
Occupational Therapy Evaluation Patient Details Name: Matthew Randolph MRN: [id number] DOB: [birth year] Today's Date: 09/10/2017    History of Present Illness 77 y/o male s/p R total hip replacement (AVN, anterior approach) 9/5.     Clinical Impression   Pt seen for OT evaluation this date, POD#1 from above surgery. Pt was independent in all ADLs prior to surgery. Pt is eager to return to PLOF with less pain and improved safety and independence. Pt currently requires minimal assist for LB dressing and bathing while in seated position due to pain and limited AROM of R hip. Pt instructed in self care skills, falls prevention strategies, home/routines modifications, DME/AE for LB bathing and dressing tasks, and compression stocking mgt strategies. Pt would benefit from additional skilled OT services while in the hospital for instruction in self care skills and techniques to help maintain precautions with or without assistive devices to support recall and carryover prior to discharge. Do not anticipate any skilled OT needs upon discharge.      Follow Up Recommendations  No OT follow up    Equipment Recommendations  3 in 1 bedside commode    Recommendations for Other Services       Precautions / Restrictions Precautions Precautions: Anterior Hip;Fall Restrictions Weight Bearing Restrictions: Yes RLE Weight Bearing: Weight bearing as tolerated      Mobility Bed Mobility               General bed mobility comments: deferred, up in recliner  Transfers Overall transfer level: Modified independent Equipment used: Rolling walker (2 wheeled)             General transfer comment: supervision with no LOB or overt difficulty to perform    Balance Overall balance assessment: Modified Independent                                         ADL either performed or assessed with clinical judgement   ADL Overall ADL's : Needs assistance/impaired Eating/Feeding:  Sitting;Independent   Grooming: Standing;Min guard   Upper Body Bathing: Sitting;Supervision/ safety   Lower Body Bathing: Sit to/from stand;Minimal assistance;With caregiver independent assisting   Upper Body Dressing : Sitting;Supervision/safety   Lower Body Dressing: Sit to/from stand;Minimal assistance Lower Body Dressing Details (indicate cue type and reason): pt educated in AE for LB dressing with pt able to return demo technique; pt educated in compression stocking mgt  Toilet Transfer: RW;Min Psychiatric nurse Details (indicate cue type and reason): pt educated in use of urinal for overnight toileting needs and BSC frame over std height toilet at home to improve safety and independence with toilet transfers                Vision Baseline Vision/History: Wears glasses Wears Glasses: Reading only Patient Visual Report: No change from baseline       Perception     Praxis      Pertinent Vitals/Pain Pain Assessment: 0-10 Pain Score: 7  Pain Location: at rest in R hip, up to 10/10 with movement Pain Descriptors / Indicators: Aching Pain Intervention(s): Limited activity within patient's tolerance;Monitored during session;Repositioned;Premedicated before session     Hand Dominance     Extremity/Trunk Assessment Upper Extremity Assessment Upper Extremity Assessment: Overall WFL for tasks assessed   Lower Extremity Assessment Lower Extremity Assessment: Defer to PT evaluation(LLE WFL, RLE with expected post-op strength/ROM deficits)   Cervical /  Trunk Assessment Cervical / Trunk Assessment: Normal   Communication Communication Communication: No difficulties   Cognition Arousal/Alertness: Awake/alert Behavior During Therapy: WFL for tasks assessed/performed Overall Cognitive Status: Within Functional Limits for tasks assessed                                     General Comments       Exercises Other Exercises Other Exercises: pt  educated in falls prevention strategies to maximize safety upon return home   Shoulder Instructions      Home Living Family/patient expects to be discharged to:: Private residence Living Arrangements: Spouse/significant other Available Help at Discharge: Family;Available 24 hours/day Type of Home: House Home Access: Stairs to enter [employer] of Steps: 2 Entrance Stairs-Rails: None Home Layout: One level     Bathroom Shower/Tub: Teacher, early years/pre: Standard     Home Equipment: None          Prior Functioning/Environment Level of Independence: Independent        Comments: Pt has been limping secondary to AVN R hip pain, but generally is able to do all he needs. No falls. Drives. Retired.        OT Problem List: Decreased strength;Decreased knowledge of use of DME or AE;Pain;Decreased range of motion      OT Treatment/Interventions: Self-care/ADL training;Balance training;Therapeutic exercise;Therapeutic activities;DME and/or AE instruction;Patient/family education    OT Goals(Current goals can be found in the care plan section) Acute Rehab OT Goals Patient Stated Goal: go home OT Goal Formulation: With patient Time For Goal Achievement: 09/24/17 Potential to Achieve Goals: Good ADL Goals Pt Will Perform Lower Body Dressing: with adaptive equipment;sit to/from stand;with modified independence Pt Will Transfer to Toilet: with supervision;ambulating(BSC over toilet, LRAD for amb) Additional ADL Goal #1: Pt will independently instruct spouse in compression stocking mgt including donning/doffing wear schedule, and positioning.  OT Frequency: Min 1X/week   Barriers to D/C:            Co-evaluation              AM-PAC PT "6 Clicks" Daily Activity     Outcome Measure Help from another person eating meals?: None Help from another person taking care of personal grooming?: None Help from another person toileting, which includes using  toliet, bedpan, or urinal?: A Little Help from another person bathing (including washing, rinsing, drying)?: A Little Help from another person to put on and taking off regular upper body clothing?: None Help from another person to put on and taking off regular lower body clothing?: A Little 6 Click Score: 21   End of Session Equipment Utilized During Treatment: Gait belt;Rolling walker  Activity Tolerance: Patient tolerated treatment well Patient left: in chair;with call bell/phone within reach;with chair alarm set  OT Visit Diagnosis: Other abnormalities of gait and mobility (R26.89);Pain Pain - Right/Left: Right Pain - part of body: Leg;Hip                Time: 8768-1157 OT Time Calculation (min): 20 min Charges:  OT General Charges $OT Visit: 1 Visit OT Evaluation $OT Eval Low Complexity: 1 Low OT Treatments $Self Care/Home Management : 8-22 mins  Jeni Salles, MPH, MS, OTR/L ascom [phone] 09/10/17, 11:48 AM

## 2017-09-10 NOTE — Progress Notes (Signed)
Physical Therapy Treatment Patient Details Name: Matthew Randolph MRN: [id number] DOB: [birth year] Today's Date: 09/10/2017    History of Present Illness 77 y/o male s/p R total hip replacement (AVN, anterior approach) 9/5.      PT Comments    Pt continues to show great motivation and effort with PT. He is eager to do all he can and is making good POD1 gains.  He still has expected difficulty with SLRs (needing AAROM despite great effort) but was able to ambulate >100 ft and generally showed good mobility.  Despite c/o severe pain he does not appear overly pain limited, expect pt to continue to doing well, will try steps and longer bout of ambulation this afternoon.    Follow Up Recommendations  Home health PT     Equipment Recommendations  3in1 (PT);Rolling walker with 5" wheels    Recommendations for Other Services       Precautions / Restrictions Precautions Precautions: Anterior Hip;Fall Restrictions Weight Bearing Restrictions: Yes RLE Weight Bearing: Weight bearing as tolerated    Mobility  Bed Mobility Overal bed mobility: Modified Independent             General bed mobility comments: slow but no assist needed to get to sitting  Transfers Overall transfer level: Modified independent Equipment used: Rolling walker (2 wheeled)             General transfer comment: Cues for UEs and foot placement as well as strategies/sequencing to get to/from standing  Ambulation/Gait Ambulation/Gait assistance: Min guard Gait Distance (Feet): 125 Feet Assistive device: Rolling walker (2 wheeled)       General Gait Details: Pt with initially very slow, hesitant ambulation that improved with increased distance. No safety issues and generally seemed to be able to trust R LE WBing, etc   Stairs             Wheelchair Mobility    Modified Rankin (Stroke Patients Only)       Balance Overall balance assessment: Modified Independent                                          Cognition Arousal/Alertness: Awake/alert Behavior During Therapy: WFL for tasks assessed/performed Overall Cognitive Status: Within Functional Limits for tasks assessed                                        Exercises Total Joint Exercises Ankle Circles/Pumps: AROM;10 reps Quad Sets: Strengthening;10 reps Gluteal Sets: Strengthening;10 reps Short Arc Quad: Strengthening;15 reps Heel Slides: Strengthening;10 reps Hip ABduction/ADduction: Strengthening;10 reps Straight Leg Raises: AAROM;10 reps   General Comments        Pertinent Vitals/Pain Pain Assessment: 0-10 Pain Score: 8  Pain Location: Pt clearly had some pain, but did not have typical indicators of severity of pain per subjective rating Pain Descriptors / Indicators: Aching Pain Intervention(s): Limited activity within patient's tolerance;Monitored during session;Repositioned;Premedicated before session    Home Living Family/patient expects to be discharged to:: Private residence Living Arrangements: Spouse/significant other Available Help at Discharge: Family;Available 24 hours/day Type of Home: House Home Access: Stairs to enter Entrance Stairs-Rails: None Home Layout: One level Home Equipment: None      Prior Function Level of Independence: Independent      Comments: Pt has been limping  secondary to AVN R hip pain, but generally is able to do all he needs. No falls. Drives. Retired.   PT Goals (current goals can now be found in the care plan section) Acute Rehab PT Goals Patient Stated Goal: go home Progress towards PT goals: Progressing toward goals    Frequency    BID      PT Plan Current plan remains appropriate    Co-evaluation              AM-PAC PT "6 Clicks" Daily Activity  Outcome Measure  Difficulty turning over in bed (including adjusting bedclothes, sheets and blankets)?: None Difficulty moving from lying on back to sitting on the  side of the bed? : None Difficulty sitting down on and standing up from a chair with arms (e.g., wheelchair, bedside commode, etc,.)?: A Little Help needed moving to and from a bed to chair (including a wheelchair)?: None Help needed walking in hospital room?: None Help needed climbing 3-5 steps with a railing? : A Little 6 Click Score: 22    End of Session Equipment Utilized During Treatment: Gait belt Activity Tolerance: Patient tolerated treatment well Patient left: with chair alarm set;with call bell/phone within reach;with family/visitor present Nurse Communication: Mobility status PT Visit Diagnosis: Muscle weakness (generalized) (M62.81);Difficulty in walking, not elsewhere classified (R26.2)     Time: 6222-9798 PT Time Calculation (min) (ACUTE ONLY): 30 min  Charges:  $Gait Training: 8-22 mins $Therapeutic Exercise: 8-22 mins                     Kreg Shropshire, DPT 09/10/2017, 12:02 PM

## 2017-09-10 NOTE — Progress Notes (Addendum)
   Subjective: 1 Day Post-Op Procedure(s) (LRB): TOTAL HIP ARTHROPLASTY ANTERIOR APPROACH (Right) Patient reports pain as mild.   Patient is well, and has had no acute complaints or problems Denies any CP, SOB, ABD pain. We will continue therapy today.  Plan is to go Home after hospital stay.  Objective: Vital signs in last 24 hours: Temp:  [96.9 F (36.1 C)-99.2 F (37.3 C)] 98.8 F (37.1 C) (09/06 0746) Pulse Rate:  [50-84] 64 (09/06 0746) Resp:  [9-18] 18 (09/06 0746) BP: (74-150)/(34-82) 102/58 (09/06 0746) SpO2:  [96 %-100 %] 97 % (09/06 0746) Weight:  [96.2 kg] 96.2 kg (09/05 0818)  Intake/Output from previous day: 09/05 0701 - 09/06 0700 In: 2980.5 [P.O.:240; I.V.:2440.5; IV Piggyback:300] Out: 1550 [Urine:1250; Blood:300] Intake/Output this shift: No intake/output data recorded.  Recent Labs    09/10/17 0417  HGB 10.1*   Recent Labs    09/10/17 0417  WBC 7.1  RBC 3.52*  HCT 30.4*  PLT 235   Recent Labs    09/10/17 0417  NA 136  K 3.9  CL 106  CO2 25  BUN 16  CREATININE 1.13  GLUCOSE 132*  CALCIUM 8.4*   No results for input(s): LABPT, INR in the last 72 hours.  EXAM General - Patient is Alert, Appropriate and Oriented Extremity - Neurovascular intact Sensation intact distally Intact pulses distally Dorsiflexion/Plantar flexion intact No cellulitis present Compartment soft Dressing - dressing C/D/I and no drainage, wound vac intact with out drainage Motor Function - intact, moving foot and toes well on exam.   Past Medical History:  Diagnosis Date  . Chronic kidney disease   . ED (erectile dysfunction)   . Erectile dysfunction   . GERD (gastroesophageal reflux disease)   . High risk sexual behavior   . Hyperlipidemia   . Hypertension   . Tobacco use disorder, continuous     Assessment/Plan:   1 Day Post-Op Procedure(s) (LRB): TOTAL HIP ARTHROPLASTY ANTERIOR APPROACH (Right) Active Problems:   Status post total hip replacement,  right  Estimated body mass index is 28.76 kg/m as calculated from the following:   Height as of this encounter: 6' (1.829 m).   Weight as of this encounter: 96.2 kg. Advance diet Up with therapy  Needs BM Acute post op blood loss anemia - Recheck labs in the am Hypotension-hold lisinopril CM to assist with discharge to home with HHPT Saturday.   DVT Prophylaxis - Aspirin, TED hose and SCDs Weight-Bearing as tolerated to right leg   T. Rachelle Hora, PA-C [place] 09/10/2017, 7:58 AM

## 2017-09-10 NOTE — Progress Notes (Signed)
Blood pressure 87/52. Dr Rudene Christians notified. Received order for 500 cc NS bolus over 1 hour

## 2017-09-10 NOTE — Plan of Care (Signed)

## 2017-09-10 NOTE — Discharge Instructions (Signed)
ANTERIOR APPROACH TOTAL HIP REPLACEMENT POSTOPERATIVE DIRECTIONS   Hip Rehabilitation, Guidelines Following Surgery  The results of a hip operation are greatly improved after range of motion and muscle strengthening exercises. Follow all safety measures which are given to protect your hip. If any of these exercises cause increased pain or swelling in your joint, decrease the amount until you are comfortable again. Then slowly increase the exercises. Call your caregiver if you have problems or questions.   HOME CARE INSTRUCTIONS  Remove items at home which could result in a fall. This includes throw rugs or furniture in walking pathways.   ICE to the affected hip every three hours for 30 minutes at a time and then as needed for pain and swelling.  Continue to use ice on the hip for pain and swelling from surgery. You may notice swelling that will progress down to the foot and ankle.  This is normal after surgery.  Elevate the leg when you are not up walking on it.    Continue to use the breathing machine which will help keep your temperature down.  It is common for your temperature to cycle up and down following surgery, especially at night when you are not up moving around and exerting yourself.  The breathing machine keeps your lungs expanded and your temperature down.  Do not place pillow under knee, focus on keeping the knee straight while resting  DIET You may resume your previous home diet once your are discharged from the hospital.  DRESSING / WOUND CARE / SHOWERING Please remove provena negative pressure dressing on 09/18/2017 and apply honey comb dressing. Keep dressing clean and dry at all times.  ACTIVITY Walk with your walker as instructed. Use walker as long as suggested by your caregivers. Avoid periods of inactivity such as sitting longer than an hour when not asleep. This helps prevent blood clots.  You may resume a sexual relationship in one month or when given the OK by  your doctor.  You may return to work once you are cleared by your doctor.  Do not drive a car for 6 weeks or until released by you surgeon.  Do not drive while taking narcotics.  WEIGHT BEARING Weight bearing as tolerated. Use walker/cane as needed for at least 4 weeks post op.  POSTOPERATIVE CONSTIPATION PROTOCOL Constipation - defined medically as fewer than three stools per week and severe constipation as less than one stool per week.  One of the most common issues patients have following surgery is constipation.  Even if you have a regular bowel pattern at home, your normal regimen is likely to be disrupted due to multiple reasons following surgery.  Combination of anesthesia, postoperative narcotics, change in appetite and fluid intake all can affect your bowels.  In order to avoid complications following surgery, here are some recommendations in order to help you during your recovery period.  Colace (docusate) - Pick up an over-the-counter form of Colace or another stool softener and take twice a day as long as you are requiring postoperative pain medications.  Take with a full glass of water daily.  If you experience loose stools or diarrhea, hold the colace until you stool forms back up.  If your symptoms do not get better within 1 week or if they get worse, check with your doctor.  Dulcolax (bisacodyl) - Pick up over-the-counter and take as directed by the product packaging as needed to assist with the movement of your bowels.  Take with a full  glass of water.  Use this product as needed if not relieved by Colace only.  ° °MiraLax (polyethylene glycol) - Pick up over-the-counter to have on hand.  MiraLax is a solution that will increase the amount of water in your bowels to assist with bowel movements.  Take as directed and can mix with a glass of water, juice, soda, coffee, or tea.  Take if you go more than two days without a movement. °Do not use MiraLax more than once per day. Call your  doctor if you are still constipated or irregular after using this medication for 7 days in a row. ° °If you continue to have problems with postoperative constipation, please contact the office for further assistance and recommendations.  If you experience "the worst abdominal pain ever" or develop nausea or vomiting, please contact the office immediatly for further recommendations for treatment. ° °ITCHING ° If you experience itching with your medications, try taking only a single pain pill, or even half a pain pill at a time.  You can also use Benadryl over the counter for itching or also to help with sleep.  ° °TED HOSE STOCKINGS °Wear the elastic stockings on both legs for six weeks following surgery during the day but you may remove then at night for sleeping. ° °MEDICATIONS °See your medication summary on the “After Visit Summary” that the nursing staff will review with you prior to discharge.  You may have some home medications which will be placed on hold until you complete the course of blood thinner medication.  It is important for you to complete the blood thinner medication as prescribed by your surgeon.  Continue your approved medications as instructed at time of discharge. ° °PRECAUTIONS °If you experience chest pain or shortness of breath - call 911 immediately for transfer to the hospital emergency department.  °If you develop a fever greater that 101 F, purulent drainage from wound, increased redness or drainage from wound, foul odor from the wound/dressing, or calf pain - CONTACT YOUR SURGEON.   °                                                °FOLLOW-UP APPOINTMENTS °Make sure you keep all of your appointments after your operation with your surgeon and caregivers. You should call the office at the above phone number and make an appointment for approximately two weeks after the date of your surgery or on the date instructed by your surgeon outlined in the "After Visit Summary". ° °RANGE OF MOTION  AND STRENGTHENING EXERCISES  °These exercises are designed to help you keep full movement of your hip joint. Follow your caregiver's or physical therapist's instructions. Perform all exercises about fifteen times, three times per day or as directed. Exercise both hips, even if you have had only one joint replacement. These exercises can be done on a training (exercise) mat, on the floor, on a table or on a bed. Use whatever works the best and is most comfortable for you. Use music or television while you are exercising so that the exercises are a pleasant break in your day. This will make your life better with the exercises acting as a break in routine you can look forward to.  °Lying on your back, slowly slide your foot toward your buttocks, raising your knee up off the floor. Then slowly   slide your foot back down until your leg is straight again.  Lying on your back spread your legs as far apart as you can without causing discomfort.  Lying on your side, raise your upper leg and foot straight up from the floor as far as is comfortable. Slowly lower the leg and repeat.  Lying on your back, tighten up the muscle in the front of your thigh (quadriceps muscles). You can do this by keeping your leg straight and trying to raise your heel off the floor. This helps strengthen the largest muscle supporting your knee.  Lying on your back, tighten up the muscles of your buttocks both with the legs straight and with the knee bent at a comfortable angle while keeping your heel on the floor.   IF YOU ARE TRANSFERRED TO A SKILLED REHAB FACILITY If the patient is transferred to a skilled rehab facility following release from the hospital, a list of the current medications will be sent to the facility for the patient to continue.  When discharged from the skilled rehab facility, please have the facility set up the patient's [place] prior to being released. Also, the skilled facility will be responsible  for providing the patient with their medications at time of release from the facility to include their pain medication, the muscle relaxants, and their blood thinner medication. If the patient is still at the rehab facility at time of the two week follow up appointment, the skilled rehab facility will also need to assist the patient in arranging follow up appointment in our office and any transportation needs.  MAKE SURE YOU:  Understand these instructions.  Get help right away if you are not doing well or get worse.    Pick up stool softner and laxative for home use following surgery while on pain medications. Continue to use ice for pain and swelling after surgery. Do not use any lotions or creams on the incision until instructed by your surgeon.

## 2017-09-10 NOTE — Anesthesia Postprocedure Evaluation (Signed)
Anesthesia Post Note  Patient: RICHMOND COLDREN  Procedure(s) Performed: TOTAL HIP ARTHROPLASTY ANTERIOR APPROACH (Right )  Patient location during evaluation: Nursing Unit Anesthesia Type: Spinal Level of consciousness: awake, awake and alert and oriented Pain management: pain level controlled Vital Signs Assessment: post-procedure vital signs reviewed and stable Respiratory status: spontaneous breathing, nonlabored ventilation and respiratory function stable Cardiovascular status: blood pressure returned to baseline and stable Postop Assessment: no headache and no backache Anesthetic complications: no     Last Vitals:  Vitals:   09/10/17 0338 09/10/17 0651  BP: (!) 87/53 115/67  Pulse: 61 62  Resp:    Temp:    SpO2: 98%     Last Pain:  Vitals:   09/10/17 0334  TempSrc: Oral  PainSc:                  Johnna Acosta

## 2017-09-10 NOTE — Discharge Summary (Signed)
Physician Discharge Summary  Patient ID: Matthew Randolph MRN: [id number] DOB/AGE: [birth year] 77 y.o.  Admit date: 09/09/2017 Discharge date: 09/11/2017 Admission Diagnoses:  AVASCULAR NECROSIS OF BONE RIGHT HIP   Discharge Diagnoses: Patient Active Problem List   Diagnosis Date Noted  . Status post total hip replacement, right 09/09/2017  . Elevated PSA 08/19/2016  . Advance care planning 08/19/2016  . Anemia 08/19/2016  . GERD (gastroesophageal reflux disease) 08/19/2016  . Osteoarthritis of left hip 02/17/2016  . Primary osteoarthritis of left knee 02/14/2016  . Hip pain 01/23/2015  . Strain of psoas muscle 09/26/2014  . Tobacco abuse 04/19/2014  . Benign essential HTN 04/19/2014  . ED (erectile dysfunction) 04/19/2014  . CKD (chronic kidney disease) 04/19/2014  . High risk sexual behavior 04/19/2014  . Hyperlipemia 04/19/2014    Past Medical History:  Diagnosis Date  . Chronic kidney disease   . ED (erectile dysfunction)   . Erectile dysfunction   . GERD (gastroesophageal reflux disease)   . High risk sexual behavior   . Hyperlipidemia   . Hypertension   . Tobacco use disorder, continuous      Transfusion: none   Consultants (if any):   Discharged Condition: Improved  Hospital Course: Matthew Randolph is an 77 y.o. male who was admitted 09/09/2017 with a diagnosis of right hip avascular necrosis and went to the operating room on 09/09/2017 and underwent the above named procedures.    Surgeries: Procedure(s): TOTAL HIP ARTHROPLASTY ANTERIOR APPROACH on 09/09/2017 Patient tolerated the surgery well. Taken to PACU where she was stabilized and then transferred to the orthopedic floor.  Started on aspirin 325 mg daily with SCDs and TED hose.  Heels elevated on bed with rolled towels. No evidence of DVT. Negative [place]oman. Physical therapy started on day #1 for gait training and transfer. OT started day #1 for ADL and assisted devices.  Patient's foley was d/c on day #1.  Patient's IV was d/c on day #2.  On post op day #2 patient was stable and ready for discharge to home with home health PT.  Implants: Medacta AMIS 5 standard stem with 58 mm Mpact TM cup and liner with millimeters metal 28 mm head  Please remove provena negative pressure dressing on 09/18/2017 and apply honey comb dressing. Keep dressing clean and dry at all times.   He was given perioperative antibiotics:  Anti-infectives (From admission, onward)   Start     Dose/Rate Route Frequency Ordered Stop   09/09/17 1530  ceFAZolin (ANCEF) IVPB 2g/100 mL premix     2 g 200 mL/hr over 30 Minutes Intravenous Every 6 hours 09/09/17 1304 09/10/17 0421   09/09/17 0805  ceFAZolin (ANCEF) 2-4 GM/100ML-% IVPB    Note to Pharmacy:  Milinda Cave   : cabinet override      09/09/17 0805 09/09/17 0931   09/09/17 0530  ceFAZolin (ANCEF) IVPB 2g/100 mL premix     2 g 200 mL/hr over 30 Minutes Intravenous  Once 09/09/17 0525 09/09/17 1001    .  He was given sequential compression devices, early ambulation, and aspirin for DVT prophylaxis.  He benefited maximally from the hospital stay and there were no complications.    Recent vital signs:  Vitals:   09/10/17 0651 09/10/17 0746  BP: 115/67 (!) 102/58  Pulse: 62 64  Resp:  18  Temp:  98.8 F (37.1 C)  SpO2:  97%    Recent laboratory studies:  Lab Results  Component Value Date  HGB 10.1 (L) 09/10/2017   HGB 12.6 (L) 09/02/2017   HGB 12.5 (L) 08/20/2017   Lab Results  Component Value Date   WBC 7.1 09/10/2017   PLT 235 09/10/2017   Lab Results  Component Value Date   INR 1.00 09/02/2017   Lab Results  Component Value Date   NA 136 09/10/2017   K 3.9 09/10/2017   CL 106 09/10/2017   CO2 25 09/10/2017   BUN 16 09/10/2017   CREATININE 1.13 09/10/2017   GLUCOSE 132 (H) 09/10/2017    Discharge Medications:   Allergies as of 09/10/2017   No Known Allergies     Medication List    STOP taking these medications   meloxicam 15  MG tablet Commonly known as:  MOBIC     TAKE these medications   aspirin 325 MG EC tablet Take 1 tablet (325 mg total) by mouth daily with breakfast.   atorvastatin 10 MG tablet Commonly known as:  LIPITOR TAKE 1 TABLET DAILY What changed:  when to take this   lisinopril 5 MG tablet Commonly known as:  PRINIVIL,ZESTRIL TAKE 1 TABLET DAILY   methocarbamol 500 MG tablet Commonly known as:  ROBAXIN Take 1 tablet (500 mg total) by mouth every 6 (six) hours as needed for muscle spasms.   multivitamin tablet Take 1 tablet by mouth daily.   oxyCODONE 5 MG immediate release tablet Commonly known as:  Oxy IR/ROXICODONE Take 1-2 tablets (5-10 mg total) by mouth every 4 (four) hours as needed for moderate pain (pain score 4-6).   pantoprazole 40 MG tablet Commonly known as:  PROTONIX Take 1 tablet (40 mg total) by mouth daily as needed.            Durable Medical Equipment  (From admission, onward)         Start     Ordered   09/09/17 1305  DME Walker rolling  Once    Question:  Patient needs a walker to treat with the following condition  Answer:  Status post total hip replacement, right   09/09/17 1304   09/09/17 1305  DME Bedside commode  Once    Question:  Patient needs a bedside commode to treat with the following condition  Answer:  Status post total hip replacement, right   09/09/17 1304   09/09/17 1305  DME 3 n 1  Once     09/09/17 1304          Diagnostic Studies: Dg Hip Operative Unilat W Or W/o Pelvis Right  Result Date: 09/09/2017 CLINICAL DATA:  Right hip replacement. EXAM: OPERATIVE RIGHT HIP (WITH PELVIS IF PERFORMED) 2 VIEWS TECHNIQUE: Fluoroscopic spot image(s) were submitted for interpretation post-operatively. COMPARISON:  None. FINDINGS: Two C-arm views of the right hip demonstrate a hip prosthesis in satisfactory position and alignment. No fracture or dislocation seen. IMPRESSION: Satisfactory appearance of a right hip prosthesis. Electronically  Signed   By: Claudie Revering M.D.   On: 09/09/2017 12:22   Dg Hip Unilat W Or W/o Pelvis 2-3 Views Right  Result Date: 09/09/2017 CLINICAL DATA:  Status post right total hip replacement. EXAM: DG HIP (WITH OR WITHOUT PELVIS) 2-3V RIGHT COMPARISON:  None. FINDINGS: Right hip prosthesis in satisfactory position and alignment. No fracture or dislocation seen. IMPRESSION: Satisfactory postoperative appearance of a right hip prosthesis. Electronically Signed   By: Claudie Revering M.D.   On: 09/09/2017 12:21    Disposition:     Follow-up Information    Dorise Hiss  C, PA-C Follow up in 2 week(s).   Specialties:  Orthopedic Surgery, Emergency Medicine Contact information: [place] [address] [phone]            Signed: Feliberto Gottron 09/10/2017, 8:07 AM

## 2017-09-10 NOTE — Care Management Note (Addendum)
Case Management Note  Patient Details  Name: Matthew Randolph MRN: [id number] Date of Birth: Feb 12, 1940  Subjective/Objective:                  RNCM met with patient to offer home health agency choice. He does not have preference as long as his insurance will cover it. He intends to return home with his friend Rollene Fare to address [address] [place]   [phone] or [phone]. He requests a rolling walker and bedside commode which will be delivered Saturday 09/11/17.  Action/Plan: .  Patient would [facility] to use [facility] for [facility]. Rolling walker and bedside commode also requested from [facility].   Expected Discharge Date:                  Expected Discharge Plan:     In-House Referral:     Discharge planning Services  CM Consult  Post Acute Care Choice:  Home Health, Durable Medical Equipment Choice offered to:  Patient  DME Arranged:  Walker rolling DME Agency:  [place]:  PT [facility] Agency:     Status of Service:  In process, will continue to follow  If discussed at Long Length of Stay Meetings, dates discussed:    Additional Comments:  Marshell Garfinkel, RN 09/10/2017, 1:51 PM

## 2017-09-10 NOTE — Progress Notes (Signed)
Pt received NS 500 cc bolus.. After bolus blood pressure is 115/67.

## 2017-09-11 LAB — CBC
HEMATOCRIT: 30.7 % — AB (ref 40.0–52.0)
HEMOGLOBIN: 10.2 g/dL — AB (ref 13.0–18.0)
MCH: 28.8 pg (ref 26.0–34.0)
MCHC: 33.2 g/dL (ref 32.0–36.0)
MCV: 86.6 fL (ref 80.0–100.0)
Platelets: 214 10*3/uL (ref 150–440)
RBC: 3.54 MIL/uL — AB (ref 4.40–5.90)
RDW: 13.5 % (ref 11.5–14.5)
WBC: 10 10*3/uL (ref 3.8–10.6)

## 2017-09-11 LAB — BASIC METABOLIC PANEL
ANION GAP: 4 — AB (ref 5–15)
BUN: 20 mg/dL (ref 8–23)
CHLORIDE: 104 mmol/L (ref 98–111)
CO2: 26 mmol/L (ref 22–32)
CREATININE: 1.6 mg/dL — AB (ref 0.61–1.24)
Calcium: 8.7 mg/dL — ABNORMAL LOW (ref 8.9–10.3)
GFR calc non Af Amer: 41 mL/min — ABNORMAL LOW (ref >60)
GFR, EST AFRICAN AMERICAN: 47 mL/min — AB (ref >60)
Glucose, Bld: 107 mg/dL — ABNORMAL HIGH (ref 70–99)
POTASSIUM: 3.8 mmol/L (ref 3.5–5.1)
SODIUM: 134 mmol/L — AB (ref 135–145)

## 2017-09-11 NOTE — Progress Notes (Signed)
Pt. Discharged to Home by wife. Discharge instructions and medication regimen reviewed at bedside with patient. Pt. verbalizes understanding of instructions and medication regimen. Prescriptions given to pt. RN changed wound vac dressing to honeycomb dressing and RN gave dressings to pt to take home. Patient assessment unchanged from this morning. IV discontinued per policy.

## 2017-09-11 NOTE — Progress Notes (Signed)
   Subjective: 2 Days Post-Op Procedure(s) (LRB): TOTAL HIP ARTHROPLASTY ANTERIOR APPROACH (Right) Patient reports pain as mild.   Patient is well, and has had no acute complaints or problems Patient did very well with physical therapy yesterday.  Was able to make a lap around the nurses desk and do steps.  Has met all goals to go home Plan is to go Home after hospital stay. no nausea and no vomiting Patient denies any chest pains or shortness of breath. Objective: Vital signs in last 24 hours: Temp:  [97.6 F (36.4 C)-98.5 F (36.9 C)] 97.6 F (36.4 C) (09/07 0005) Pulse Rate:  [63-72] 63 (09/07 0005) Resp:  [18] 18 (09/07 0005) BP: (109-142)/(55-66) 109/55 (09/07 0005) SpO2:  [95 %-100 %] 100 % (09/07 0005) well approximated incision Heels are non tender and elevated off the bed using rolled towels Intake/Output from previous day: 09/06 0701 - 09/07 0700 In: 1197.5 [P.O.:480; I.V.:217.5; IV Piggyback:500] Out: 300 [Urine:300] Intake/Output this shift: No intake/output data recorded.  Recent Labs    09/10/17 0417 09/11/17 0340  HGB 10.1* 10.2*   Recent Labs    09/10/17 0417 09/11/17 0340  WBC 7.1 10.0  RBC 3.52* 3.54*  HCT 30.4* 30.7*  PLT 235 214   Recent Labs    09/10/17 0417 09/11/17 0340  NA 136 134*  K 3.9 3.8  CL 106 104  CO2 25 26  BUN 16 20  CREATININE 1.13 1.60*  GLUCOSE 132* 107*  CALCIUM 8.4* 8.7*   No results for input(s): LABPT, INR in the last 72 hours.  EXAM General - Patient is Alert, Appropriate and Oriented Extremity - Neurologically intact Neurovascular intact Sensation intact distally Intact pulses distally Dorsiflexion/Plantar flexion intact No cellulitis present Compartment soft Dressing - Wound VAC in place with no drainage Motor Function - intact, moving foot and toes well on exam.    Past Medical History:  Diagnosis Date  . Chronic kidney disease   . ED (erectile dysfunction)   . Erectile dysfunction   . GERD  (gastroesophageal reflux disease)   . High risk sexual behavior   . Hyperlipidemia   . Hypertension   . Tobacco use disorder, continuous     Assessment/Plan: 2 Days Post-Op Procedure(s) (LRB): TOTAL HIP ARTHROPLASTY ANTERIOR APPROACH (Right) Active Problems:   Status post total hip replacement, right  Estimated body mass index is 28.76 kg/m as calculated from the following:   Height as of this encounter: 6' (1.829 m).   Weight as of this encounter: 96.2 kg. Up with therapy Discharge home with home health  Labs: Hemoglobin 10.2 DVT Prophylaxis - Aspirin, Foot Pumps and TED hose Weight-Bearing as tolerated to right leg Please discontinue wound VAC and apply honeycomb. Please give the patient to honeycomb dressings to take home  Wille Glaser R. [place] 09/11/2017, 7:59 AM

## 2017-09-11 NOTE — Progress Notes (Addendum)
Physical Therapy Treatment Patient Details Name: Matthew Randolph MRN: [id number] DOB: 1940/07/31 Today's Date: 09/11/2017    History of Present Illness 77 y/o male s/p R total hip replacement (AVN, anterior approach) 9/5.      PT Comments    Participated in exercises as described below.  To edge of bed with rail and min assist. Was able to stand and ambulate up/down 2 steps with RW and verbal cues.  Overall did well but continues with irregular step pattern and heavy reliance on walker for support with gait.  Wife in for session and assisted/educated with stair training. No further questions or concerns regarding therapy/mobility.  [facility] in to deliver walker.  Adjusted for proper fit.   Follow Up Recommendations  Home health PT     Equipment Recommendations  3in1 (PT);Rolling walker with 5" wheels    Recommendations for Other Services       Precautions / Restrictions Precautions Precautions: Anterior Hip;Fall Restrictions Weight Bearing Restrictions: Yes RLE Weight Bearing: Weight bearing as tolerated    Mobility  Bed Mobility Overal bed mobility: Modified Independent             General bed mobility comments: used rail but no assist needed  Transfers Overall transfer level: Modified independent Equipment used: Rolling walker (2 wheeled)             General transfer comment: verbal cues for hand placements  Ambulation/Gait Ambulation/Gait assistance: Supervision;Min guard Gait Distance (Feet): 250 Feet Assistive device: Rolling walker (2 wheeled) Gait Pattern/deviations: Step-to pattern;Step-through pattern Gait velocity: decreased   General Gait Details: irregular pattern with verbal cues for walker position.  Progressed to step-through gait towards end of gait but uneven step length noted.   Stairs   Stairs assistance: Min guard Stair Management: No rails;Backwards;With walker Number of Stairs: 2 General stair comments: verbal cues  for sequencing   Wheelchair Mobility    Modified Rankin (Stroke Patients Only)       Balance Overall balance assessment: Modified Independent                                          Cognition Arousal/Alertness: Awake/alert Behavior During Therapy: WFL for tasks assessed/performed Overall Cognitive Status: Within Functional Limits for tasks assessed                                        Exercises Other Exercises Other Exercises: ankle pumps, quad sets and supine heel slided x 10 A/AAOM    General Comments        Pertinent Vitals/Pain Pain Assessment: 0-10 Pain Score: 4  Pain Location: at rest Pain Descriptors / Indicators: Sore;Operative site guarding Pain Intervention(s): Limited activity within patient's tolerance;Monitored during session;Repositioned    Home Living                      Prior Function            PT Goals (current goals can now be found in the care plan section) Progress towards PT goals: Progressing toward goals    Frequency           PT Plan Current plan remains appropriate    Co-evaluation              AM-PAC  PT "6 Clicks" Daily Activity  Outcome Measure  Difficulty turning over in bed (including adjusting bedclothes, sheets and blankets)?: None Difficulty moving from lying on back to sitting on the side of the bed? : None Difficulty sitting down on and standing up from a chair with arms (e.g., wheelchair, bedside commode, etc,.)?: A Little Help needed moving to and from a bed to chair (including a wheelchair)?: A Little Help needed walking in hospital room?: A Little Help needed climbing 3-5 steps with a railing? : A Little 6 Click Score: 20    End of Session Equipment Utilized During Treatment: Gait belt Activity Tolerance: Patient tolerated treatment well Patient left: in chair;with chair alarm set;with call bell/phone within reach;with family/visitor present          Time: 3845-3646 PT Time Calculation (min) (ACUTE ONLY): 24 min  Charges:  $Gait Training: 8-22 mins $Therapeutic Exercise: 8-22 mins                    Chesley Noon, PTA 09/11/17, 10:01 AM

## 2017-09-11 NOTE — Care Management Note (Signed)
Case Management Note  Patient Details  Name: WILDON CUEVAS MRN: [id number] Date of Birth: 11-03-1940  Subjective/Objective:    Patient to be discharged per MD order. Orders in place for home health services. Previous RNCM had placed referral with [facility] for PT services. Confirmed referral with Jermaine from [facility] and everything has been completed. DME orders for Rolling walker and bedside commode. Patient may be discharged once equipment arrives. No further needs. Family to provide transport.                  Action/Plan:   Expected Discharge Date:  09/11/17               Expected Discharge Plan:     In-House Referral:     Discharge planning Services  CM Consult  Post Acute Care Choice:  Home Health, Durable Medical Equipment Choice offered to:  Patient  DME Arranged:  Walker rolling, 3-N-1 DME Agency:  [place]:  PT [facility] Agency:     Status of Service:  In process, will continue to follow  If discussed at Long Length of Stay Meetings, dates discussed:    Additional Comments:  Latanya Maudlin, RN 09/11/2017, 8:47 AM

## 2017-09-12 DIAGNOSIS — E785 Hyperlipidemia, unspecified: Secondary | ICD-10-CM | POA: Diagnosis not present

## 2017-09-12 DIAGNOSIS — K219 Gastro-esophageal reflux disease without esophagitis: Secondary | ICD-10-CM | POA: Diagnosis not present

## 2017-09-12 DIAGNOSIS — Z471 Aftercare following joint replacement surgery: Secondary | ICD-10-CM | POA: Diagnosis not present

## 2017-09-12 DIAGNOSIS — I129 Hypertensive chronic kidney disease with stage 1 through stage 4 chronic kidney disease, or unspecified chronic kidney disease: Secondary | ICD-10-CM | POA: Diagnosis not present

## 2017-09-12 DIAGNOSIS — Z720 Tobacco use: Secondary | ICD-10-CM | POA: Diagnosis not present

## 2017-09-12 DIAGNOSIS — N189 Chronic kidney disease, unspecified: Secondary | ICD-10-CM | POA: Diagnosis not present

## 2017-09-12 DIAGNOSIS — Z96641 Presence of right artificial hip joint: Secondary | ICD-10-CM | POA: Diagnosis not present

## 2017-09-13 LAB — SURGICAL PATHOLOGY

## 2017-09-14 DIAGNOSIS — K219 Gastro-esophageal reflux disease without esophagitis: Secondary | ICD-10-CM | POA: Diagnosis not present

## 2017-09-14 DIAGNOSIS — Z96641 Presence of right artificial hip joint: Secondary | ICD-10-CM | POA: Diagnosis not present

## 2017-09-14 DIAGNOSIS — I129 Hypertensive chronic kidney disease with stage 1 through stage 4 chronic kidney disease, or unspecified chronic kidney disease: Secondary | ICD-10-CM | POA: Diagnosis not present

## 2017-09-14 DIAGNOSIS — Z720 Tobacco use: Secondary | ICD-10-CM | POA: Diagnosis not present

## 2017-09-14 DIAGNOSIS — N189 Chronic kidney disease, unspecified: Secondary | ICD-10-CM | POA: Diagnosis not present

## 2017-09-14 DIAGNOSIS — Z471 Aftercare following joint replacement surgery: Secondary | ICD-10-CM | POA: Diagnosis not present

## 2017-09-14 DIAGNOSIS — E785 Hyperlipidemia, unspecified: Secondary | ICD-10-CM | POA: Diagnosis not present

## 2017-09-16 DIAGNOSIS — Z471 Aftercare following joint replacement surgery: Secondary | ICD-10-CM | POA: Diagnosis not present

## 2017-09-16 DIAGNOSIS — E785 Hyperlipidemia, unspecified: Secondary | ICD-10-CM | POA: Diagnosis not present

## 2017-09-16 DIAGNOSIS — Z720 Tobacco use: Secondary | ICD-10-CM | POA: Diagnosis not present

## 2017-09-16 DIAGNOSIS — I129 Hypertensive chronic kidney disease with stage 1 through stage 4 chronic kidney disease, or unspecified chronic kidney disease: Secondary | ICD-10-CM | POA: Diagnosis not present

## 2017-09-16 DIAGNOSIS — Z96641 Presence of right artificial hip joint: Secondary | ICD-10-CM | POA: Diagnosis not present

## 2017-09-16 DIAGNOSIS — N189 Chronic kidney disease, unspecified: Secondary | ICD-10-CM | POA: Diagnosis not present

## 2017-09-16 DIAGNOSIS — K219 Gastro-esophageal reflux disease without esophagitis: Secondary | ICD-10-CM | POA: Diagnosis not present

## 2017-09-23 DIAGNOSIS — E785 Hyperlipidemia, unspecified: Secondary | ICD-10-CM | POA: Diagnosis not present

## 2017-09-23 DIAGNOSIS — Z96641 Presence of right artificial hip joint: Secondary | ICD-10-CM | POA: Diagnosis not present

## 2017-09-23 DIAGNOSIS — Z471 Aftercare following joint replacement surgery: Secondary | ICD-10-CM | POA: Diagnosis not present

## 2017-09-23 DIAGNOSIS — K219 Gastro-esophageal reflux disease without esophagitis: Secondary | ICD-10-CM | POA: Diagnosis not present

## 2017-09-23 DIAGNOSIS — Z720 Tobacco use: Secondary | ICD-10-CM | POA: Diagnosis not present

## 2017-09-23 DIAGNOSIS — I129 Hypertensive chronic kidney disease with stage 1 through stage 4 chronic kidney disease, or unspecified chronic kidney disease: Secondary | ICD-10-CM | POA: Diagnosis not present

## 2017-09-23 DIAGNOSIS — N189 Chronic kidney disease, unspecified: Secondary | ICD-10-CM | POA: Diagnosis not present

## 2017-09-27 ENCOUNTER — Other Ambulatory Visit: Payer: Self-pay | Admitting: Unknown Physician Specialty

## 2017-09-27 DIAGNOSIS — I129 Hypertensive chronic kidney disease with stage 1 through stage 4 chronic kidney disease, or unspecified chronic kidney disease: Secondary | ICD-10-CM | POA: Diagnosis not present

## 2017-09-27 DIAGNOSIS — Z471 Aftercare following joint replacement surgery: Secondary | ICD-10-CM | POA: Diagnosis not present

## 2017-09-27 DIAGNOSIS — E785 Hyperlipidemia, unspecified: Secondary | ICD-10-CM | POA: Diagnosis not present

## 2017-09-27 DIAGNOSIS — Z720 Tobacco use: Secondary | ICD-10-CM | POA: Diagnosis not present

## 2017-09-27 DIAGNOSIS — K219 Gastro-esophageal reflux disease without esophagitis: Secondary | ICD-10-CM | POA: Diagnosis not present

## 2017-09-27 DIAGNOSIS — Z96641 Presence of right artificial hip joint: Secondary | ICD-10-CM | POA: Diagnosis not present

## 2017-09-27 DIAGNOSIS — N189 Chronic kidney disease, unspecified: Secondary | ICD-10-CM | POA: Diagnosis not present

## 2017-09-27 NOTE — Telephone Encounter (Signed)
Lisinopril 5 mg refill Last Refill:03/22/17  # 90 and one refill Last OV: 08/20/17 PCP: Julian Hy (former pcp ) next appt with Odette Fraction, NP, on 02/21/2018 Pharmacy:[facility]

## 2017-09-30 DIAGNOSIS — Z471 Aftercare following joint replacement surgery: Secondary | ICD-10-CM | POA: Diagnosis not present

## 2017-10-04 DIAGNOSIS — Z471 Aftercare following joint replacement surgery: Secondary | ICD-10-CM | POA: Diagnosis not present

## 2017-10-04 DIAGNOSIS — I129 Hypertensive chronic kidney disease with stage 1 through stage 4 chronic kidney disease, or unspecified chronic kidney disease: Secondary | ICD-10-CM | POA: Diagnosis not present

## 2017-10-04 DIAGNOSIS — K219 Gastro-esophageal reflux disease without esophagitis: Secondary | ICD-10-CM | POA: Diagnosis not present

## 2017-10-04 DIAGNOSIS — Z720 Tobacco use: Secondary | ICD-10-CM | POA: Diagnosis not present

## 2017-10-04 DIAGNOSIS — E785 Hyperlipidemia, unspecified: Secondary | ICD-10-CM | POA: Diagnosis not present

## 2017-10-04 DIAGNOSIS — N189 Chronic kidney disease, unspecified: Secondary | ICD-10-CM | POA: Diagnosis not present

## 2017-10-04 DIAGNOSIS — Z96641 Presence of right artificial hip joint: Secondary | ICD-10-CM | POA: Diagnosis not present

## 2017-10-05 ENCOUNTER — Ambulatory Visit: Payer: Medicare HMO | Admitting: Urology

## 2017-10-05 ENCOUNTER — Encounter: Payer: Self-pay | Admitting: Urology

## 2017-10-06 DIAGNOSIS — K219 Gastro-esophageal reflux disease without esophagitis: Secondary | ICD-10-CM | POA: Diagnosis not present

## 2017-10-06 DIAGNOSIS — Z720 Tobacco use: Secondary | ICD-10-CM | POA: Diagnosis not present

## 2017-10-06 DIAGNOSIS — N189 Chronic kidney disease, unspecified: Secondary | ICD-10-CM | POA: Diagnosis not present

## 2017-10-06 DIAGNOSIS — I129 Hypertensive chronic kidney disease with stage 1 through stage 4 chronic kidney disease, or unspecified chronic kidney disease: Secondary | ICD-10-CM | POA: Diagnosis not present

## 2017-10-06 DIAGNOSIS — E785 Hyperlipidemia, unspecified: Secondary | ICD-10-CM | POA: Diagnosis not present

## 2017-10-06 DIAGNOSIS — Z471 Aftercare following joint replacement surgery: Secondary | ICD-10-CM | POA: Diagnosis not present

## 2017-10-06 DIAGNOSIS — Z96641 Presence of right artificial hip joint: Secondary | ICD-10-CM | POA: Diagnosis not present

## 2017-10-12 DIAGNOSIS — E785 Hyperlipidemia, unspecified: Secondary | ICD-10-CM | POA: Diagnosis not present

## 2017-10-12 DIAGNOSIS — N189 Chronic kidney disease, unspecified: Secondary | ICD-10-CM | POA: Diagnosis not present

## 2017-10-12 DIAGNOSIS — Z96641 Presence of right artificial hip joint: Secondary | ICD-10-CM | POA: Diagnosis not present

## 2017-10-12 DIAGNOSIS — I129 Hypertensive chronic kidney disease with stage 1 through stage 4 chronic kidney disease, or unspecified chronic kidney disease: Secondary | ICD-10-CM | POA: Diagnosis not present

## 2017-10-12 DIAGNOSIS — K219 Gastro-esophageal reflux disease without esophagitis: Secondary | ICD-10-CM | POA: Diagnosis not present

## 2017-10-12 DIAGNOSIS — Z471 Aftercare following joint replacement surgery: Secondary | ICD-10-CM | POA: Diagnosis not present

## 2017-10-12 DIAGNOSIS — Z720 Tobacco use: Secondary | ICD-10-CM | POA: Diagnosis not present

## 2017-10-19 DIAGNOSIS — R69 Illness, unspecified: Secondary | ICD-10-CM | POA: Diagnosis not present

## 2017-10-20 ENCOUNTER — Other Ambulatory Visit: Payer: Self-pay | Admitting: Nurse Practitioner

## 2017-10-20 DIAGNOSIS — Z96641 Presence of right artificial hip joint: Secondary | ICD-10-CM | POA: Diagnosis not present

## 2017-10-20 DIAGNOSIS — E785 Hyperlipidemia, unspecified: Secondary | ICD-10-CM | POA: Diagnosis not present

## 2017-10-20 DIAGNOSIS — I129 Hypertensive chronic kidney disease with stage 1 through stage 4 chronic kidney disease, or unspecified chronic kidney disease: Secondary | ICD-10-CM | POA: Diagnosis not present

## 2017-10-20 DIAGNOSIS — K219 Gastro-esophageal reflux disease without esophagitis: Secondary | ICD-10-CM | POA: Diagnosis not present

## 2017-10-20 DIAGNOSIS — N189 Chronic kidney disease, unspecified: Secondary | ICD-10-CM | POA: Diagnosis not present

## 2017-10-20 DIAGNOSIS — Z471 Aftercare following joint replacement surgery: Secondary | ICD-10-CM | POA: Diagnosis not present

## 2017-10-20 DIAGNOSIS — Z720 Tobacco use: Secondary | ICD-10-CM | POA: Diagnosis not present

## 2017-10-20 MED ORDER — ATORVASTATIN CALCIUM 10 MG PO TABS: 10.0000 mg | ORAL_TABLET | Freq: Every day | ORAL | 3 refills | Status: DC

## 2017-10-20 NOTE — Progress Notes (Signed)
Patient request refill on Atorvastatin.  [facility] per patient request.

## 2017-10-22 DIAGNOSIS — G8929 Other chronic pain: Secondary | ICD-10-CM | POA: Diagnosis not present

## 2017-10-22 DIAGNOSIS — M87051 Idiopathic aseptic necrosis of right femur: Secondary | ICD-10-CM | POA: Diagnosis not present

## 2017-10-22 DIAGNOSIS — M25551 Pain in right hip: Secondary | ICD-10-CM | POA: Diagnosis not present

## 2017-12-23 ENCOUNTER — Encounter: Payer: Self-pay | Admitting: Family Medicine

## 2017-12-23 ENCOUNTER — Ambulatory Visit (INDEPENDENT_AMBULATORY_CARE_PROVIDER_SITE_OTHER): Payer: Medicare HMO | Admitting: Family Medicine

## 2017-12-23 ENCOUNTER — Other Ambulatory Visit: Payer: Self-pay

## 2017-12-23 VITALS — BP 138/76 | HR 57 | Temp 98.3°F | Ht 70.0 in | Wt 209.0 lb

## 2017-12-23 DIAGNOSIS — J069 Acute upper respiratory infection, unspecified: Secondary | ICD-10-CM | POA: Diagnosis not present

## 2017-12-23 MED ORDER — BENZONATATE 200 MG PO CAPS: 200.0000 mg | ORAL_CAPSULE | Freq: Three times a day (TID) | ORAL | 0 refills | Status: DC | PRN

## 2017-12-23 MED ORDER — AZITHROMYCIN 250 MG PO TABS: ORAL_TABLET | ORAL | 0 refills | Status: DC

## 2017-12-23 MED ORDER — GUAIFENESIN-CODEINE 100-10 MG/5ML PO SOLN: 5.0000 mL | Freq: Three times a day (TID) | ORAL | 0 refills | Status: DC | PRN

## 2017-12-23 NOTE — Patient Instructions (Signed)
Antibiotic - azithromycin - take 2 tabs day one, then 1 tablet daily until they're finished  Cough medicines - take benzonatate capsules up to three times daily as needed for daytime cough, and take robitussin AC syrup at bedtime as needed (makes you sleepy). Can take delsym during the day as needed.   Take 600 mg guafenesin (mucinex) twice daily for several weeks to thin out your mucus  Take your symbicort inhaler twice daily until feeling better - rinse your mouth with water after use  Use a humidifier, drink lots of water, get lots of rest

## 2017-12-23 NOTE — Progress Notes (Signed)
BP 138/76   Pulse (!) 57   Temp 98.3 F (36.8 C) (Oral)   Ht 5\' 10"  (1.778 m)   Wt 209 lb (94.8 kg)   SpO2 96%   BMI 29.99 kg/m    Subjective:    Patient ID: Matthew Randolph, male    DOB: 1940/01/25, 77 y.o.   MRN: [id number]  HPI: SI JACHIM is a 77 y.o. male  Chief Complaint  Patient presents with  . Cough    states has had a chest congestion for about a week/ tried OTC cold medication   . Nasal Congestion   Congestion, productive cough, sore throat, wheezing, SOB x 1 week. Some low grade fevers and aches initially. Taking dayquil, nyquil, robitussin with minimal relief. Wife sick with similar sxs. Denies CP, n/v/d. No hx of pulmonary dz but is a current smoker.   Relevant past medical, surgical, family and social history reviewed and updated as indicated. Interim medical history since our last visit reviewed. Allergies and medications reviewed and updated.  Review of Systems  Per HPI unless specifically indicated above     Objective:    BP 138/76   Pulse (!) 57   Temp 98.3 F (36.8 C) (Oral)   Ht 5\' 10"  (1.778 m)   Wt 209 lb (94.8 kg)   SpO2 96%   BMI 29.99 kg/m   Wt Readings from Last 3 Encounters:  12/23/17 209 lb (94.8 kg)  09/09/17 212 lb 1.3 oz (96.2 kg)  09/02/17 209 lb (94.8 kg)    Physical Exam Vitals signs and nursing note reviewed.  Constitutional:      Appearance: Normal appearance. He is well-developed.  HENT:     Head: Atraumatic.     Right Ear: External ear normal.     Left Ear: External ear normal.     Nose: Congestion present.     Mouth/Throat:     Pharynx: Posterior oropharyngeal erythema present. No oropharyngeal exudate.  Eyes:     Conjunctiva/sclera: Conjunctivae normal.     Pupils: Pupils are equal, round, and reactive to light.  Neck:     Musculoskeletal: Normal range of motion and neck supple.  Cardiovascular:     Rate and Rhythm: Normal rate and regular rhythm.  Pulmonary:     Effort: Pulmonary effort is normal. No  respiratory distress.     Breath sounds: Wheezing (minimal) present. No rales.  Musculoskeletal: Normal range of motion.  Lymphadenopathy:     Cervical: No cervical adenopathy.  Skin:    General: Skin is warm and dry.  Neurological:     Mental Status: He is alert and oriented to person, place, and time.  Psychiatric:        Behavior: Behavior normal.     Results for orders placed or performed during the hospital encounter of 09/09/17  Sedimentation rate  Result Value Ref Range   Sed Rate 52 (H) 0 - 20 mm/hr  CBC  Result Value Ref Range   WBC 7.1 3.8 - 10.6 K/uL   RBC 3.52 (L) 4.40 - 5.90 MIL/uL   Hemoglobin 10.1 (L) 13.0 - 18.0 g/dL   HCT 30.4 (L) 40.0 - 52.0 %   MCV 86.1 80.0 - 100.0 fL   MCH 28.7 26.0 - 34.0 pg   MCHC 33.3 32.0 - 36.0 g/dL   RDW 13.8 11.5 - 14.5 %   Platelets 235 150 - 440 K/uL  Basic metabolic panel  Result Value Ref Range   Sodium 136 135 -  145 mmol/L   Potassium 3.9 3.5 - 5.1 mmol/L   Chloride 106 98 - 111 mmol/L   CO2 25 22 - 32 mmol/L   Glucose, Bld 132 (H) 70 - 99 mg/dL   BUN 16 8 - 23 mg/dL   Creatinine, Ser 1.13 0.61 - 1.24 mg/dL   Calcium 8.4 (L) 8.9 - 10.3 mg/dL   GFR calc non Af Amer >60 >60 mL/min   GFR calc Af Amer >60 >60 mL/min   Anion gap 5 5 - 15  CBC  Result Value Ref Range   WBC 10.0 3.8 - 10.6 K/uL   RBC 3.54 (L) 4.40 - 5.90 MIL/uL   Hemoglobin 10.2 (L) 13.0 - 18.0 g/dL   HCT 30.7 (L) 40.0 - 52.0 %   MCV 86.6 80.0 - 100.0 fL   MCH 28.8 26.0 - 34.0 pg   MCHC 33.2 32.0 - 36.0 g/dL   RDW 13.5 11.5 - 14.5 %   Platelets 214 150 - 440 K/uL  Basic metabolic panel  Result Value Ref Range   Sodium 134 (L) 135 - 145 mmol/L   Potassium 3.8 3.5 - 5.1 mmol/L   Chloride 104 98 - 111 mmol/L   CO2 26 22 - 32 mmol/L   Glucose, Bld 107 (H) 70 - 99 mg/dL   BUN 20 8 - 23 mg/dL   Creatinine, Ser 1.60 (H) 0.61 - 1.24 mg/dL   Calcium 8.7 (L) 8.9 - 10.3 mg/dL   GFR calc non Af Amer 41 (L) >60 mL/min   GFR calc Af Amer 47 (L) >60 mL/min    Anion gap 4 (L) 5 - 15  ABO/Rh  Result Value Ref Range   ABO/RH(D)      O POS Performed at [facility], [address] [place], [place] [address]   Surgical pathology  Result Value Ref Range   SURGICAL PATHOLOGY      Surgical Pathology CASE: [id number] PATIENT: Sharon Seller Surgical Pathology Report     SPECIMEN SUBMITTED: A. Femoral head, right  CLINICAL HISTORY: None provided  PRE-OPERATIVE DIAGNOSIS: Avascular necrosis of bone right hip  POST-OPERATIVE DIAGNOSIS: None provided.     DIAGNOSIS: A. FEMORAL HEAD, RIGHT; TOTAL HIP ARTHROPLASTY: - AVASCULAR NECROSIS WITH MARKED CARTILAGE DEGENERATION.   GROSS DESCRIPTION: A. Labeled: Right femoral head Received: In formalin Size of specimen:      Head - 5.3 x 5.1 cm      Neck - 4.5 x 3.8 cm Articular surface: Yellow-tan, disrupted and focally sloughing Cut surface: Predominantly bright yellow Other findings: None noted  Block summary: 1-2 - representative sections  Tissue decalcification: yes   Final Diagnosis performed by Bryan Lemma, MD.   Electronically signed 09/13/2017 7:28:29PM The electronic signature indicates that the named Attending Pathologist has evaluated the specimen  Technical component pe rformed at [facility], [address], [place], [place] [address] Lab: [phone] Dir: Rush Farmer, MD, MMM  Professional component performed at [facility], [facility], [place], [place], [place] [address] Lab: [phone] Dir: Dellia Nims. Reuel Derby, MD       Assessment & Plan:   Problem List Items Addressed This Visit    None    Visit Diagnoses    Upper respiratory tract infection, unspecified type    -  Primary   Tx with zpak, tessalon, robitussin AC, plain mucinex, symbicort sample. Supportive care and sedation precautions reviewed. F/u if not improving.    Relevant Medications   azithromycin (ZITHROMAX) 250 MG tablet  Follow up plan: Return if symptoms  worsen or fail to improve.

## 2018-02-21 ENCOUNTER — Ambulatory Visit: Payer: Medicare HMO | Admitting: Nurse Practitioner

## 2018-03-10 ENCOUNTER — Other Ambulatory Visit: Payer: Self-pay | Admitting: Physician Assistant

## 2018-03-29 ENCOUNTER — Other Ambulatory Visit: Payer: Self-pay | Admitting: Family Medicine

## 2018-03-30 MED ORDER — LISINOPRIL 5 MG PO TABS: 5.0000 mg | ORAL_TABLET | Freq: Every day | ORAL | 0 refills | Status: DC

## 2018-03-30 NOTE — Telephone Encounter (Signed)
Routing to close.  °

## 2018-03-30 NOTE — Telephone Encounter (Signed)
Overdue for 6 month f/u, please see if he can do a video f/u

## 2018-03-30 NOTE — Telephone Encounter (Signed)
Called pt to see if we could set up virtual visit pt states he will have to wait until regina gets in to set up an appointment

## 2018-04-03 ENCOUNTER — Other Ambulatory Visit: Payer: Self-pay | Admitting: Family Medicine

## 2018-04-17 ENCOUNTER — Encounter: Payer: Self-pay | Admitting: Family Medicine

## 2018-04-18 ENCOUNTER — Encounter: Payer: Self-pay | Admitting: Family Medicine

## 2018-04-18 NOTE — Telephone Encounter (Signed)
Please remind pt he is several months overdue for his follow up and was told last month he needed to schedule this virtually for more refills and courtesy supply was given to bridge at that time. If he does get scheduled will bridge him once more.

## 2018-04-20 ENCOUNTER — Encounter: Payer: Self-pay | Admitting: Family Medicine

## 2018-04-20 ENCOUNTER — Ambulatory Visit (INDEPENDENT_AMBULATORY_CARE_PROVIDER_SITE_OTHER): Payer: Medicare HMO | Admitting: Family Medicine

## 2018-04-20 ENCOUNTER — Other Ambulatory Visit: Payer: Self-pay

## 2018-04-20 VITALS — BP 140/69 | HR 61 | Temp 97.9°F | Ht 70.0 in | Wt 215.0 lb

## 2018-04-20 DIAGNOSIS — K219 Gastro-esophageal reflux disease without esophagitis: Secondary | ICD-10-CM

## 2018-04-20 DIAGNOSIS — I10 Essential (primary) hypertension: Secondary | ICD-10-CM

## 2018-04-20 DIAGNOSIS — Z113 Encounter for screening for infections with a predominantly sexual mode of transmission: Secondary | ICD-10-CM | POA: Diagnosis not present

## 2018-04-20 DIAGNOSIS — E782 Mixed hyperlipidemia: Secondary | ICD-10-CM | POA: Diagnosis not present

## 2018-04-20 DIAGNOSIS — R972 Elevated prostate specific antigen [PSA]: Secondary | ICD-10-CM | POA: Diagnosis not present

## 2018-04-20 DIAGNOSIS — R69 Illness, unspecified: Secondary | ICD-10-CM | POA: Diagnosis not present

## 2018-04-20 DIAGNOSIS — D352 Benign neoplasm of pituitary gland: Secondary | ICD-10-CM | POA: Insufficient documentation

## 2018-04-20 LAB — UA/M W/RFLX CULTURE, ROUTINE
Bilirubin, UA: NEGATIVE
Glucose, UA: NEGATIVE
Ketones, UA: NEGATIVE
Leukocytes,UA: NEGATIVE
Nitrite, UA: NEGATIVE
Protein,UA: NEGATIVE
RBC, UA: NEGATIVE
Specific Gravity, UA: 1.025 (ref 1.005–1.030)
Urobilinogen, Ur: 0.2 mg/dL (ref 0.2–1.0)
pH, UA: 5.5 (ref 5.0–7.5)

## 2018-04-20 MED ORDER — MELOXICAM 15 MG PO TABS: 15.0000 mg | ORAL_TABLET | ORAL | 1 refills | Status: DC | PRN

## 2018-04-20 MED ORDER — ATORVASTATIN CALCIUM 10 MG PO TABS: 10.0000 mg | ORAL_TABLET | Freq: Every day | ORAL | 0 refills | Status: DC

## 2018-04-20 MED ORDER — MELOXICAM 15 MG PO TABS: 15.0000 mg | ORAL_TABLET | ORAL | 0 refills | Status: DC | PRN

## 2018-04-20 MED ORDER — LISINOPRIL 5 MG PO TABS: 5.0000 mg | ORAL_TABLET | Freq: Every day | ORAL | 0 refills | Status: DC

## 2018-04-20 MED ORDER — ATORVASTATIN CALCIUM 10 MG PO TABS: 10.0000 mg | ORAL_TABLET | Freq: Every day | ORAL | 1 refills | Status: DC

## 2018-04-20 MED ORDER — LISINOPRIL 5 MG PO TABS: 5.0000 mg | ORAL_TABLET | Freq: Every day | ORAL | 1 refills | Status: DC

## 2018-04-20 NOTE — Progress Notes (Signed)
BP 140/69   Pulse 61   Temp 97.9 F (36.6 C) (Oral)   Ht 5\' 10"  (1.778 m)   Wt 215 lb (97.5 kg)   BMI 30.85 kg/m    Subjective:    Patient ID: Matthew Randolph, male    DOB: 21-Aug-1940, 78 y.o.   MRN: [id number]  HPI: HARVEST STANCO is a 78 y.o. male  Chief Complaint  Patient presents with  . Medication Management    . This visit was completed via telephone due to the restrictions of the COVID-19 pandemic. All issues as above were discussed and addressed but no physical exam was performed. If it was felt that the patient should be evaluated in the office, they were directed there. The patient verbally consented to this visit. Patient was unable to complete an audio/visual visit due to Technical difficulties,Lack of internet. Due to the catastrophic nature of the COVID-19 pandemic, this visit was done through audio contact only. . Location of the patient: home . Location of the provider: home . Those involved with this call:  . Provider: Merrie Roof, PA-C . CMA: Gerda Diss, CMA . Front Desk/Registration: Jill Side  . Time spent on call: 25 minutes with patient face to face via video conference. More than 50% of this time was spent in counseling and coordination of care. 8 minutes total spent in review of patient's record and preparation of their chart.  Pt presenting for 6 month f/u chronic conditions. No new concerns today, states things are going very well. Taking all medicines faithfully without side effects. Denies Cp, SOB, dizziness, claudication, myalgias, urinary sxs.   Taking lisinopril for BPs. Home readings typically stable under 140/90 when checked. Tolerating atorvastatin well for chol management. Trying to to stay active and eat well. Protonix controlling reflux sxs well. Hx of elevated PSA, no new urinary issues.   Pt requesting full STI panel today, no sxs or known exposures but just wanting screening.   Relevant past medical, surgical, family and social  history reviewed and updated as indicated. Interim medical history since our last visit reviewed. Allergies and medications reviewed and updated.  Review of Systems  Per HPI unless specifically indicated above     Objective:    BP 140/69   Pulse 61   Temp 97.9 F (36.6 C) (Oral)   Ht 5\' 10"  (1.778 m)   Wt 215 lb (97.5 kg)   BMI 30.85 kg/m   Wt Readings from Last 3 Encounters:  04/20/18 215 lb (97.5 kg)  12/23/17 209 lb (94.8 kg)  09/09/17 212 lb 1.3 oz (96.2 kg)    Physical Exam Unable to perform PE as could not connect with pt's video technology.   Results for orders placed or performed in visit on 04/20/18  GC/Chlamydia Probe Amp  Result Value Ref Range   Chlamydia trachomatis, NAA Negative Negative   Neisseria Gonorrhoeae by PCR Negative Negative  PSA  Result Value Ref Range   Prostate Specific Ag, Serum 3.5 0.0 - 4.0 ng/mL  UA/M w/rflx Culture, Routine  Result Value Ref Range   Specific Gravity, UA 1.025 1.005 - 1.030   pH, UA 5.5 5.0 - 7.5   Color, UA Yellow Yellow   Appearance Ur Clear Clear   Leukocytes,UA Negative Negative   Protein,UA Negative Negative/Trace   Glucose, UA Negative Negative   Ketones, UA Negative Negative   RBC, UA Negative Negative   Bilirubin, UA Negative Negative   Urobilinogen, Ur 0.2 0.2 - 1.0 mg/dL  Nitrite, UA Negative Negative  RPR  Result Value Ref Range   RPR Ser Ql Non Reactive Non Reactive  HSV(herpes simplex vrs) 1+2 ab-IgG  Result Value Ref Range   HSV 1 Glycoprotein G Ab, IgG 36.00 (H) 0.00 - 0.90 index   HSV 2 IgG, Type Spec 4.04 (H) 0.00 - 0.90 index  Lipid Panel w/o Chol/HDL Ratio  Result Value Ref Range   Cholesterol, Total 164 100 - 199 mg/dL   Triglycerides 85 0 - 149 mg/dL   HDL 68 >39 mg/dL   VLDL Cholesterol Cal 17 5 - 40 mg/dL   LDL Calculated 79 0 - 99 mg/dL  HIV Antibody (routine testing w rflx)  Result Value Ref Range   HIV Screen 4th Generation wRfx Non Reactive Non Reactive  Comprehensive  metabolic panel  Result Value Ref Range   Glucose 83 65 - 99 mg/dL   BUN 11 8 - 27 mg/dL   Creatinine, Ser 1.19 0.76 - 1.27 mg/dL   GFR calc non Af Amer 59 (L) >59 mL/min/1.73   GFR calc Af Amer 69 >59 mL/min/1.73   BUN/Creatinine Ratio 9 (L) 10 - 24   Sodium 140 134 - 144 mmol/L   Potassium 4.9 3.5 - 5.2 mmol/L   Chloride 102 96 - 106 mmol/L   CO2 22 20 - 29 mmol/L   Calcium 9.9 8.6 - 10.2 mg/dL   Total Protein 7.1 6.0 - 8.5 g/dL   Albumin 4.3 3.7 - 4.7 g/dL   Globulin, Total 2.8 1.5 - 4.5 g/dL   Albumin/Globulin Ratio 1.5 1.2 - 2.2   Bilirubin Total 0.6 0.0 - 1.2 mg/dL   Alkaline Phosphatase 58 39 - 117 IU/L   AST 24 0 - 40 IU/L   ALT 20 0 - 44 IU/L  HSV-2 IgG Supplemental Test  Result Value Ref Range   HSV-2 IgG Supplemental Test Positive (A) Negative      Assessment & Plan:   Problem List Items Addressed This Visit      Cardiovascular and Mediastinum   Benign essential HTN - Primary    Stable and WNL, continue current regimen and good lifestyle habits      Relevant Medications   lisinopril (PRINIVIL,ZESTRIL) 5 MG tablet   atorvastatin (LIPITOR) 10 MG tablet   Other Relevant Orders   Comprehensive metabolic panel (Completed)     Digestive   GERD (gastroesophageal reflux disease)    Stable and under good control, continue protonix regimen        Other   Hyperlipemia    Recheck lipids, continue lifestyle modifications. Adjust medications as needed. Continue current regimen       Relevant Medications   lisinopril (PRINIVIL,ZESTRIL) 5 MG tablet   atorvastatin (LIPITOR) 10 MG tablet   Other Relevant Orders   Lipid Panel w/o Chol/HDL Ratio (Completed)   Elevated PSA    Mildly elevated above his baseline 6 months ago, will recheck to ensure not trending upward and leveling back out. Continue to monitor for new sxs      Relevant Orders   PSA (Completed)    Other Visit Diagnoses    Routine screening for STI (sexually transmitted infection)       Relevant  Orders   HIV Antibody (routine testing w rflx) (Completed)   HSV(herpes simplex vrs) 1+2 ab-IgG (Completed)   RPR (Completed)   GC/Chlamydia Probe Amp (Completed)   UA/M w/rflx Culture, Routine (Completed)       Follow up plan: Return in about 6 months (  around 10/20/2018) for CPE.

## 2018-04-21 LAB — LIPID PANEL W/O CHOL/HDL RATIO
Cholesterol, Total: 164 mg/dL (ref 100–199)
HDL: 68 mg/dL (ref >39)
LDL Calculated: 79 mg/dL (ref 0–99)
Triglycerides: 85 mg/dL (ref 0–149)
VLDL Cholesterol Cal: 17 mg/dL (ref 5–40)

## 2018-04-21 LAB — COMPREHENSIVE METABOLIC PANEL
ALT: 20 IU/L (ref 0–44)
AST: 24 IU/L (ref 0–40)
Albumin/Globulin Ratio: 1.5 (ref 1.2–2.2)
Albumin: 4.3 g/dL (ref 3.7–4.7)
Alkaline Phosphatase: 58 IU/L (ref 39–117)
BUN/Creatinine Ratio: 9 — ABNORMAL LOW (ref 10–24)
BUN: 11 mg/dL (ref 8–27)
Bilirubin Total: 0.6 mg/dL (ref 0.0–1.2)
CO2: 22 mmol/L (ref 20–29)
Calcium: 9.9 mg/dL (ref 8.6–10.2)
Chloride: 102 mmol/L (ref 96–106)
Creatinine, Ser: 1.19 mg/dL (ref 0.76–1.27)
GFR calc Af Amer: 69 mL/min/{1.73_m2} (ref >59)
GFR calc non Af Amer: 59 mL/min/{1.73_m2} — ABNORMAL LOW (ref >59)
Globulin, Total: 2.8 g/dL (ref 1.5–4.5)
Glucose: 83 mg/dL (ref 65–99)
Potassium: 4.9 mmol/L (ref 3.5–5.2)
Sodium: 140 mmol/L (ref 134–144)
Total Protein: 7.1 g/dL (ref 6.0–8.5)

## 2018-04-21 LAB — HIV ANTIBODY (ROUTINE TESTING W REFLEX): HIV Screen 4th Generation wRfx: NONREACTIVE

## 2018-04-21 LAB — HSV(HERPES SIMPLEX VRS) I + II AB-IGG
HSV 1 Glycoprotein G Ab, IgG: 36 index — ABNORMAL HIGH (ref 0.00–0.90)
HSV 2 IgG, Type Spec: 4.04 index — ABNORMAL HIGH (ref 0.00–0.90)

## 2018-04-21 LAB — RPR: RPR Ser Ql: NONREACTIVE

## 2018-04-21 LAB — PSA: Prostate Specific Ag, Serum: 3.5 ng/mL (ref 0.0–4.0)

## 2018-04-21 LAB — HSV-2 IGG SUPPLEMENTAL TEST: HSV-2 IgG Supplemental Test: POSITIVE — AB

## 2018-04-22 ENCOUNTER — Other Ambulatory Visit: Payer: Self-pay

## 2018-04-22 NOTE — Telephone Encounter (Signed)
Already sent the other day

## 2018-04-22 NOTE — Telephone Encounter (Signed)
[facility] mail service faxed a RX refill request for atorvastatin 10 mg tab

## 2018-04-23 LAB — GC/CHLAMYDIA PROBE AMP
Chlamydia trachomatis, NAA: NEGATIVE
Neisseria Gonorrhoeae by PCR: NEGATIVE

## 2018-04-25 ENCOUNTER — Encounter: Payer: Self-pay | Admitting: Family Medicine

## 2018-04-25 NOTE — Assessment & Plan Note (Signed)
Mildly elevated above his baseline 6 months ago, will recheck to ensure not trending upward and leveling back out. Continue to monitor for new sxs

## 2018-04-25 NOTE — Assessment & Plan Note (Signed)
Stable and WNL, continue current regimen and good lifestyle habits

## 2018-04-25 NOTE — Assessment & Plan Note (Signed)
Recheck lipids, continue lifestyle modifications. Adjust medications as needed. Continue current regimen

## 2018-04-25 NOTE — Assessment & Plan Note (Signed)
Stable and under good control, continue protonix regimen

## 2018-08-18 ENCOUNTER — Ambulatory Visit (INDEPENDENT_AMBULATORY_CARE_PROVIDER_SITE_OTHER): Payer: Medicare HMO

## 2018-08-18 VITALS — BP 149/76 | Temp 96.6°F | Ht 72.0 in | Wt 212.0 lb

## 2018-08-18 DIAGNOSIS — Z0000 Encounter for general adult medical examination without abnormal findings: Secondary | ICD-10-CM | POA: Diagnosis not present

## 2018-08-18 NOTE — Progress Notes (Signed)
Subjective:   Matthew Randolph is a 78 y.o. male who presents for Medicare Annual/Subsequent preventive examination.  This visit is being conducted via phone call  - after an attmept to do on video chat - due to the COVID-19 pandemic. This patient has given me verbal consent via phone to conduct this visit, patient states they are participating from their home address. Some vital signs may be absent or patient reported.   Patient identification: identified by name, DOB, and current address.    Review of Systems:   Cardiac Risk Factors include: advanced age (>41men, >70 women);hypertension;male gender;dyslipidemia     Objective:    Vitals: BP (!) 153/68 Comment: pt reported  Temp (!) 96.6 F (35.9 C) Comment: pt reported  Ht 6' (1.829 m)   Wt 212 lb (96.2 kg) Comment: pt reported  BMI 28.75 kg/m   Body mass index is 28.75 kg/m.  Advanced Directives 08/18/2018 09/09/2017 09/02/2017 08/05/2017 07/29/2016  Does Patient Have a Medical Advance Directive? No No No No Yes  Type of Advance Directive - - - - Press photographer;Living will  Copy of [place] in Chart? - - - - No - copy requested  Would patient like information on creating a medical advance directive? - No - Patient declined - Yes (MAU/Ambulatory/Procedural Areas - Information given) -    Tobacco Social History   Tobacco Use  Smoking Status Current Every Day Smoker  . Packs/day: 0.25  . Types: Cigarettes  Smokeless Tobacco Never Used  Tobacco Comment   pck last 3-4 days      Ready to quit: Yes Counseling given: No Comment: pck last 3-4 days    Clinical Intake:  Pre-visit preparation completed: Yes  Pain : No/denies pain     Nutritional Status: BMI 25 -29 Overweight Nutritional Risks: None Diabetes: No  How often do you need to have someone help you when you read instructions, pamphlets, or other written materials from your doctor or pharmacy?: 1 - Never  Interpreter Needed?: No   Information entered by :: Tiffany Hill,LPN  Past Medical History:  Diagnosis Date  . Chronic kidney disease   . ED (erectile dysfunction)   . Erectile dysfunction   . GERD (gastroesophageal reflux disease)   . High risk sexual behavior   . Hyperlipidemia   . Hypertension   . Tobacco use disorder, continuous    Past Surgical History:  Procedure Laterality Date  . PITUITARY SURGERY  12/10/2015  . TOTAL HIP ARTHROPLASTY Right 09/09/2017   Procedure: TOTAL HIP ARTHROPLASTY ANTERIOR APPROACH;  Surgeon: Hessie Knows, MD;  Location: [facility] ORS;  Service: Orthopedics;  Laterality: Right;   Family History  Problem Relation Age of Onset  . Brain cancer Mother   . Cancer Mother        Brain tumor  . Thyroid disease Mother   . Lung disease Mother        From [place]  . Heart disease Father   . Stroke Father   . Hypertension Brother   . Aneurysm Brother   . Pneumonia Brother   . Heart disease Sister        massive MI  . Obesity Sister    Social History   Socioeconomic History  . Marital status: Divorced    Spouse name: Not on file  . Number of children: 4  . Years of education: 12th  . Highest education level: High school graduate  Occupational History  . Occupation: Retired [employer]  Social Needs  . Financial resource strain: Not hard at all  . Food insecurity    Worry: Never true    Inability: Never true  . Transportation needs    Medical: No    Non-medical: No  Tobacco Use  . Smoking status: Current Every Day Smoker    Packs/day: 0.25    Types: Cigarettes  . Smokeless tobacco: Never Used  . Tobacco comment: pck last 3-4 days   Substance and Sexual Activity  . Alcohol use: Yes    Alcohol/week: 1.0 standard drinks    Types: 1 Cans of beer per week  . Drug use: No  . Sexual activity: Yes    Partners: Female  Lifestyle  . Physical activity    Days per week: 0 days    Minutes per session: 0 min  . Stress: Not at all  Relationships  . Social connections     Talks on phone: More than three times a week    Gets together: More than three times a week    Attends religious service: Never    Active member of club or organization: No    Attends meetings of clubs or organizations: Never    Relationship status: Living with partner  Other Topics Concern  . Not on file  Social History Narrative   ** Merged History Encounter **        Outpatient Encounter Medications as of 08/18/2018  Medication Sig  . aspirin EC 325 MG EC tablet Take 1 tablet (325 mg total) by mouth daily with breakfast.  . atorvastatin (LIPITOR) 10 MG tablet Take 1 tablet (10 mg total) by mouth daily.  Marland Kitchen lisinopril (PRINIVIL,ZESTRIL) 5 MG tablet Take 1 tablet (5 mg total) by mouth daily.  . meloxicam (MOBIC) 15 MG tablet Take 1 tablet (15 mg total) by mouth as needed for pain.  . Multiple Vitamin (MULTIVITAMIN) tablet Take 1 tablet by mouth daily.  . pantoprazole (PROTONIX) 40 MG tablet Take 1 tablet (40 mg total) by mouth daily as needed.   No facility-administered encounter medications on file as of 08/18/2018.     Activities of Daily Living In your present state of health, do you have any difficulty performing the following activities: 08/18/2018 09/09/2017  Hearing? N N  Comment no hearings aids -  Vision? Y N  Comment reading glasses. [email or web address]. -  Difficulty concentrating or making decisions? N N  Walking or climbing stairs? Y Y  Comment hip pain -  Dressing or bathing? N N  Doing errands, shopping? N N  Preparing Food and eating ? N -  Using the Toilet? N -  In the past six months, have you accidently leaked urine? N -  Do you have problems with loss of bowel control? N -  Managing your Medications? N -  Managing your Finances? N -  Housekeeping or managing your Housekeeping? N -  Some recent data might be hidden    Patient Care Team: Volney American, PA-C as PCP - General (Family Medicine) Derrill Memo, MD as Referring Physician (Neurosurgery)    Assessment:   This is a routine wellness examination for Antoin.  Exercise Activities and Dietary recommendations Current Exercise Habits: The patient does not participate in regular exercise at present, Exercise limited by: orthopedic condition(s)  Goals    . Quit Smoking     Smoking cessation discussed    . Quit smoking / using tobacco     Smoking cessation discussed  Fall Risk: Fall Risk  08/18/2018 04/20/2018 08/05/2017 07/29/2016 02/14/2016  Falls in the past year? 0 0 No No No  Follow up - Falls evaluation completed - - -    FALL RISK PREVENTION PERTAINING TO THE HOME:  Any stairs in or around the home? No 2 steps  If so, are there any without handrails? No   Home free of loose throw rugs in walkways, pet beds, electrical cords, etc? Yes  Adequate lighting in your home to reduce risk of falls? Yes   ASSISTIVE DEVICES UTILIZED TO PREVENT FALLS:  Life alert? No  Use of a cane, walker or w/c? Yes  cane, walker as needed Grab bars in the bathroom? Yes  Shower chair or bench in shower? No  Elevated toilet seat or a handicapped toilet? Yes   TIMED UP AND GO:  unable to perform  Depression Screen PHQ 2/9 Scores 08/18/2018 08/20/2017 08/05/2017 07/29/2016  PHQ - 2 Score 0 0 0 0  PHQ- 9 Score - 0 - -    Cognitive Function     6CIT Screen 08/05/2017 07/29/2016  What Year? 0 points 0 points  What month? 0 points 0 points  What time? 0 points 0 points  Count back from 20 0 points 0 points  Months in reverse 0 points 0 points  Repeat phrase 2 points 6 points  Total Score 2 6    Immunization History  Administered Date(s) Administered  . Influenza, High Dose Seasonal PF 10/26/2016, 10/19/2017  . Influenza-Unspecified 10/15/2015  . Pneumococcal Conjugate-13 12/13/2013  . Pneumococcal Polysaccharide-23 04/10/2008  . Pneumococcal-Unspecified 04/10/2008  . Td 10/05/2005, 10/05/2005, 08/19/2016  . Zoster 04/10/2008    Qualifies for Shingles Vaccine? Yes  Zostavax  completed 04/10/2008. Due for Shingrix. Education has been provided regarding the importance of this vaccine. Pt has been advised to call insurance company to determine out of pocket expense. Advised may also receive vaccine at local pharmacy or Health Dept. Verbalized acceptance and understanding.   Tdap: up to date  Flu Vaccine: Due 09/2018  Pneumococcal Vaccine: up to date   Screening Tests Health Maintenance  Topic Date Due  . INFLUENZA VACCINE  08/06/2018  . COLONOSCOPY  11/16/2021  . TETANUS/TDAP  08/20/2026  . PNA vac Low Risk Adult  Completed   Cancer Screenings:  Colorectal Screening: no longer required  Lung Cancer Screening: (Low Dose CT Chest recommended if Age 100-80 years, 30 pack-year currently smoking OR have quit w/in 15years.) does not qualify.    Additional Screening:  Hepatitis C Screening: does not qualify  Vision Screening: Recommended annual ophthalmology exams for early detection of glaucoma and other disorders of the eye. Is the patient up to date with their annual eye exam?  Yes  Who is the provider or what is the name of the office in which the pt attends annual eye exams? Dr.Dingledin    Dental Screening: Recommended annual dental exams for proper oral hygiene  Community Resource Referral:  CRR required this visit?  No        Plan:  I have personally reviewed and addressed the Medicare Annual Wellness questionnaire and have noted the following in the patient's chart:  A. Medical and social history B. Use of alcohol, tobacco or illicit drugs  C. Current medications and supplements D. Functional ability and status E.  Nutritional status F.  Physical activity G. Advance directives H. List of other physicians I.  Hospitalizations, surgeries, and ER visits in previous 12 months J.  Vitals K.  Screenings such as hearing and vision if needed, cognitive and depression L. Referrals and appointments   In addition, I have reviewed and discussed with  patient certain preventive protocols, quality metrics, and best practice recommendations. A written personalized care plan for preventive services as well as general preventive health recommendations were provided to patient.   Signed,   Bevelyn Ngo, LPN  02/25/2540 Nurse Health Advisor   Nurse Notes: needs refill on meloxicam

## 2018-08-18 NOTE — Patient Instructions (Signed)
Matthew Randolph , Thank you for taking time to come for your Medicare Wellness Visit. I appreciate your ongoing commitment to your health goals. Please review the following plan we discussed and let me know if I can assist you in the future.   Screening recommendations/referrals: Colonoscopy: no longer required Recommended yearly ophthalmology/optometry visit for glaucoma screening and checkup Recommended yearly dental visit for hygiene and checkup  Vaccinations: Influenza vaccine: due 09/2018 Pneumococcal vaccine: up to date Tdap vaccine: up to date Shingles vaccine: shingrix eligible, check with your insurance for coverage    Advanced directives:  Please pick up a copy of this information next time your in the office   Conditions/risks identified: If you wish to quit smoking, help is available. For free tobacco cessation program offerings call the [facility] at (212)741-2772 or Live Well Line at (914) 420-7660. You may also visit [email or web address] or email [email or web address] for more information on other programs.   Next appointment: Follow up in one year for your annual wellnes visit.   Preventive Care 76 Years and Older, Male Preventive care refers to lifestyle choices and visits with your health care provider that can promote health and wellness. What does preventive care include?  A yearly physical exam. This is also called an annual well check.  Dental exams once or twice a year.  Routine eye exams. Ask your health care provider how often you should have your eyes checked.  Personal lifestyle choices, including:  Daily care of your teeth and gums.  Regular physical activity.  Eating a healthy diet.  Avoiding tobacco and drug use.  Limiting alcohol use.  Practicing safe sex.  Taking low doses of aspirin every day.  Taking vitamin and mineral supplements as recommended by your health care provider. What happens during an annual well check? The  services and screenings done by your health care provider during your annual well check will depend on your age, overall health, lifestyle risk factors, and family history of disease. Counseling  Your health care provider may ask you questions about your:  Alcohol use.  Tobacco use.  Drug use.  Emotional well-being.  Home and relationship well-being.  Sexual activity.  Eating habits.  History of falls.  Memory and ability to understand (cognition).  Work and work Statistician. Screening  You may have the following tests or measurements:  Height, weight, and BMI.  Blood pressure.  Lipid and cholesterol levels. These may be checked every 5 years, or more frequently if you are over 17 years old.  Skin check.  Lung cancer screening. You may have this screening every year starting at age 56 if you have a 30-pack-year history of smoking and currently smoke or have quit within the past 15 years.  Fecal occult blood test (FOBT) of the stool. You may have this test every year starting at age 106.  Flexible sigmoidoscopy or colonoscopy. You may have a sigmoidoscopy every 5 years or a colonoscopy every 10 years starting at age 76.  Prostate cancer screening. Recommendations will vary depending on your family history and other risks.  Hepatitis C blood test.  Hepatitis B blood test.  Sexually transmitted disease (STD) testing.  Diabetes screening. This is done by checking your blood sugar (glucose) after you have not eaten for a while (fasting). You may have this done every 1-3 years.  Abdominal aortic aneurysm (AAA) screening. You may need this if you are a current or former smoker.  Osteoporosis. You may be screened starting at  age 76 if you are at high risk. Talk with your health care provider about your test results, treatment options, and if necessary, the need for more tests. Vaccines  Your health care provider may recommend certain vaccines, such as:  Influenza  vaccine. This is recommended every year.  Tetanus, diphtheria, and acellular pertussis (Tdap, Td) vaccine. You may need a Td booster every 10 years.  Zoster vaccine. You may need this after age 76.  Pneumococcal 13-valent conjugate (PCV13) vaccine. One dose is recommended after age 76.  Pneumococcal polysaccharide (PPSV23) vaccine. One dose is recommended after age 76. Talk to your health care provider about which screenings and vaccines you need and how often you need them. This information is not intended to replace advice given to you by your health care provider. Make sure you discuss any questions you have with your health care provider. Document Released: 01/18/2015 Document Revised: 09/11/2015 Document Reviewed: 10/23/2014 Elsevier Interactive Patient Education  2017 [place] Prevention in the Home Falls can cause injuries. They can happen to people of all ages. There are many things you can do to make your home safe and to help prevent falls. What can I do on the outside of my home?  Regularly fix the edges of walkways and driveways and fix any cracks.  Remove anything that might make you trip as you walk through a door, such as a raised step or threshold.  Trim any bushes or trees on the path to your home.  Use bright outdoor lighting.  Clear any walking paths of anything that might make someone trip, such as rocks or tools.  Regularly check to see if handrails are loose or broken. Make sure that both sides of any steps have handrails.  Any raised decks and porches should have guardrails on the edges.  Have any leaves, snow, or ice cleared regularly.  Use sand or salt on walking paths during winter.  Clean up any spills in your garage right away. This includes oil or grease spills. What can I do in the bathroom?  Use night lights.  Install grab bars by the toilet and in the tub and shower. Do not use towel bars as grab bars.  Use non-skid mats or decals  in the tub or shower.  If you need to sit down in the shower, use a plastic, non-slip stool.  Keep the floor dry. Clean up any water that spills on the floor as soon as it happens.  Remove soap buildup in the tub or shower regularly.  Attach bath mats securely with double-sided non-slip rug tape.  Do not have throw rugs and other things on the floor that can make you trip. What can I do in the bedroom?  Use night lights.  Make sure that you have a light by your bed that is easy to reach.  Do not use any sheets or blankets that are too big for your bed. They should not hang down onto the floor.  Have a firm chair that has side arms. You can use this for support while you get dressed.  Do not have throw rugs and other things on the floor that can make you trip. What can I do in the kitchen?  Clean up any spills right away.  Avoid walking on wet floors.  Keep items that you use a lot in easy-to-reach places.  If you need to reach something above you, use a strong step stool that has a grab bar.  Keep electrical cords out of the way.  Do not use floor polish or wax that makes floors slippery. If you must use wax, use non-skid floor wax.  Do not have throw rugs and other things on the floor that can make you trip. What can I do with my stairs?  Do not leave any items on the stairs.  Make sure that there are handrails on both sides of the stairs and use them. Fix handrails that are broken or loose. Make sure that handrails are as long as the stairways.  Check any carpeting to make sure that it is firmly attached to the stairs. Fix any carpet that is loose or worn.  Avoid having throw rugs at the top or bottom of the stairs. If you do have throw rugs, attach them to the floor with carpet tape.  Make sure that you have a light switch at the top of the stairs and the bottom of the stairs. If you do not have them, ask someone to add them for you. What else can I do to help  prevent falls?  Wear shoes that:  Do not have high heels.  Have rubber bottoms.  Are comfortable and fit you well.  Are closed at the toe. Do not wear sandals.  If you use a stepladder:  Make sure that it is fully opened. Do not climb a closed stepladder.  Make sure that both sides of the stepladder are locked into place.  Ask someone to hold it for you, if possible.  Clearly mark and make sure that you can see:  Any grab bars or handrails.  First and last steps.  Where the edge of each step is.  Use tools that help you move around (mobility aids) if they are needed. These include:  Canes.  Walkers.  Scooters.  Crutches.  Turn on the lights when you go into a dark area. Replace any light bulbs as soon as they burn out.  Set up your furniture so you have a clear path. Avoid moving your furniture around.  If any of your floors are uneven, fix them.  If there are any pets around you, be aware of where they are.  Review your medicines with your doctor. Some medicines can make you feel dizzy. This can increase your chance of falling. Ask your doctor what other things that you can do to help prevent falls. This information is not intended to replace advice given to you by your health care provider. Make sure you discuss any questions you have with your health care provider. Document Released: 10/18/2008 Document Revised: 05/30/2015 Document Reviewed: 01/26/2014 Elsevier Interactive Patient Education  2017 [employer].

## 2018-08-24 ENCOUNTER — Ambulatory Visit: Payer: Medicare HMO

## 2018-09-19 ENCOUNTER — Encounter: Payer: Self-pay | Admitting: Family Medicine

## 2018-09-19 DIAGNOSIS — M87052 Idiopathic aseptic necrosis of left femur: Secondary | ICD-10-CM | POA: Diagnosis not present

## 2018-09-19 DIAGNOSIS — R69 Illness, unspecified: Secondary | ICD-10-CM | POA: Diagnosis not present

## 2018-09-23 ENCOUNTER — Encounter
Admission: RE | Admit: 2018-09-23 | Discharge: 2018-09-23 | Disposition: A | Discharge status: Home / Self Care | Payer: Medicare HMO | Source: Ambulatory Visit | Attending: Orthopedic Surgery | Admitting: Orthopedic Surgery

## 2018-09-23 ENCOUNTER — Other Ambulatory Visit: Payer: Self-pay

## 2018-09-23 DIAGNOSIS — R001 Bradycardia, unspecified: Secondary | ICD-10-CM | POA: Insufficient documentation

## 2018-09-23 DIAGNOSIS — R9431 Abnormal electrocardiogram [ECG] [EKG]: Secondary | ICD-10-CM | POA: Diagnosis not present

## 2018-09-23 DIAGNOSIS — I10 Essential (primary) hypertension: Secondary | ICD-10-CM | POA: Diagnosis not present

## 2018-09-23 DIAGNOSIS — Z01818 Encounter for other preprocedural examination: Secondary | ICD-10-CM | POA: Diagnosis present

## 2018-09-23 HISTORY — DX: Benign neoplasm of pituitary gland: D35.2

## 2018-09-23 HISTORY — DX: Unspecified osteoarthritis, unspecified site: M19.90

## 2018-09-23 LAB — PROTIME-INR
INR: 1 (ref 0.8–1.2)
Prothrombin Time: 13.1 seconds (ref 11.4–15.2)

## 2018-09-23 LAB — SURGICAL PCR SCREEN
MRSA, PCR: NEGATIVE
Staphylococcus aureus: NEGATIVE

## 2018-09-23 LAB — APTT: aPTT: 28 seconds (ref 24–36)

## 2018-09-23 LAB — URINALYSIS, ROUTINE W REFLEX MICROSCOPIC
Bilirubin Urine: NEGATIVE
Glucose, UA: 50 mg/dL — AB
Hgb urine dipstick: NEGATIVE
Ketones, ur: NEGATIVE mg/dL
Leukocytes,Ua: NEGATIVE
Nitrite: NEGATIVE
Protein, ur: NEGATIVE mg/dL
Specific Gravity, Urine: 1.021 (ref 1.005–1.030)
pH: 5 (ref 5.0–8.0)

## 2018-09-23 LAB — CBC
HCT: 41.8 % (ref 39.0–52.0)
Hemoglobin: 12.7 g/dL — ABNORMAL LOW (ref 13.0–17.0)
MCH: 27.5 pg (ref 26.0–34.0)
MCHC: 30.4 g/dL (ref 30.0–36.0)
MCV: 90.7 fL (ref 80.0–100.0)
Platelets: 289 10*3/uL (ref 150–400)
RBC: 4.61 MIL/uL (ref 4.22–5.81)
RDW: 13.3 % (ref 11.5–15.5)
WBC: 5.8 10*3/uL (ref 4.0–10.5)
nRBC: 0 % (ref 0.0–0.2)

## 2018-09-23 LAB — BASIC METABOLIC PANEL
Anion gap: 8 (ref 5–15)
BUN: 13 mg/dL (ref 8–23)
CO2: 26 mmol/L (ref 22–32)
Calcium: 9.6 mg/dL (ref 8.9–10.3)
Chloride: 104 mmol/L (ref 98–111)
Creatinine, Ser: 1.05 mg/dL (ref 0.61–1.24)
GFR calc Af Amer: >60 mL/min (ref >60)
GFR calc non Af Amer: >60 mL/min (ref >60)
Glucose, Bld: 120 mg/dL — ABNORMAL HIGH (ref 70–99)
Potassium: 3.8 mmol/L (ref 3.5–5.1)
Sodium: 138 mmol/L (ref 135–145)

## 2018-09-23 LAB — SEDIMENTATION RATE: Sed Rate: 39 mm/hr — ABNORMAL HIGH (ref 0–20)

## 2018-09-23 LAB — TYPE AND SCREEN
ABO/RH(D): O POS
Antibody Screen: NEGATIVE

## 2018-09-23 NOTE — Patient Instructions (Signed)
Your procedure is scheduled on: 10-04-18 TUESDAY Report to Same Day Surgery 2nd floor medical mall [facility] Entrance-take elevator on left to 2nd floor.  Check in with surgery information desk.) To find out your arrival time please call [phone] between 1PM - 3PM on 10-03-18 MONDAY  Remember: Instructions that are not followed completely may result in serious medical risk, up to and including death, or upon the discretion of your surgeon and anesthesiologist your surgery may need to be rescheduled.    _x___ 1. Do not eat food after midnight the night before your procedure. NO GUM OR CANDY AFTER MIDNIGHT. You may drink clear liquids up to 2 hours before you are scheduled to arrive at the hospital for your procedure.  Do not drink clear liquids within 2 hours of your scheduled arrival to the hospital.  Clear liquids include  --Water or Apple juice without pulp  -Gatorade  --Black Coffee or Clear Tea (No milk, no creamers, do not add anything to the coffee or Tea)   ____Ensure clear carbohydrate drink on the way to the hospital for bariatric patients  ____Ensure clear carbohydrate drink 3 hours before surgery.   __x__ 2. No Alcohol for 24 hours before or after surgery.   __x__3. No Smoking or e-cigarettes for 24 prior to surgery.  Do not use any chewable tobacco products for at least 6 hour prior to surgery   ____  4. Bring all medications with you on the day of surgery if instructed.    __x__ 5. Notify your doctor if there is any change in your medical condition     (cold, fever, infections).    x___6. On the morning of surgery brush your teeth with toothpaste and water.  You may rinse your mouth with mouth wash if you wish.  Do not swallow any toothpaste or mouthwash.   Do not wear jewelry, make-up, hairpins, clips or nail polish.  Do not wear lotions, powders, or perfumes  Do not shave 48 hours prior to surgery. Men may shave face and neck.  Do not bring valuables to the  hospital.    [facility] is not responsible for any belongings or valuables.               Contacts, dentures or bridgework may not be worn into surgery.  Leave your suitcase in the car. After surgery it may be brought to your room.  For patients admitted to the hospital, discharge time is determined by your treatment team.  _  Patients discharged the day of surgery will not be allowed to drive home.  You will need someone to drive you home and stay with you the night of your procedure.    Please read over the following fact sheets that you were given:   [facility] Preparing for Surgery and or MRSA Information   _x___ TAKE THE FOLLOWING MEDICATION THE MORNING OF SURGERY WITH A SMALL SIP OF WATER. These include:  1. PROTONIX   2. TAKE A PROTONIX THE NIGHT BEFORE YOUR SURGERY   3.  4.  5.  6.  ____Fleets enema or Magnesium Citrate as directed.   _x___ Use CHG Soap or sage wipes as directed on instruction sheet   ____ Use inhalers on the day of surgery and bring to hospital day of surgery  ____ Stop Metformin and Janumet 2 days prior to surgery.    ____ Take 1/2 of usual insulin dose the night before surgery and none on the morning surgery.  ____ Follow recommendations from Cardiologist, Pulmonologist or PCP regarding stopping Aspirin, Coumadin, Plavix ,Eliquis, Effient, or Pradaxa, and Pletal.  X____Stop Anti-inflammatories such as Advil, Aleve, Ibuprofen, Motrin, Naproxen,MELOXICAM (MOBIC) Naprosyn, Goodies powders or aspirin products 5 DAYS PRIOR TO SURGERY-OK to take Tylenol    ____ Stop supplements until after surgery   ____ Bring C-Pap to the hospital.

## 2018-09-24 LAB — URINE CULTURE: Culture: NO GROWTH

## 2018-09-26 NOTE — Pre-Procedure Instructions (Signed)
Messaged dr Amie Critchley regarding abnormal EKG with possible anterior infarct and low hr 43. Anesthesia wanting medical clearance. Called Tiffany at Dr Collene Leyden office due to [employer] out of office (left message for casey as well) and told Tiffany pt needed clearance and that I was faxing clearance to Dr Rudene Christians and to Dr Durenda Age office

## 2018-09-29 ENCOUNTER — Ambulatory Visit: Payer: Self-pay | Admitting: Family Medicine

## 2018-09-29 NOTE — Pre-Procedure Instructions (Signed)
Called Dr Theodore Demark office regarding medical clearance. " Still waiting to hear." per [facility].

## 2018-09-30 ENCOUNTER — Other Ambulatory Visit: Payer: Self-pay

## 2018-09-30 ENCOUNTER — Encounter: Payer: Self-pay | Admitting: Family Medicine

## 2018-09-30 ENCOUNTER — Ambulatory Visit (INDEPENDENT_AMBULATORY_CARE_PROVIDER_SITE_OTHER): Payer: Medicare HMO | Admitting: Family Medicine

## 2018-09-30 ENCOUNTER — Other Ambulatory Visit
Admission: RE | Admit: 2018-09-30 | Discharge: 2018-09-30 | Disposition: A | Discharge status: Home / Self Care | Payer: Medicare HMO | Source: Ambulatory Visit | Attending: Orthopedic Surgery | Admitting: Orthopedic Surgery

## 2018-09-30 VITALS — BP 133/77 | HR 49 | Temp 97.7°F | Ht 72.0 in | Wt 213.0 lb

## 2018-09-30 DIAGNOSIS — Z20828 Contact with and (suspected) exposure to other viral communicable diseases: Secondary | ICD-10-CM | POA: Insufficient documentation

## 2018-09-30 DIAGNOSIS — Z01812 Encounter for preprocedural laboratory examination: Secondary | ICD-10-CM | POA: Insufficient documentation

## 2018-09-30 DIAGNOSIS — R001 Bradycardia, unspecified: Secondary | ICD-10-CM

## 2018-09-30 DIAGNOSIS — Z01818 Encounter for other preprocedural examination: Secondary | ICD-10-CM

## 2018-09-30 DIAGNOSIS — R9431 Abnormal electrocardiogram [ECG] [EKG]: Secondary | ICD-10-CM

## 2018-09-30 LAB — SARS CORONAVIRUS 2 (TAT 6-24 HRS): SARS Coronavirus 2: NEGATIVE

## 2018-09-30 NOTE — Progress Notes (Signed)
BP 133/77   Pulse (!) 49   Temp 97.7 F (36.5 C) (Oral)   Ht 6' (1.829 m)   Wt 213 lb (96.6 kg)   SpO2 100%   BMI 28.89 kg/m    Subjective:    Patient ID: Matthew Randolph, male    DOB: [birth year], 78 y.o.   MRN: [id number]  HPI: Matthew Randolph is a 78 y.o. male  Chief Complaint  Patient presents with  . Surgical Clearance    left hip replacement on 10/04/18  . Medication Refill    meloxicam   Patient presenting today for surgical clearance prior to upcoming total left hip replacement scheduled for early next week. Had pre-operative testing at the hospital and was referred back here for abnormal EKG showing 1st degree heart block and significant bradycardia at 43 bpm. Patient has typically had a low baseline HR in the 60s - 70s which he's always tolerated well. No recent medication changes, diet or lifestyle changes. Does not check home HRs. Takes only low dose lisinopril for his BPs, faithful with that regimen. Denies CP, SOB, dizziness, HAs.   Relevant past medical, surgical, family and social history reviewed and updated as indicated. Interim medical history since our last visit reviewed. Allergies and medications reviewed and updated.  Review of Systems  Per HPI unless specifically indicated above     Objective:    BP 133/77   Pulse (!) 49   Temp 97.7 F (36.5 C) (Oral)   Ht 6' (1.829 m)   Wt 213 lb (96.6 kg)   SpO2 100%   BMI 28.89 kg/m   Wt Readings from Last 3 Encounters:  09/30/18 213 lb (96.6 kg)  09/23/18 215 lb 9.8 oz (97.8 kg)  08/18/18 212 lb (96.2 kg)    Physical Exam Vitals signs and nursing note reviewed.  Constitutional:      Appearance: Normal appearance.  HENT:     Head: Atraumatic.  Eyes:     Extraocular Movements: Extraocular movements intact.     Conjunctiva/sclera: Conjunctivae normal.  Neck:     Musculoskeletal: Normal range of motion and neck supple.  Cardiovascular:     Rate and Rhythm: Regular rhythm. Bradycardia present.   Pulmonary:     Effort: Pulmonary effort is normal.     Breath sounds: Normal breath sounds.  Musculoskeletal: Normal range of motion.  Skin:    General: Skin is warm and dry.  Neurological:     General: No focal deficit present.     Mental Status: He is oriented to person, place, and time.  Psychiatric:        Mood and Affect: Mood normal.        Thought Content: Thought content normal.        Judgment: Judgment normal.     Results for orders placed or performed during the hospital encounter of 09/30/18  SARS CORONAVIRUS 2 (TAT 6-24 HRS) Nasopharyngeal Nasopharyngeal Swab   Specimen: Nasopharyngeal Swab  Result Value Ref Range   SARS Coronavirus 2 NEGATIVE NEGATIVE      Assessment & Plan:   Problem List Items Addressed This Visit    None    Visit Diagnoses    Abnormal EKG    -  Primary   Urgent referral placed to Cardiology for further eval of findings, persisting bradycardia and clearance for upcoming procedure if applicable   Relevant Orders   Ambulatory referral to Cardiology   Pre-op evaluation       Relevant Orders  Ambulatory referral to Cardiology   Bradycardia       HR only going up to 51 bpm today after several rechecks, but patient asymptomatic. Will refer to Cardiology for further eval and clearance   Relevant Orders   Ambulatory referral to Cardiology     25 minutes spent today in direct care and coordination of care for patient  Follow up plan: Return for Cardiology ASAP.

## 2018-10-03 ENCOUNTER — Ambulatory Visit (INDEPENDENT_AMBULATORY_CARE_PROVIDER_SITE_OTHER): Payer: Medicare HMO | Admitting: Cardiology

## 2018-10-03 ENCOUNTER — Telehealth: Payer: Self-pay | Admitting: Cardiology

## 2018-10-03 ENCOUNTER — Encounter: Payer: Self-pay | Admitting: Cardiology

## 2018-10-03 ENCOUNTER — Other Ambulatory Visit: Payer: Self-pay

## 2018-10-03 VITALS — BP 140/74 | HR 54 | Ht 72.0 in | Wt 212.5 lb

## 2018-10-03 DIAGNOSIS — I10 Essential (primary) hypertension: Secondary | ICD-10-CM

## 2018-10-03 DIAGNOSIS — Z01810 Encounter for preprocedural cardiovascular examination: Secondary | ICD-10-CM

## 2018-10-03 DIAGNOSIS — E782 Mixed hyperlipidemia: Secondary | ICD-10-CM

## 2018-10-03 DIAGNOSIS — IMO0001 Reserved for inherently not codable concepts without codable children: Secondary | ICD-10-CM

## 2018-10-03 DIAGNOSIS — F17200 Nicotine dependence, unspecified, uncomplicated: Secondary | ICD-10-CM

## 2018-10-03 DIAGNOSIS — R69 Illness, unspecified: Secondary | ICD-10-CM | POA: Diagnosis not present

## 2018-10-03 NOTE — Patient Instructions (Signed)
Medication Instructions:  Your physician recommends that you continue on your current medications as directed. Please refer to the Current Medication list given to you today.  If you need a refill on your cardiac medications before your next appointment, please call your pharmacy.   Lab work: None ordered If you have labs (blood work) drawn today and your tests are completely normal, you will receive your results only by: Marland Kitchen MyChart Message (if you have MyChart) OR . A paper copy in the mail If you have any lab test that is abnormal or we need to change your treatment, we will call you to review the results.  Testing/Procedures: None ordered  Follow-Up: At [facility], you and your health needs are our priority.  As part of our continuing mission to provide you with exceptional heart care, we have created designated Provider Care Teams.  These Care Teams include your primary Cardiologist (physician) and Advanced Practice Providers (APPs -  Physician Assistants and Nurse Practitioners) who all work together to provide you with the care you need, when you need it. You will need a follow up appointment as needed .   You may see Kate Sable, MD or one of the following Advanced Practice Providers on your designated Care Team:   Murray Hodgkins, NP Christell Faith, PA-C . Marrianne Mood, PA-C  Any Other Special Instructions Will Be Listed Below (If Applicable). You have been cleared from a cardiac standpoint for your upcoming surgery.

## 2018-10-03 NOTE — Telephone Encounter (Signed)
° °  [facility] Pre-operative Risk Assessment    Request for surgical clearance:  1. What type of surgery is being performed? Left Total Hip Arthroplasty    2. When is this surgery scheduled?  10-04-18   3. What type of clearance is required (medical clearance vs. Pharmacy clearance to hold med vs. Both)? Abnormal ekg Medical   4. Are there any medications that need to be held prior to surgery and how long? Not noted   5. Practice name and name of physician performing surgery? Dr[facility]   6. What is your office phone number [phone]    7.   What is your office fax number [phone]   8.   Anesthesia type (None, local, MAC, general) ? Not noted    Matthew Randolph 10/03/2018, 4:10 PM  _________________________________________________________________   (provider comments below)

## 2018-10-03 NOTE — Progress Notes (Signed)
Cardiology Office Note:    Date:  10/03/2018   ID:  Matthew Randolph, DOB September 12, 1940, MRN [id number]  PCP:  Matthew American, PA-C  Cardiologist:  Matthew Sable, MD  Electrophysiologist:  None   Referring MD: Matthew Randolph,*   Chief Complaint  Patient presents with  . other    Cardiac clearance due to bradycardia. Meds reviewed verbally with pt.    History of Present Illness:    Matthew Randolph is a 78 y.o. male with a hx of hypertension, hyperlipidemia, current smoker x40 years who presents for a pre-op evaluation prior to left hip replacement.  Patient evaluated by primary care provider who noted bradycardia on EKG.  Patient denies any symptoms of chest pain, shortness of breath, dizziness, orthopnea, edema.  He had his right hip replaced about a year ago.  He is planning his left hip to be replaced next week.  He states having a history of low heart rates.  He otherwise feels fine, apart from left hip pain and stiffness occasionally.  Past Medical History:  Diagnosis Date  . Arthritis   . Benign tumor of pituitary gland ([place])   . Chronic kidney disease   . ED (erectile dysfunction)   . Erectile dysfunction   . GERD (gastroesophageal reflux disease)   . High risk sexual behavior   . Hyperlipidemia   . Hypertension   . Pneumonia 2012  . Tobacco use disorder, continuous     Past Surgical History:  Procedure Laterality Date  . COLONOSCOPY  2015  . PITUITARY SURGERY  12/10/2015  . TOTAL HIP ARTHROPLASTY Right 09/09/2017   Procedure: TOTAL HIP ARTHROPLASTY ANTERIOR APPROACH;  Surgeon: Matthew Knows, MD;  Location: [facility] ORS;  Service: Orthopedics;  Laterality: Right;    Current Medications: Current Meds  Medication Sig  . acetaminophen (TYLENOL) 500 MG tablet Take 500 mg by mouth every 6 (six) hours as needed.  Marland Kitchen atorvastatin (LIPITOR) 10 MG tablet Take 1 tablet (10 mg total) by mouth daily. (Patient taking differently: Take 10 mg by mouth every evening. )   . lisinopril (PRINIVIL,ZESTRIL) 5 MG tablet Take 1 tablet (5 mg total) by mouth daily. (Patient taking differently: Take 5 mg by mouth every morning. )  . meloxicam (MOBIC) 15 MG tablet Take 1 tablet (15 mg total) by mouth as needed for pain.  . Menthol, Topical Analgesic, (ICY HOT EX) Apply 1 application topically daily as needed (pain).  . Multiple Vitamin (MULTIVITAMIN) tablet Take 1 tablet by mouth daily.  . pantoprazole (PROTONIX) 40 MG tablet Take 1 tablet (40 mg total) by mouth daily as needed. (Patient taking differently: Take 40 mg by mouth daily as needed (heartburn). )     Allergies:   Patient has no known allergies.   Social History   Socioeconomic History  . Marital status: Divorced    Spouse name: Not on file  . Number of children: 4  . Years of education: 12th  . Highest education level: High school graduate  Occupational History  . Occupation: Retired [employer]  . Financial resource strain: Not hard at all  . Food insecurity    Worry: Never true    Inability: Never true  . Transportation needs    Medical: No    Non-medical: No  Tobacco Use  . Smoking status: Current Every Day Smoker    Packs/day: 0.25    Years: 40.00    Pack years: 10.00    Types: Cigarettes  . Smokeless  tobacco: Never Used  . Tobacco comment: 2-3 cig daily  Substance and Sexual Activity  . Alcohol use: Yes    Alcohol/week: 1.0 standard drinks    Types: 1 Cans of beer per week    Comment: occ  . Drug use: No  . Sexual activity: Yes    Partners: Female  Lifestyle  . Physical activity    Days per week: 0 days    Minutes per session: 0 min  . Stress: Not at all  Relationships  . Social connections    Talks on phone: More than three times a week    Gets together: More than three times a week    Attends religious service: Never    Active member of club or organization: No    Attends meetings of clubs or organizations: Never    Relationship status: Living with  partner  Other Topics Concern  . Not on file  Social History Narrative   ** Merged History Encounter **         Family History: The patient's family history includes Aneurysm in his brother; Brain cancer in his mother; Cancer in his mother; Heart disease in his father and sister; Hypertension in his brother; Lung disease in his mother; Obesity in his sister; Pneumonia in his brother; Stroke in his father; Thyroid disease in his mother.  ROS:   Please see the history of present illness.     All other systems reviewed and are negative.  EKGs/Labs/Other Studies Reviewed:    The following studies were reviewed today:   EKG:  EKG is  ordered today.  The ekg ordered today demonstrates sinus bradycardia, heart rate 54, first-degree AV block.  Recent Labs: 04/20/2018: ALT 20 09/23/2018: BUN 13; Creatinine, Ser 1.05; Hemoglobin 12.7; Platelets 289; Potassium 3.8; Sodium 138  Recent Lipid Panel    Component Value Date/Time   CHOL 164 04/20/2018 1529   CHOL 243 (H) 01/23/2015 1048   TRIG 85 04/20/2018 1529   TRIG 107 01/23/2015 1048   HDL 68 04/20/2018 1529   VLDL 21 01/23/2015 1048   LDLCALC 79 04/20/2018 1529    Physical Exam:    VS:  BP 140/74 (BP Location: Right Arm, Patient Position: Sitting, Cuff Size: Normal)   Pulse (!) 54   Ht 6' (1.829 m)   Wt 212 lb 8 oz (96.4 kg)   SpO2 99%   BMI 28.82 kg/m     Wt Readings from Last 3 Encounters:  10/03/18 212 lb 8 oz (96.4 kg)  09/30/18 213 lb (96.6 kg)  09/23/18 215 lb 9.8 oz (97.8 kg)     GEN:  Well nourished, well developed in no acute distress HEENT: Normal NECK: No JVD; No carotid bruits LYMPHATICS: No lymphadenopathy CARDIAC: Bradycardia, regular, no murmurs, rubs, gallops RESPIRATORY:  Clear to auscultation without rales, wheezing or rhonchi  ABDOMEN: Soft, non-tender, non-distended MUSCULOSKELETAL:  No edema; No deformity  SKIN: Warm and dry NEUROLOGIC:  Alert and oriented x 3 PSYCHIATRIC:  Normal affect    ASSESSMENT:   78 year old gentleman who presents for preop evaluation prior to left hip replacement.  EKG noted to be sinus bradycardia.  After reviewing patient's prior EKGs in the chart, his EKG dated 09/02/2017 was also bradycardic with heart rate 47.  He underwent his prior right hip replacement the next month without any complications.  The Revised Cardiac Risk Index indicates that his Perioperative Risk of Major Cardiac Event is (%): 0.4.  Therefore, he is at low risk for perioperative  complications.    According to ACC/AHA guidelines, no further cardiovascular testing needed.  The patient may proceed to surgery at acceptable risk.       1. Preoperative cardiovascular examination   2. Benign essential HTN   3. Mixed hyperlipidemia   4. Smoking    PLAN:    In order of problems listed above:  1. Patient can proceed with surgery as planned.  He is at low cardiac risk for perioperative complications. 2. Blood pressure adequately controlled.  Continue current meds. 3. Continue Lipitor daily. 4. The patient was counseled on tobacco cessation today for 20 minutes.  Counseling included reviewing the risks of smoking tobacco products, how it impacts the patient's current medical diagnoses and different strategies for quitting.  Pharmacotherapy to aid in tobacco cessation was not prescribed today.   Medication Adjustments/Labs and Tests Ordered: Current medicines are reviewed at length with the patient today.  Concerns regarding medicines are outlined above.  Orders Placed This Encounter  Procedures  . EKG 12-Lead   No orders of the defined types were placed in this encounter.   Patient Instructions  Medication Instructions:  Your physician recommends that you continue on your current medications as directed. Please refer to the Current Medication list given to you today.  If you need a refill on your cardiac medications before your next appointment, please call your pharmacy.    Lab work: None ordered If you have labs (blood work) drawn today and your tests are completely normal, you will receive your results only by: Marland Kitchen MyChart Message (if you have MyChart) OR . A paper copy in the mail If you have any lab test that is abnormal or we need to change your treatment, we will call you to review the results.  Testing/Procedures: None ordered  Follow-Up: At [facility], you and your health needs are our priority.  As part of our continuing mission to provide you with exceptional heart care, we have created designated Provider Care Teams.  These Care Teams include your primary Cardiologist (physician) and Advanced Practice Providers (APPs -  Physician Assistants and Nurse Practitioners) who all work together to provide you with the care you need, when you need it. You will need a follow up appointment as needed .   You may see Matthew Sable, MD or one of the following Advanced Practice Providers on your designated Care Team:   Murray Hodgkins, NP Christell Faith, PA-C . Marrianne Mood, PA-C  Any Other Special Instructions Will Be Listed Below (If Applicable). You have been cleared from a cardiac standpoint for your upcoming surgery.      Signed, Matthew Sable, MD  10/03/2018 11:47 AM    [place]

## 2018-10-04 ENCOUNTER — Inpatient Hospital Stay
Admission: RE | Admit: 2018-10-04 | Discharge: 2018-10-06 | DRG: 464 | Disposition: A | Discharge status: Home Health | Payer: Medicare HMO | Attending: Orthopedic Surgery | Admitting: Orthopedic Surgery

## 2018-10-04 ENCOUNTER — Inpatient Hospital Stay: Payer: Medicare HMO

## 2018-10-04 ENCOUNTER — Inpatient Hospital Stay: Payer: Medicare HMO | Admitting: Anesthesiology

## 2018-10-04 ENCOUNTER — Other Ambulatory Visit: Payer: Self-pay

## 2018-10-04 ENCOUNTER — Encounter
Admission: RE | Disposition: A | Discharge status: Home Health | Payer: Self-pay | Source: Home / Self Care | Attending: Orthopedic Surgery

## 2018-10-04 DIAGNOSIS — D62 Acute posthemorrhagic anemia: Secondary | ICD-10-CM | POA: Diagnosis not present

## 2018-10-04 DIAGNOSIS — G8918 Other acute postprocedural pain: Secondary | ICD-10-CM

## 2018-10-04 DIAGNOSIS — E785 Hyperlipidemia, unspecified: Secondary | ICD-10-CM | POA: Diagnosis present

## 2018-10-04 DIAGNOSIS — Z79899 Other long term (current) drug therapy: Secondary | ICD-10-CM | POA: Diagnosis not present

## 2018-10-04 DIAGNOSIS — F17210 Nicotine dependence, cigarettes, uncomplicated: Secondary | ICD-10-CM | POA: Diagnosis present

## 2018-10-04 DIAGNOSIS — Z96642 Presence of left artificial hip joint: Secondary | ICD-10-CM

## 2018-10-04 DIAGNOSIS — M1612 Unilateral primary osteoarthritis, left hip: Principal | ICD-10-CM | POA: Diagnosis present

## 2018-10-04 DIAGNOSIS — Z7982 Long term (current) use of aspirin: Secondary | ICD-10-CM

## 2018-10-04 DIAGNOSIS — Z471 Aftercare following joint replacement surgery: Secondary | ICD-10-CM | POA: Diagnosis not present

## 2018-10-04 DIAGNOSIS — K219 Gastro-esophageal reflux disease without esophagitis: Secondary | ICD-10-CM | POA: Diagnosis present

## 2018-10-04 DIAGNOSIS — M87052 Idiopathic aseptic necrosis of left femur: Secondary | ICD-10-CM | POA: Diagnosis not present

## 2018-10-04 DIAGNOSIS — R69 Illness, unspecified: Secondary | ICD-10-CM | POA: Diagnosis not present

## 2018-10-04 DIAGNOSIS — I129 Hypertensive chronic kidney disease with stage 1 through stage 4 chronic kidney disease, or unspecified chronic kidney disease: Secondary | ICD-10-CM | POA: Diagnosis present

## 2018-10-04 DIAGNOSIS — N189 Chronic kidney disease, unspecified: Secondary | ICD-10-CM | POA: Diagnosis present

## 2018-10-04 DIAGNOSIS — Z20828 Contact with and (suspected) exposure to other viral communicable diseases: Secondary | ICD-10-CM | POA: Diagnosis present

## 2018-10-04 DIAGNOSIS — D2372 Other benign neoplasm of skin of left lower limb, including hip: Secondary | ICD-10-CM | POA: Diagnosis not present

## 2018-10-04 DIAGNOSIS — Z419 Encounter for procedure for purposes other than remedying health state, unspecified: Secondary | ICD-10-CM

## 2018-10-04 DIAGNOSIS — D1724 Benign lipomatous neoplasm of skin and subcutaneous tissue of left leg: Secondary | ICD-10-CM | POA: Diagnosis present

## 2018-10-04 DIAGNOSIS — D1779 Benign lipomatous neoplasm of other sites: Secondary | ICD-10-CM | POA: Diagnosis not present

## 2018-10-04 HISTORY — PX: TOTAL HIP ARTHROPLASTY: SHX124

## 2018-10-04 HISTORY — PX: LIPOMA EXCISION: SHX5283

## 2018-10-04 LAB — CREATININE, SERUM
Creatinine, Ser: 1.01 mg/dL (ref 0.61–1.24)
GFR calc Af Amer: >60 mL/min (ref >60)
GFR calc non Af Amer: >60 mL/min (ref >60)

## 2018-10-04 LAB — CBC
HCT: 36.6 % — ABNORMAL LOW (ref 39.0–52.0)
Hemoglobin: 11.1 g/dL — ABNORMAL LOW (ref 13.0–17.0)
MCH: 27.8 pg (ref 26.0–34.0)
MCHC: 30.3 g/dL (ref 30.0–36.0)
MCV: 91.5 fL (ref 80.0–100.0)
Platelets: 244 10*3/uL (ref 150–400)
RBC: 4 MIL/uL — ABNORMAL LOW (ref 4.22–5.81)
RDW: 13.1 % (ref 11.5–15.5)
WBC: 10.1 10*3/uL (ref 4.0–10.5)
nRBC: 0 % (ref 0.0–0.2)

## 2018-10-04 SURGERY — ARTHROPLASTY, HIP, TOTAL, ANTERIOR APPROACH
Anesthesia: Spinal | Site: Hip | Laterality: Left

## 2018-10-04 MED ORDER — ENOXAPARIN SODIUM 40 MG/0.4ML ~~LOC~~ SOLN
40.0000 mg | SUBCUTANEOUS | Status: DC
  Administered 2018-10-05 – 2018-10-06 (×2): 40 mg via SUBCUTANEOUS
  Filled 2018-10-04 (×2): qty 0.4

## 2018-10-04 MED ORDER — BUPIVACAINE-EPINEPHRINE 0.25% -1:200000 IJ SOLN
INTRAMUSCULAR | Status: DC | PRN
  Administered 2018-10-04: 30 mL

## 2018-10-04 MED ORDER — PROPOFOL 10 MG/ML IV BOLUS
INTRAVENOUS | Status: DC | PRN
  Administered 2018-10-04: 20 mg via INTRAVENOUS
  Administered 2018-10-04: 40 mg via INTRAVENOUS
  Administered 2018-10-04: 20 mg via INTRAVENOUS

## 2018-10-04 MED ORDER — ACETAMINOPHEN 325 MG PO TABS: 325.0000 mg | ORAL_TABLET | Freq: Four times a day (QID) | ORAL | Status: DC | PRN

## 2018-10-04 MED ORDER — ACETAMINOPHEN 10 MG/ML IV SOLN
INTRAVENOUS | Status: DC | PRN
  Administered 2018-10-04: 1000 mg via INTRAVENOUS

## 2018-10-04 MED ORDER — HYDROCODONE-ACETAMINOPHEN 5-325 MG PO TABS: 1.0000 | ORAL_TABLET | ORAL | Status: DC | PRN

## 2018-10-04 MED ORDER — CHLORHEXIDINE GLUCONATE CLOTH 2 % EX PADS: 6.0000 | MEDICATED_PAD | Freq: Every day | CUTANEOUS | Status: DC

## 2018-10-04 MED ORDER — EPHEDRINE SULFATE 50 MG/ML IJ SOLN
INTRAMUSCULAR | Status: AC
  Filled 2018-10-04: qty 1

## 2018-10-04 MED ORDER — SODIUM CHLORIDE 0.9 % IV SOLN
INTRAVENOUS | Status: DC
  Administered 2018-10-04 – 2018-10-05 (×2): via INTRAVENOUS

## 2018-10-04 MED ORDER — HYDROCODONE-ACETAMINOPHEN 7.5-325 MG PO TABS
1.0000 | ORAL_TABLET | ORAL | Status: DC | PRN
  Administered 2018-10-04 – 2018-10-05 (×3): 1 via ORAL
  Filled 2018-10-04 (×3): qty 1

## 2018-10-04 MED ORDER — ALUM & MAG HYDROXIDE-SIMETH 200-200-20 MG/5ML PO SUSP: 30.0000 mL | ORAL | Status: DC | PRN

## 2018-10-04 MED ORDER — BISACODYL 10 MG RE SUPP
10.0000 mg | Freq: Every day | RECTAL | Status: DC | PRN
  Administered 2018-10-06: 10:00:00 10 mg via RECTAL
  Filled 2018-10-04: qty 1

## 2018-10-04 MED ORDER — METOCLOPRAMIDE HCL 10 MG PO TABS: 5.0000 mg | ORAL_TABLET | Freq: Three times a day (TID) | ORAL | Status: DC | PRN

## 2018-10-04 MED ORDER — SODIUM CHLORIDE 0.9 % IV SOLN
INTRAVENOUS | Status: DC | PRN
  Administered 2018-10-04: 25 ug/min via INTRAVENOUS

## 2018-10-04 MED ORDER — PHENOL 1.4 % MT LIQD
1.0000 | OROMUCOSAL | Status: DC | PRN
  Filled 2018-10-04: qty 177

## 2018-10-04 MED ORDER — METHOCARBAMOL 500 MG PO TABS: 500.0000 mg | ORAL_TABLET | Freq: Four times a day (QID) | ORAL | Status: DC | PRN

## 2018-10-04 MED ORDER — MORPHINE SULFATE (PF) 2 MG/ML IV SOLN: 0.5000 mg | INTRAVENOUS | Status: DC | PRN

## 2018-10-04 MED ORDER — MIDAZOLAM HCL 2 MG/2ML IJ SOLN
INTRAMUSCULAR | Status: AC
  Filled 2018-10-04: qty 2

## 2018-10-04 MED ORDER — CEFAZOLIN SODIUM-DEXTROSE 2-4 GM/100ML-% IV SOLN
INTRAVENOUS | Status: AC
  Filled 2018-10-04: qty 100

## 2018-10-04 MED ORDER — ATORVASTATIN CALCIUM 10 MG PO TABS
10.0000 mg | ORAL_TABLET | Freq: Every evening | ORAL | Status: DC
  Administered 2018-10-04 – 2018-10-05 (×2): 10 mg via ORAL
  Filled 2018-10-04 (×2): qty 1

## 2018-10-04 MED ORDER — PHENYLEPHRINE HCL (PRESSORS) 10 MG/ML IV SOLN
INTRAVENOUS | Status: DC | PRN
  Administered 2018-10-04 (×3): 100 ug via INTRAVENOUS

## 2018-10-04 MED ORDER — MENTHOL 3 MG MT LOZG
1.0000 | LOZENGE | OROMUCOSAL | Status: DC | PRN
  Filled 2018-10-04: qty 9

## 2018-10-04 MED ORDER — PANTOPRAZOLE SODIUM 40 MG PO TBEC: 40.0000 mg | DELAYED_RELEASE_TABLET | Freq: Every day | ORAL | Status: DC | PRN

## 2018-10-04 MED ORDER — PROPOFOL 500 MG/50ML IV EMUL
INTRAVENOUS | Status: DC | PRN
  Administered 2018-10-04: 20 ug/kg/min via INTRAVENOUS

## 2018-10-04 MED ORDER — ONDANSETRON HCL 4 MG/2ML IJ SOLN
INTRAMUSCULAR | Status: DC | PRN
  Administered 2018-10-04: 4 mg via INTRAVENOUS

## 2018-10-04 MED ORDER — CEFAZOLIN SODIUM-DEXTROSE 2-4 GM/100ML-% IV SOLN
2.0000 g | Freq: Four times a day (QID) | INTRAVENOUS | Status: AC
  Administered 2018-10-04 – 2018-10-05 (×3): 2 g via INTRAVENOUS
  Filled 2018-10-04 (×3): qty 100

## 2018-10-04 MED ORDER — ONDANSETRON HCL 4 MG/2ML IJ SOLN: 4.0000 mg | Freq: Once | INTRAMUSCULAR | Status: DC | PRN

## 2018-10-04 MED ORDER — MIDAZOLAM HCL 5 MG/5ML IJ SOLN
INTRAMUSCULAR | Status: DC | PRN
  Administered 2018-10-04: 2 mg via INTRAVENOUS

## 2018-10-04 MED ORDER — EPHEDRINE SULFATE 50 MG/ML IJ SOLN
INTRAMUSCULAR | Status: DC | PRN
  Administered 2018-10-04 (×3): 5 mg via INTRAVENOUS

## 2018-10-04 MED ORDER — ONDANSETRON HCL 4 MG/2ML IJ SOLN: 4.0000 mg | Freq: Four times a day (QID) | INTRAMUSCULAR | Status: DC | PRN

## 2018-10-04 MED ORDER — SODIUM CHLORIDE 0.9 % IV SOLN
INTRAVENOUS | Status: DC | PRN
  Administered 2018-10-04: 60 mL

## 2018-10-04 MED ORDER — FENTANYL CITRATE (PF) 100 MCG/2ML IJ SOLN
INTRAMUSCULAR | Status: DC | PRN
  Administered 2018-10-04 (×2): 50 ug via INTRAVENOUS

## 2018-10-04 MED ORDER — BUPIVACAINE HCL (PF) 0.5 % IJ SOLN
INTRAMUSCULAR | Status: DC | PRN
  Administered 2018-10-04: 3 mL

## 2018-10-04 MED ORDER — NEOMYCIN-POLYMYXIN B GU 40-200000 IR SOLN
Status: DC | PRN
  Administered 2018-10-04: 4 mL

## 2018-10-04 MED ORDER — CEFAZOLIN SODIUM-DEXTROSE 2-4 GM/100ML-% IV SOLN
2.0000 g | Freq: Once | INTRAVENOUS | Status: AC
  Administered 2018-10-04: 2 g via INTRAVENOUS

## 2018-10-04 MED ORDER — MAGNESIUM HYDROXIDE 400 MG/5ML PO SUSP
30.0000 mL | Freq: Every day | ORAL | Status: DC | PRN
  Administered 2018-10-05 – 2018-10-06 (×2): 30 mL via ORAL
  Filled 2018-10-04 (×2): qty 30

## 2018-10-04 MED ORDER — PANTOPRAZOLE SODIUM 40 MG PO TBEC
40.0000 mg | DELAYED_RELEASE_TABLET | Freq: Every day | ORAL | Status: DC
  Administered 2018-10-05 – 2018-10-06 (×2): 40 mg via ORAL
  Filled 2018-10-04 (×2): qty 1

## 2018-10-04 MED ORDER — ADULT MULTIVITAMIN W/MINERALS CH
1.0000 | ORAL_TABLET | Freq: Every day | ORAL | Status: DC
  Administered 2018-10-05 – 2018-10-06 (×2): 1 via ORAL
  Filled 2018-10-04 (×2): qty 1

## 2018-10-04 MED ORDER — DIPHENHYDRAMINE HCL 12.5 MG/5ML PO ELIX: 12.5000 mg | ORAL_SOLUTION | ORAL | Status: DC | PRN

## 2018-10-04 MED ORDER — FENTANYL CITRATE (PF) 100 MCG/2ML IJ SOLN
INTRAMUSCULAR | Status: AC
  Filled 2018-10-04: qty 2

## 2018-10-04 MED ORDER — ACETAMINOPHEN 10 MG/ML IV SOLN
INTRAVENOUS | Status: AC
  Filled 2018-10-04: qty 100

## 2018-10-04 MED ORDER — METHOCARBAMOL 1000 MG/10ML IJ SOLN
500.0000 mg | Freq: Four times a day (QID) | INTRAVENOUS | Status: DC | PRN
  Filled 2018-10-04: qty 5

## 2018-10-04 MED ORDER — ZOLPIDEM TARTRATE 5 MG PO TABS: 5.0000 mg | ORAL_TABLET | Freq: Every evening | ORAL | Status: DC | PRN

## 2018-10-04 MED ORDER — TRAMADOL HCL 50 MG PO TABS
50.0000 mg | ORAL_TABLET | Freq: Four times a day (QID) | ORAL | Status: DC
  Administered 2018-10-04 – 2018-10-06 (×6): 50 mg via ORAL
  Filled 2018-10-04 (×7): qty 1

## 2018-10-04 MED ORDER — MAGNESIUM CITRATE PO SOLN
1.0000 | Freq: Once | ORAL | Status: DC | PRN
  Filled 2018-10-04: qty 296

## 2018-10-04 MED ORDER — LACTATED RINGERS IV SOLN
INTRAVENOUS | Status: DC
  Administered 2018-10-04: 14:00:00 via INTRAVENOUS
  Administered 2018-10-04: 10:00:00 50 mL/h via INTRAVENOUS

## 2018-10-04 MED ORDER — ONDANSETRON HCL 4 MG PO TABS: 4.0000 mg | ORAL_TABLET | Freq: Four times a day (QID) | ORAL | Status: DC | PRN

## 2018-10-04 MED ORDER — DOCUSATE SODIUM 100 MG PO CAPS
100.0000 mg | ORAL_CAPSULE | Freq: Two times a day (BID) | ORAL | Status: DC
  Administered 2018-10-04 – 2018-10-06 (×4): 100 mg via ORAL
  Filled 2018-10-04 (×3): qty 1

## 2018-10-04 MED ORDER — LISINOPRIL 5 MG PO TABS
5.0000 mg | ORAL_TABLET | ORAL | Status: DC
  Administered 2018-10-05 – 2018-10-06 (×2): 5 mg via ORAL
  Filled 2018-10-04 (×2): qty 1

## 2018-10-04 MED ORDER — METOCLOPRAMIDE HCL 5 MG/ML IJ SOLN: 5.0000 mg | Freq: Three times a day (TID) | INTRAMUSCULAR | Status: DC | PRN

## 2018-10-04 MED ORDER — FENTANYL CITRATE (PF) 100 MCG/2ML IJ SOLN: 25.0000 ug | INTRAMUSCULAR | Status: DC | PRN

## 2018-10-04 SURGICAL SUPPLY — 62 items
BLADE SAGITTAL AGGR TOOTH XLG (BLADE) ×3 IMPLANT
BNDG COHESIVE 6X5 TAN STRL LF (GAUZE/BANDAGES/DRESSINGS) ×9 IMPLANT
CANISTER SUCT 1200ML W/VALVE (MISCELLANEOUS) ×3 IMPLANT
CANISTER WOUND CARE 500ML ATS (WOUND CARE) ×3 IMPLANT
CHLORAPREP W/TINT 26 (MISCELLANEOUS) ×3 IMPLANT
COVER BACK TABLE REUSABLE LG (DRAPES) ×3 IMPLANT
COVER WAND RF STERILE (DRAPES) IMPLANT
CUP ACETAB VERSA DBL 28X58 DMI (Orthopedic Implant) ×3 IMPLANT
DRAPE 3/4 80X56 (DRAPES) ×9 IMPLANT
DRAPE C-ARM XRAY 36X54 (DRAPES) ×3 IMPLANT
DRAPE INCISE IOBAN 66X60 STRL (DRAPES) IMPLANT
DRAPE POUCH INSTRU U-SHP 10X18 (DRAPES) ×3 IMPLANT
DRESSING SURGICEL FIBRLLR 1X2 (HEMOSTASIS) ×4 IMPLANT
DRSG OPSITE POSTOP 4X6 (GAUZE/BANDAGES/DRESSINGS) ×3 IMPLANT
DRSG OPSITE POSTOP 4X8 (GAUZE/BANDAGES/DRESSINGS) IMPLANT
DRSG SURGICEL FIBRILLAR 1X2 (HEMOSTASIS) ×6
ELECT BLADE 6.5 EXT (BLADE) ×3 IMPLANT
ELECT REM PT RETURN 9FT ADLT (ELECTROSURGICAL) ×3
ELECTRODE REM PT RTRN 9FT ADLT (ELECTROSURGICAL) ×2 IMPLANT
GLOVE BIOGEL PI IND STRL 9 (GLOVE) ×2 IMPLANT
GLOVE BIOGEL PI INDICATOR 9 (GLOVE) ×1
GLOVE SURG SYN 9.0  PF PI (GLOVE) ×2
GLOVE SURG SYN 9.0 PF PI (GLOVE) ×4 IMPLANT
GOWN SRG 2XL LVL 4 RGLN SLV (GOWNS) ×2 IMPLANT
GOWN STRL NON-REIN 2XL LVL4 (GOWNS) ×1
GOWN STRL REUS W/ TWL LRG LVL3 (GOWN DISPOSABLE) ×2 IMPLANT
GOWN STRL REUS W/TWL LRG LVL3 (GOWN DISPOSABLE) ×1
HEMOVAC 400CC 10FR (MISCELLANEOUS) IMPLANT
HIP FEM HD L 28 (Head) ×3 IMPLANT
HOLDER FOLEY CATH W/STRAP (MISCELLANEOUS) ×3 IMPLANT
HOOD PEEL AWAY FLYTE STAYCOOL (MISCELLANEOUS) ×3 IMPLANT
KIT PREVENA INCISION MGT 13 (CANNISTER) ×3 IMPLANT
MAT ABSORB  FLUID 56X50 GRAY (MISCELLANEOUS) ×1
MAT ABSORB FLUID 56X50 GRAY (MISCELLANEOUS) ×2 IMPLANT
NDL SAFETY ECLIPSE 18X1.5 (NEEDLE) ×2 IMPLANT
NEEDLE HYPO 18GX1.5 SHARP (NEEDLE) ×1
NEEDLE SPNL 20GX3.5 QUINCKE YW (NEEDLE) ×6 IMPLANT
NS IRRIG 1000ML POUR BTL (IV SOLUTION) ×3 IMPLANT
PACK HIP COMPR (MISCELLANEOUS) ×3 IMPLANT
SCALPEL PROTECTED #10 DISP (BLADE) ×6 IMPLANT
SHELL ACETABULAR SZ0 58MM (Shell) ×3 IMPLANT
SOL PREP PVP 2OZ (MISCELLANEOUS)
SOLUTION PREP PVP 2OZ (MISCELLANEOUS) IMPLANT
SPONGE DRAIN TRACH 4X4 STRL 2S (GAUZE/BANDAGES/DRESSINGS) IMPLANT
STAPLER SKIN PROX 35W (STAPLE) ×3 IMPLANT
STEM FEMORAL SZ5 STD COLLARED (Stem) ×3 IMPLANT
STRAP SAFETY 5IN WIDE (MISCELLANEOUS) ×3 IMPLANT
SUT DVC 2 QUILL PDO  T11 36X36 (SUTURE) ×1
SUT DVC 2 QUILL PDO T11 36X36 (SUTURE) ×2 IMPLANT
SUT SILK 0 (SUTURE) ×1
SUT SILK 0 30XBRD TIE 6 (SUTURE) ×2 IMPLANT
SUT V-LOC 90 ABS DVC 3-0 CL (SUTURE) ×3 IMPLANT
SUT VIC AB 1 CT1 36 (SUTURE) ×3 IMPLANT
SUT VIC AB 3-0 SH 27 (SUTURE) ×1
SUT VIC AB 3-0 SH 27X BRD (SUTURE) ×2 IMPLANT
SYR 20ML LL LF (SYRINGE) ×3 IMPLANT
SYR 30ML LL (SYRINGE) ×3 IMPLANT
SYR 50ML LL SCALE MARK (SYRINGE) ×6 IMPLANT
SYR BULB IRRIG 60ML STRL (SYRINGE) ×3 IMPLANT
TAPE MICROFOAM 4IN (TAPE) ×3 IMPLANT
TOWEL OR 17X26 4PK STRL BLUE (TOWEL DISPOSABLE) ×3 IMPLANT
TRAY FOLEY MTR SLVR 16FR STAT (SET/KITS/TRAYS/PACK) ×3 IMPLANT

## 2018-10-04 NOTE — Evaluation (Signed)
Physical Therapy Evaluation Patient Details Name: Matthew Randolph MRN: [id number] DOB: 1940-01-18 Today's Date: 10/04/2018   History of Present Illness  78 y/o male s/p L total hip replacement (anterior approach), 10/04/18.  Clinical Impression  Pt eager to work with PT and generally did well.  He had expected pain and weakness in anterior hip but was able participate well resisted and AROM exercises, did not need direct assist to get to EOB and was able to ambulte ~35 ft with safe cadence and expected UE reliance.  He did not have symptoms during ambulation, but some lightheadedness on returning to the recliner.      Follow Up Recommendations Home health PT    Equipment Recommendations  None recommended by PT    Recommendations for Other Services       Precautions / Restrictions Precautions Precautions: Anterior Hip;Fall Restrictions Weight Bearing Restrictions: Yes LLE Weight Bearing: Weight bearing as tolerated      Mobility  Bed Mobility Overal bed mobility: Needs Assistance Bed Mobility: Supine to Sit     Supine to sit: Min guard     General bed mobility comments: Pt was slow and labored with getting to EOB, but able to do so w/o assist  Transfers Overall transfer level: Modified independent Equipment used: Rolling walker (2 wheeled)             General transfer comment: Pt is able to rise to to standing w/o direct assist, heavy reliance on UEs  Ambulation/Gait Ambulation/Gait assistance: Min guard Gait Distance (Feet): 35 Feet Assistive device: Rolling walker (2 wheeled)       General Gait Details: Pt initially with slow and gaurded gait, no lightheadedness during ambulation, able to attain more consistent cadence and increased speed  Stairs            Wheelchair Mobility    Modified Rankin (Stroke Patients Only)       Balance Overall balance assessment: Modified Independent                                            Pertinent Vitals/Pain Pain Assessment: 0-10 Pain Score: 4     Home Living Family/patient expects to be discharged to:: Private residence Living Arrangements: Spouse/significant other Available Help at Discharge: Family Type of Home: House Home Access: Stairs to enter Entrance Stairs-Rails: None Entrance Stairs-Number of Steps: 2   Home Equipment: Walker - 2 wheels      Prior Function Level of Independence: Independent         Comments: Pt recovered well from R total hip 9/19.     Hand Dominance        Extremity/Trunk Assessment   Upper Extremity Assessment Upper Extremity Assessment: Overall WFL for tasks assessed    Lower Extremity Assessment Lower Extremity Assessment: Overall WFL for tasks assessed(expected post-op weakness in L LE)       Communication   Communication: No difficulties  Cognition Arousal/Alertness: Awake/alert Behavior During Therapy: WFL for tasks assessed/performed Overall Cognitive Status: Within Functional Limits for tasks assessed                                        General Comments General comments (skin integrity, edema, etc.): after ambulation pt with some lightheadedness on returning to sitting, BP was  70s/40s. nursing notified.  O2 high 90s t/o the session    Exercises Total Joint Exercises Ankle Circles/Pumps: AROM;10 reps Quad Sets: Strengthening;10 reps Gluteal Sets: AROM;10 reps Short Arc Quad: Strengthening;10 reps Heel Slides: AROM;5 reps Hip ABduction/ADduction: AROM;10 reps   Assessment/Plan    PT Assessment Patient needs continued PT services  PT Problem List Decreased strength;Decreased range of motion;Decreased activity tolerance;Decreased balance;Decreased mobility;Decreased coordination;Decreased knowledge of use of DME;Decreased safety awareness;Pain       PT Treatment Interventions Gait training;Stair training;Functional mobility training;Therapeutic activities;Therapeutic  exercise;Balance training;DME instruction;Patient/family education    PT Goals (Current goals can be found in the Care Plan section)  Acute Rehab PT Goals Patient Stated Goal: go home PT Goal Formulation: With patient Time For Goal Achievement: 10/18/18 Potential to Achieve Goals: Good    Frequency BID   Barriers to discharge        Co-evaluation               AM-PAC PT "6 Clicks" Mobility  Outcome Measure Help needed turning from your back to your side while in a flat bed without using bedrails?: None Help needed moving from lying on your back to sitting on the side of a flat bed without using bedrails?: None Help needed moving to and from a bed to a chair (including a wheelchair)?: A Little Help needed standing up from a chair using your arms (e.g., wheelchair or bedside chair)?: A Little Help needed to walk in hospital room?: A Little Help needed climbing 3-5 steps with a railing? : A Little 6 Click Score: 20    End of Session Equipment Utilized During Treatment: Gait belt Activity Tolerance: Patient tolerated treatment well Patient left: with chair alarm set;with call bell/phone within reach;with family/visitor present;with nursing/sitter in room Nurse Communication: Mobility status PT Visit Diagnosis: Muscle weakness (generalized) (M62.81);Difficulty in walking, not elsewhere classified (R26.2);Pain Pain - Right/Left: Left Pain - part of body: Hip    Time: [id number] PT Time Calculation (min) (ACUTE ONLY): 34 min   Charges:   PT Evaluation $PT Eval Low Complexity: 1 Low PT Treatments $Gait Training: 8-22 mins $Therapeutic Exercise: 8-22 mins        Kreg Shropshire, DPT 10/04/2018, 6:25 PM

## 2018-10-04 NOTE — H&P (Signed)
Reviewed paper H+P, will be scanned into chart. No changes noted.  

## 2018-10-04 NOTE — Anesthesia Post-op Follow-up Note (Signed)
Anesthesia QCDR form completed.        

## 2018-10-04 NOTE — Telephone Encounter (Signed)
Dr. Garen Lah cleared this patient for surgery yesterday 10/03/18. I have faxed his office note.  I will remove from preop pool.

## 2018-10-04 NOTE — Anesthesia Procedure Notes (Addendum)
Spinal  Patient location during procedure: OR Start time: 10/04/2018 11:40 AM End time: 10/04/2018 11:51 AM Staffing Anesthesiologist: Gunnar Fusi, MD Resident/CRNA: Lowry Bowl, CRNA Performed: anesthesiologist  Preanesthetic Checklist Completed: patient identified, site marked, surgical consent, pre-op evaluation, timeout performed, IV checked, risks and benefits discussed and monitors and equipment checked Spinal Block Patient position: sitting Prep: DuraPrep Patient monitoring: heart rate, cardiac monitor, continuous pulse ox and blood pressure Approach: midline Location: L3-4 Injection technique: single-shot Needle Needle type: Whitacre  Needle gauge: 22 G Needle length: 9 cm Assessment Sensory level: T4 Additional Notes First attempt by [employer]

## 2018-10-04 NOTE — Anesthesia Preprocedure Evaluation (Signed)
Anesthesia Evaluation  Patient identified by MRN, date of birth, ID band Patient awake    Reviewed: Allergy & Precautions, NPO status , Patient's Chart, lab work & pertinent test results  History of Anesthesia Complications Negative for: history of anesthetic complications  Airway Mallampati: III       Dental  (+) Upper Dentures, Partial Lower, Missing, Loose, Chipped   Pulmonary neg sleep apnea, neg COPD, Current Smoker and Patient abstained from smoking.,           Cardiovascular hypertension, Pt. on medications (-) Past MI and (-) CHF (-) dysrhythmias (-) Valvular Problems/Murmurs     Neuro/Psych neg Seizures    GI/Hepatic Neg liver ROS, GERD  Medicated and Controlled,  Endo/Other  neg diabetes  Renal/GU Renal InsufficiencyRenal disease     Musculoskeletal   Abdominal   Peds  Hematology   Anesthesia Other Findings   Reproductive/Obstetrics                             Anesthesia Physical Anesthesia Plan  ASA: III  Anesthesia Plan: Spinal   Post-op Pain Management:    Induction:   PONV Risk Score and Plan:   Airway Management Planned:   Additional Equipment:   Intra-op Plan:   Post-operative Plan:   Informed Consent: I have reviewed the patients History and Physical, chart, labs and discussed the procedure including the risks, benefits and alternatives for the proposed anesthesia with the patient or authorized representative who has indicated his/her understanding and acceptance.       Plan Discussed with:   Anesthesia Plan Comments:         Anesthesia Quick Evaluation

## 2018-10-04 NOTE — Progress Notes (Signed)
Pt to floor from OR

## 2018-10-04 NOTE — Op Note (Signed)
10/04/2018  1:37 PM  PATIENT:  Matthew Randolph  78 y.o. male  PRE-OPERATIVE DIAGNOSIS:  PRIMARY OSTEOARTHRITIS OF LEFT HIP and lipoma left hip  POST-OPERATIVE DIAGNOSIS:  OSTEOARTHRITIS LEFT HIP, LIPOMA LEFT HIP  PROCEDURE:  Procedure(s): TOTAL HIP ARTHROPLASTY ANTERIOR APPROACH (Left) EXCISION LIPOMA LEFT HIP (Left)  SURGEON: Laurene Footman, MD  ASSISTANTS: None  ANESTHESIA:   spinal  EBL:  Total I/O In: 1000 [I.V.:1000] Out: 400 [Urine:200; Blood:200]  BLOOD ADMINISTERED:none  DRAINS: none   LOCAL MEDICATIONS USED:  MARCAINE    and OTHER Exparel  SPECIMEN:  Source of Specimen:  Left femoral head, lipoma left hip  DISPOSITION OF SPECIMEN:  PATHOLOGY  COUNTS:  YES  TOURNIQUET:  * No tourniquets in log *  IMPLANTS: Medacta AMIS 5 standard stem, L metal 28 mm head, 58 mm Mpact DM cup and liner  DICTATION: .Dragon Dictation   The patient was brought to the operating room and after spinal anesthesia was obtained patient was placed on the operative table with the ipsilateral foot into the Medacta attachment, contralateral leg on a well-padded table. C-arm was brought in and preop template x-ray taken. After prepping and draping in usual sterile fashion appropriate patient identification and timeout procedures were completed. Anterior approach to the hip was obtained and centered over the greater trochanter and TFL muscle. The subcutaneous tissue was incised hemostasis being achieved by electrocautery. TFL fascia was incised and the muscle retracted laterally deep retractor placed. The lateral femoral circumflex vessels were identified and ligated. The anterior capsule was exposed and a capsulotomy performed. The neck was identified and a femoral neck cut carried out with a saw. The head was removed without difficulty and showed sclerotic femoral head and acetabulum. Reaming was carried out to 58 mm and a 58 mm cup trial gave appropriate tightness to the acetabular component a 58 DM  cup was impacted into position. The leg was then externally rotated and ischiofemoral and pubofemoral releases carried out. The femur was sequentially broached to a size 5, size 5 standard with M and then L head trials were placed and the final components chosen. The 5 standard stem was inserted along with a metal L 28 mm head and 58 mm liner. The hip was reduced and was stable the wound was thoroughly irrigated with fibrillar placed along the posterior capsule and medial neck. The deep fascia ws closed using a heavy Quill after infiltration of 30 cc of quarter percent Sensorcaine with epinephrine.3-0 V-loc to close the skin with skin staples.  Attention was then turned to the lipoma with approximately 4 cm transverse incision this was made directly over the lipoma the lipoma was separated from the surrounding tissue quite easily and was removed and sent as specimen separately.  The subcutaneous tissue was approximated using 3-0 Vicryl and skin staples applied followed by honeycomb dressing incisional wound VAC applied  patient was sent to recovery in stable condition.   PLAN OF CARE: Admit to inpatient

## 2018-10-04 NOTE — Progress Notes (Signed)
Informed Dr. Ronelle Nigh regarding pt's bradycardia.  HR stays around mid 40s and sometimes drops to 39.  Pt admitted to day surgery with HR in 40s.  Pt BP stable, 110/49, NAD.  No new orders given by provider.  Will continue to monitor pt.

## 2018-10-04 NOTE — Transfer of Care (Signed)
Immediate Anesthesia Transfer of Care Note  Patient: Matthew Randolph  Procedure(s) Performed: TOTAL HIP ARTHROPLASTY ANTERIOR APPROACH (Left Hip) EXCISION LIPOMA LEFT HIP (Left )  Patient Location: PACU  Anesthesia Type:General and Spinal  Level of Consciousness: awake, oriented and patient cooperative  Airway & Oxygen Therapy: Patient Spontanous Breathing and Patient connected to face mask oxygen  Post-op Assessment: Report given to RN and Post -op Vital signs reviewed and stable  Post vital signs: Reviewed and stable  Last Vitals:  Vitals Value Taken Time  BP 87/50 10/04/18 1334  Temp    Pulse 71 10/04/18 1338  Resp 16 10/04/18 1338  SpO2 100 % 10/04/18 1338  Vitals shown include unvalidated device data.  Last Pain:  Vitals:   10/04/18 1336  TempSrc:   PainSc: (P) 0-No pain      Patients Stated Pain Goal: 0 ([id number])  Complications: No apparent anesthesia complications

## 2018-10-05 LAB — BASIC METABOLIC PANEL
Anion gap: 10 (ref 5–15)
BUN: 13 mg/dL (ref 8–23)
CO2: 24 mmol/L (ref 22–32)
Calcium: 8.5 mg/dL — ABNORMAL LOW (ref 8.9–10.3)
Chloride: 105 mmol/L (ref 98–111)
Creatinine, Ser: 1.1 mg/dL (ref 0.61–1.24)
GFR calc Af Amer: >60 mL/min (ref >60)
GFR calc non Af Amer: >60 mL/min (ref >60)
Glucose, Bld: 133 mg/dL — ABNORMAL HIGH (ref 70–99)
Potassium: 4.4 mmol/L (ref 3.5–5.1)
Sodium: 139 mmol/L (ref 135–145)

## 2018-10-05 LAB — SURGICAL PATHOLOGY

## 2018-10-05 LAB — CBC
HCT: 32.9 % — ABNORMAL LOW (ref 39.0–52.0)
Hemoglobin: 10.2 g/dL — ABNORMAL LOW (ref 13.0–17.0)
MCH: 27.5 pg (ref 26.0–34.0)
MCHC: 31 g/dL (ref 30.0–36.0)
MCV: 88.7 fL (ref 80.0–100.0)
Platelets: 245 10*3/uL (ref 150–400)
RBC: 3.71 MIL/uL — ABNORMAL LOW (ref 4.22–5.81)
RDW: 13.1 % (ref 11.5–15.5)
WBC: 6.6 10*3/uL (ref 4.0–10.5)
nRBC: 0 % (ref 0.0–0.2)

## 2018-10-05 MED ORDER — HYDROCODONE-ACETAMINOPHEN 5-325 MG PO TABS: 1.0000 | ORAL_TABLET | ORAL | 0 refills | Status: DC | PRN

## 2018-10-05 MED ORDER — METHOCARBAMOL 500 MG PO TABS: 500.0000 mg | ORAL_TABLET | Freq: Four times a day (QID) | ORAL | 0 refills | Status: DC | PRN

## 2018-10-05 MED ORDER — DOCUSATE SODIUM 100 MG PO CAPS: 100.0000 mg | ORAL_CAPSULE | Freq: Two times a day (BID) | ORAL | 0 refills | Status: DC

## 2018-10-05 NOTE — Discharge Summary (Signed)
Physician Discharge Summary  Patient ID: Matthew Randolph MRN: [id number] DOB/AGE: Jun 02, 1940 78 y.o.  Admit date: 10/04/2018 Discharge date: 10/06/2018 Admission Diagnoses:  PRIMARY OSTEOARTHRITIS OF LEFT HIP   Discharge Diagnoses: Patient Active Problem List   Diagnosis Date Noted  . Status post total hip replacement, left 10/04/2018  . Pituitary adenoma ([place]) 04/20/2018  . Status post total hip replacement, right 09/09/2017  . Elevated PSA 08/19/2016  . Advance care planning 08/19/2016  . Anemia 08/19/2016  . GERD (gastroesophageal reflux disease) 08/19/2016  . Osteoarthritis of left hip 02/17/2016  . Primary osteoarthritis of left knee 02/14/2016  . Hip pain 01/23/2015  . Strain of psoas muscle 09/26/2014  . Tobacco abuse 04/19/2014  . Benign essential HTN 04/19/2014  . ED (erectile dysfunction) 04/19/2014  . CKD (chronic kidney disease) 04/19/2014  . High risk sexual behavior 04/19/2014  . Hyperlipemia 04/19/2014    Past Medical History:  Diagnosis Date  . Arthritis   . Benign tumor of pituitary gland ([place])   . Chronic kidney disease   . ED (erectile dysfunction)   . Erectile dysfunction   . GERD (gastroesophageal reflux disease)   . High risk sexual behavior   . Hyperlipidemia   . Hypertension   . Pneumonia 2012  . Tobacco use disorder, continuous      Transfusion: none   Consultants (if any):   Discharged Condition: Improved  Hospital Course: Matthew Randolph is an 78 y.o. male who was admitted 10/04/2018 with a diagnosis of left hip osteoarthritis and went to the operating room on 10/04/2018 and underwent the above named procedures.    Surgeries: Procedure(s): TOTAL HIP ARTHROPLASTY ANTERIOR APPROACH EXCISION LIPOMA LEFT HIP on 10/04/2018 Patient tolerated the surgery well. Taken to PACU where she was stabilized and then transferred to the orthopedic floor.  Started on Lovenox 40 mg  q 24 hrs. Foot pumps applied bilaterally at 80 mm. Heels elevated on bed  with rolled towels. No evidence of DVT. Negative [place]. Physical therapy started on day #1 for gait training and transfer. OT started day #1 for ADL and assisted devices.  Patient's foley was d/c on day #1. Patient's IV was d/c on day #2.  On post op day #2  patient was stable and ready for discharge to home with HHPT.  Implants: Medacta AMIS 5 standard stem, L metal 28 mm head, 58 mm Mpact DM cup and liner  He was given perioperative antibiotics:  Anti-infectives (From admission, onward)   Start     Dose/Rate Route Frequency Ordered Stop   10/04/18 1700  ceFAZolin (ANCEF) IVPB 2g/100 mL premix     2 g 200 mL/hr over 30 Minutes Intravenous Every 6 hours 10/04/18 1536 10/05/18 0630   10/04/18 0930  ceFAZolin (ANCEF) IVPB 2g/100 mL premix     2 g 200 mL/hr over 30 Minutes Intravenous  Once 10/04/18 0926 10/04/18 1156   10/04/18 0927  ceFAZolin (ANCEF) 2-4 GM/100ML-% IVPB    Note to Pharmacy: Nyra Jabs   : cabinet override      10/04/18 0927 10/04/18 1156    .  He was given sequential compression devices, early ambulation, and aspirin TEDs for DVT prophylaxis.  He benefited maximally from the hospital stay and there were no complications.    Recent vital signs:  Vitals:   10/05/18 1211 10/05/18 1532  BP: 133/68 115/70  Pulse: 70 77  Resp:    Temp: 97.9 F (36.6 C) 99.1 F (37.3 C)  SpO2: 100% 98%  Recent laboratory studies:  Lab Results  Component Value Date   HGB 10.2 (L) 10/05/2018   HGB 11.1 (L) 10/04/2018   HGB 12.7 (L) 09/23/2018   Lab Results  Component Value Date   WBC 6.6 10/05/2018   PLT 245 10/05/2018   Lab Results  Component Value Date   INR 1.0 09/23/2018   Lab Results  Component Value Date   NA 139 10/05/2018   K 4.4 10/05/2018   CL 105 10/05/2018   CO2 24 10/05/2018   BUN 13 10/05/2018   CREATININE 1.10 10/05/2018   GLUCOSE 133 (H) 10/05/2018    Discharge Medications:   Allergies as of 10/05/2018   No Known Allergies      Medication List    STOP taking these medications   acetaminophen 500 MG tablet Commonly known as: TYLENOL   meloxicam 15 MG tablet Commonly known as: MOBIC     TAKE these medications   aspirin 325 MG EC tablet Take 1 tablet (325 mg total) by mouth daily with breakfast.   atorvastatin 10 MG tablet Commonly known as: LIPITOR Take 1 tablet (10 mg total) by mouth daily. What changed: when to take this   docusate sodium 100 MG capsule Commonly known as: COLACE Take 1 capsule (100 mg total) by mouth 2 (two) times daily.   HYDROcodone-acetaminophen 5-325 MG tablet Commonly known as: NORCO/VICODIN Take 1 tablet by mouth every 4 (four) hours as needed for moderate pain (pain score 4-6).   ICY HOT EX Apply 1 application topically daily as needed (pain).   lisinopril 5 MG tablet Commonly known as: ZESTRIL Take 1 tablet (5 mg total) by mouth daily. What changed: when to take this   methocarbamol 500 MG tablet Commonly known as: ROBAXIN Take 1 tablet (500 mg total) by mouth every 6 (six) hours as needed for muscle spasms.   multivitamin tablet Take 1 tablet by mouth daily.   pantoprazole 40 MG tablet Commonly known as: PROTONIX Take 1 tablet (40 mg total) by mouth daily as needed. What changed: reasons to take this            Durable Medical Equipment  (From admission, onward)         Start     Ordered   10/04/18 1536  DME Walker rolling  Once    Question:  Patient needs a walker to treat with the following condition  Answer:  Status post total hip replacement, left   10/04/18 1536   10/04/18 1536  DME 3 n 1  Once     10/04/18 1536   10/04/18 1536  DME Bedside commode  Once    Question:  Patient needs a bedside commode to treat with the following condition  Answer:  Status post total hip replacement, left   10/04/18 1536          Diagnostic Studies: Dg Hip Operative Unilat W Or W/o Pelvis Left  Result Date: 10/04/2018 CLINICAL DATA:  Left hip replacement  EXAM: OPERATIVE LEFT HIP WITH PELVIS COMPARISON:  None. FLUOROSCOPY TIME:  Radiation Exposure Index (as provided by the fluoroscopic device): 6.4 mGy If the device does not provide the exposure index: Fluoroscopy Time:  18 seconds Number of Acquired Images:  2 FINDINGS: Initial images demonstrate degenerative change of the left hip joint. Subsequent image shows left hip prosthesis in satisfactory position. IMPRESSION: Status post left hip replacement Electronically Signed   By: Inez Catalina M.D.   On: 10/04/2018 13:21   Dg Hip Unilat  W Or W/o Pelvis 2-3 Views Left  Result Date: 10/04/2018 CLINICAL DATA:  Status post left total hip arthroplasty. EXAM: DG HIP (WITH OR WITHOUT PELVIS) 2-3V LEFT COMPARISON:  Radiograph of September 26, 2014. FINDINGS: The left femoral and acetabular components appear to be well situated. Expected postoperative changes are seen in the surrounding soft tissues. IMPRESSION: Status post left total hip arthroplasty. Electronically Signed   By: Marijo Conception M.D.   On: 10/04/2018 15:08    Disposition:     Follow-up Information    Duanne Guess, PA-C Follow up in 2 week(s).   Specialties: Orthopedic Surgery, Emergency Medicine Contact information: [place] [address] [phone]            Signed: Feliberto Gottron 10/05/2018, 7:59 PM

## 2018-10-05 NOTE — Anesthesia Postprocedure Evaluation (Signed)
Anesthesia Post Note  Patient: Matthew Randolph  Procedure(s) Performed: TOTAL HIP ARTHROPLASTY ANTERIOR APPROACH (Left Hip) EXCISION LIPOMA LEFT HIP (Left )  Patient location during evaluation: Nursing Unit Anesthesia Type: Spinal Level of consciousness: awake, oriented and awake and alert Pain management: pain level controlled Vital Signs Assessment: post-procedure vital signs reviewed and stable Respiratory status: spontaneous breathing, nonlabored ventilation and respiratory function stable Cardiovascular status: blood pressure returned to baseline and stable Postop Assessment: no headache and no backache Anesthetic complications: no     Last Vitals:  Vitals:   10/05/18 0356 10/05/18 0757  BP: 131/65 124/67  Pulse: 86 69  Resp: 18   Temp: 36.8 C 37.5 C  SpO2: 100% 98%    Last Pain:  Vitals:   10/05/18 0830  TempSrc:   PainSc: 4                  Proofreader

## 2018-10-05 NOTE — Evaluation (Addendum)
Occupational Therapy Evaluation Patient Details Name: Matthew Randolph MRN: [id number] DOB: Feb 12, 1940 Today's Date: 10/05/2018    History of Present Illness 78 y/o male s/p L total hip replacement (anterior approach), 10/04/18.   Clinical Impression   Matthew Randolph was seen for OT evaluation this date, POD#1 from above surgery. Pt endorses that he was independent in all ADLs/IADLs prior to surgery. He is eager to return to PLOF with less pain and improved safety and independence. Pt currently requires minimal assist for LB dressing and bathing while in seated position due to pain and limited AROM of L hip. Pt instructed in self care skills, falls prevention strategies, home/routines modifications, DME/AE for LB bathing and dressing tasks, and compression stocking mgt strategies. Pt would benefit from additional instruction in self care skills and techniques to help maintain precautions with or without assistive devices to support recall and carryover prior to discharge. Recommend HHOT upon discharge to maximize pt safety and functional independence during meaningful occupations of daily life.       Follow Up Recommendations  Home health OT    Equipment Recommendations  None recommended by OT(Pt has BSC.)    Recommendations for Other Services       Precautions / Restrictions Precautions Precautions: Anterior Hip;Fall Restrictions Weight Bearing Restrictions: Yes LLE Weight Bearing: Weight bearing as tolerated      Mobility Bed Mobility Overal bed mobility: Needs Assistance Bed Mobility: Supine to Sit     Supine to sit: Min assist;HOB elevated     General bed mobility comments: Deferred. Pt up in recliner at startend of session.  Transfers Overall transfer level: Needs assistance Equipment used: Rolling walker (2 wheeled) Transfers: Sit to/from Stand Sit to Stand: Min guard;Supervision         General transfer comment: Very minimal physical assist to rise from bed,  supervision for stand to sit in recliner, heavy use of UE. Verbal cues for hand placement.    Balance Overall balance assessment: Needs assistance Sitting-balance support: Feet supported;No upper extremity supported Sitting balance-Leahy Scale: Good Sitting balance - Comments: Steady sitting, reaching within BOS. Good static and dynamic seated balance during LB dressing activity.   Standing balance support: During functional activity Standing balance-Leahy Scale: Fair                             ADL either performed or assessed with clinical judgement   ADL Overall ADL's : Needs assistance/impaired                                       General ADL Comments: Pt req. min assist for LB ADL mgt in seated position due to pain and decreased ROM in the L hip. Able to use sock-aid and LH reacher to doff/don socks on this date. VC's for technique.     Vision Baseline Vision/History: Wears glasses Wears Glasses: Reading only;Distance only Patient Visual Report: No change from baseline Additional Comments: Pt endorses hx of R eye surgery.     Perception     Praxis      Pertinent Vitals/Pain Pain Assessment: 0-10 Pain Score: 6  Pain Location: L hip Pain Descriptors / Indicators: Sore;Guarding;Discomfort;Grimacing Pain Intervention(s): Limited activity within patient's tolerance;Monitored during session;Repositioned     Hand Dominance Right   Extremity/Trunk Assessment Upper Extremity Assessment Upper Extremity Assessment: Overall WFL for tasks assessed(Bue WNL.  Grossly 5/5)   Lower Extremity Assessment Lower Extremity Assessment: Defer to PT evaluation;Overall WFL for tasks assessed       Communication Communication Communication: No difficulties   Cognition Arousal/Alertness: Awake/alert Behavior During Therapy: WFL for tasks assessed/performed Overall Cognitive Status: Within Functional Limits for tasks assessed                                  General Comments: may be slightly HOH   General Comments  Pt able to lift RW off ground to navigate a cord in room. no complaints of light headedness this session, cueing for continued breathing with exertion    Exercises  Other Exercises Other Exercises: Pt educated in falls prevention strategies, safe use of AE/DME for LB ADL mgt, routines modifications, and compression stocking mgt. Verbalizes understanding and return demonstrates use of sock aid. Would benefit from opportunity to trial [facility] reacher for LB Dressing.   Shoulder Instructions      Home Living Family/patient expects to be discharged to:: Private residence Living Arrangements: Spouse/significant other Available Help at Discharge: Family Type of Home: House Home Access: Stairs to enter Technical brewer of Steps: 2 Entrance Stairs-Rails: None Home Layout: One level     Bathroom Shower/Tub: Tub/shower unit;Curtain   Bathroom Toilet: Standard(W/ BSC over toilet)     Home Equipment: Bedside commode;Adaptive equipment;Walker - 2 wheels;Hand held shower head Adaptive Equipment: Reacher        Prior Functioning/Environment Level of Independence: Independent        Comments: Pt recovered well from R total hip 9/19.        OT Problem List: Decreased strength;Decreased coordination;Pain;Decreased range of motion;Decreased activity tolerance;Decreased safety awareness;Impaired balance (sitting and/or standing);Decreased knowledge of use of DME or AE;Decreased knowledge of precautions      OT Treatment/Interventions: Self-care/ADL training;Balance training;Therapeutic exercise;Therapeutic activities;DME and/or AE instruction;Patient/family education    OT Goals(Current goals can be found in the care plan section) Acute Rehab OT Goals Patient Stated Goal: go home OT Goal Formulation: With patient Time For Goal Achievement: 10/19/18 Potential to Achieve Goals: Good ADL Goals Pt Will Perform  Lower Body Bathing: with modified independence;sitting/lateral leans(With LRAD PRN for improved safety and functional independence) Pt Will Perform Lower Body Dressing: with min guard assist;with min assist;sit to/from stand(With LRAD PRN for improved safety and functional independence) Pt Will Transfer to Toilet: with modified independence;ambulating(With LRAD PRN for improved safety and functional independence) Pt Will Perform Toileting - Clothing Manipulation and hygiene: sit to/from stand;with modified independence(With LRAD PRN for improved safety and functional independence)  OT Frequency: Min 1X/week   Barriers to D/C:            Co-evaluation              AM-PAC OT "6 Clicks" Daily Activity     Outcome Measure Help from another person eating meals?: None Help from another person taking care of personal grooming?: A Little Help from another person toileting, which includes using toliet, bedpan, or urinal?: A Little Help from another person bathing (including washing, rinsing, drying)?: A Little Help from another person to put on and taking off regular upper body clothing?: A Little Help from another person to put on and taking off regular lower body clothing?: A Little 6 Click Score: 19   End of Session Equipment Utilized During Treatment: Gait belt;Rolling walker  Activity Tolerance: Patient tolerated treatment well Patient left: in chair;with call  bell/phone within reach;with chair alarm set;with SCD's reapplied;with nursing/sitter in room  OT Visit Diagnosis: Other abnormalities of gait and mobility (R26.89);Pain Pain - Right/Left: Left Pain - part of body: Hip                Time: [id number] OT Time Calculation (min): 22 min Charges:  OT General Charges $OT Visit: 1 Visit OT Evaluation $OT Eval Low Complexity: 1 Low OT Treatments $Self Care/Home Management : 8-22 mins  Shara Blazing, M.S., OTR/L Ascom: [phone] 10/05/18, 10:44 AM

## 2018-10-05 NOTE — Progress Notes (Signed)
Physical Therapy Treatment Patient Details Name: Matthew Randolph MRN: [id number] DOB: [birth year] Today's Date: 10/05/2018    History of Present Illness 78 y/o male s/p L total hip replacement (anterior approach), 10/04/18.    PT Comments    Patient up in chair, requested to utilize commode. Session focused on promoting functional mobility and stair training. Patient also performed sit <> stand transfers from recliner and from [facility] over standard toilet, minA needed from lower surfaces as well as due to increased pt reported "stiffness". The patient ambulated ~258ft with RW and supervision, again exhibited improved LE weight bearing and gait velocity with time, though deficits still noted. Pt performed stair navigation with CGAx2 for safety, second trial pt exhibited improved safety/balance/sequencing of tasks. Pt up in chair at end of session, all needs in reach. The patient would benefit from further skilled PT intervention to continue to progress towards goals.      Follow Up Recommendations  Home health PT     Equipment Recommendations  None recommended by PT    Recommendations for Other Services       Precautions / Restrictions Precautions Precautions: Anterior Hip;Fall Precaution Booklet Issued: Yes (comment) Restrictions Weight Bearing Restrictions: Yes LLE Weight Bearing: Weight bearing as tolerated    Mobility  Bed Mobility               General bed mobility comments: Deferred. Pt up in recliner at startend of session.  Transfers Overall transfer level: Needs assistance Equipment used: Rolling walker (2 wheeled) Transfers: Sit to/from Stand Sit to Stand: Min assist;Min guard         General transfer comment: minA from low chair and BSC. heavy use of UE  Ambulation/Gait Ambulation/Gait assistance: Supervision Gait Distance (Feet): 220 Feet Assistive device: Rolling walker (2 wheeled)       General Gait Details: Pt with time improved LE weight bearing  and gait velocity, though still decreased velocity noted.   Stairs Stairs: Yes Stairs assistance: Min guard;+2 safety/equipment Stair Management: With walker;No rails Number of Stairs: 4 General stair comments: ascended/descended 2 stairs twice. small LOB posteriorly noted first trial, able to correct with minA/CGA from PT.Second attempt balance improved   Wheelchair Mobility    Modified Rankin (Stroke Patients Only)       Balance Overall balance assessment: Needs assistance Sitting-balance support: Feet supported Sitting balance-Leahy Scale: Good                                      Cognition Arousal/Alertness: Awake/alert Behavior During Therapy: WFL for tasks assessed/performed Overall Cognitive Status: Within Functional Limits for tasks assessed                                 General Comments: may be slightly HOH      Exercises Other Exercises Other Exercises: Pt utilized BSC over standard commode for several minutes. minA to transfer, able to wash hands at sink without UE support    General Comments        Pertinent Vitals/Pain Pain Assessment: 0-10 Pain Score: 4  Pain Location: L hip Pain Descriptors / Indicators: Sore;Guarding;Discomfort;Grimacing Pain Intervention(s): Limited activity within patient's tolerance;Monitored during session;Repositioned    Home Living                      Prior Function  PT Goals (current goals can now be found in the care plan section) Acute Rehab PT Goals Patient Stated Goal: go home PT Goal Formulation: With patient Time For Goal Achievement: 10/18/18 Potential to Achieve Goals: Good Progress towards PT goals: Progressing toward goals    Frequency    BID      PT Plan Current plan remains appropriate    Co-evaluation              AM-PAC PT "6 Clicks" Mobility   Outcome Measure  Help needed turning from your back to your side while in a flat bed  without using bedrails?: A Little Help needed moving from lying on your back to sitting on the side of a flat bed without using bedrails?: A Little Help needed moving to and from a bed to a chair (including a wheelchair)?: A Little Help needed standing up from a chair using your arms (e.g., wheelchair or bedside chair)?: A Little Help needed to walk in hospital room?: A Little Help needed climbing 3-5 steps with a railing? : A Little 6 Click Score: 18    End of Session Equipment Utilized During Treatment: Gait belt Activity Tolerance: Patient tolerated treatment well Patient left: with chair alarm set;with call bell/phone within reach;in chair;with SCD's reapplied Nurse Communication: Mobility status PT Visit Diagnosis: Muscle weakness (generalized) (M62.81);Difficulty in walking, not elsewhere classified (R26.2);Pain Pain - Right/Left: Left Pain - part of body: Hip     Time: 1357-1440 PT Time Calculation (min) (ACUTE ONLY): 43 min  Charges:  $Therapeutic Exercise: 23-37 mins $Therapeutic Activity: 8-22 mins                    Lieutenant Diego PT, DPT 3:54 PM,10/05/18 [phone]

## 2018-10-05 NOTE — Progress Notes (Signed)
Physical Therapy Treatment Patient Details Name: Matthew Randolph MRN: [id number] DOB: Oct 14, 1940 Today's Date: 10/05/2018    History of Present Illness 78 y/o male s/p L total hip replacement (anterior approach), 10/04/18.    PT Comments    Patient alert, agreeable to PT prior to breakfast, pain in L hip 3-4/10 at rest. Patient needed physical assistance with LE exercises this session due to increased pain, AAROM for SLR though activation improved with repetition. No physical assist needed for SAQ, and pt instructed to perform quad sets every hour at least 10 times. Supine to sit with minA for LLE management and weight shift. Sit to stand with minA from bed surface, supervision for stand to sit in recliner, heavy use of UE and verbal cues for hand placement. The patient ambulated ~124ft with RW and progressed to supervision. Pt progressed from step to gait pattern to step through, and increased LE weight bearing with cues and time. Overall the patient demonstrated good progression towards goals and would benefit from further skilled PT intervention.     Follow Up Recommendations  Home health PT     Equipment Recommendations  None recommended by PT    Recommendations for Other Services       Precautions / Restrictions Precautions Precautions: Anterior Hip;Fall Restrictions Weight Bearing Restrictions: Yes LLE Weight Bearing: Weight bearing as tolerated    Mobility  Bed Mobility Overal bed mobility: Needs Assistance Bed Mobility: Supine to Sit     Supine to sit: Min assist;HOB elevated     General bed mobility comments: handheld assist for weight shift, assistance with LLE  Transfers Overall transfer level: Needs assistance Equipment used: Rolling walker (2 wheeled) Transfers: Sit to/from Stand Sit to Stand: Min assist         General transfer comment: Very minimal physical assist to rise from bed, supervision for stand to sit in recliner, heavy use of UE. Verbal cues  for hand placement.  Ambulation/Gait Ambulation/Gait assistance: Min guard;Supervision Gait Distance (Feet): 170 Feet Assistive device: Rolling walker (2 wheeled)       General Gait Details: Pt progressed from step to, to step through with some verbal cueing, improved LE weight bearing with time as well as gait velocity   Stairs             Wheelchair Mobility    Modified Rankin (Stroke Patients Only)       Balance Overall balance assessment: Needs assistance Sitting-balance support: Feet supported Sitting balance-Leahy Scale: Good       Standing balance-Leahy Scale: Fair                              Cognition Arousal/Alertness: Awake/alert Behavior During Therapy: WFL for tasks assessed/performed Overall Cognitive Status: Within Functional Limits for tasks assessed                                 General Comments: may be slightly [facility]      Exercises Total Joint Exercises Quad Sets: Strengthening;10 reps;AROM;Supine;Left Short Arc Quad: Strengthening;10 reps;AROM;Left Heel Slides: AAROM;Left;10 reps;Supine Straight Leg Raises: AAROM;Strengthening;Left;10 reps    General Comments General comments (skin integrity, edema, etc.): Pt able to lift RW off ground to navigate a cord in room. no complaints of light headedness this session, cueing for continued breathing with exertion      Pertinent Vitals/Pain Pain Score: 4  Home Living                      Prior Function            PT Goals (current goals can now be found in the care plan section) Progress towards PT goals: Progressing toward goals    Frequency    BID      PT Plan Current plan remains appropriate    Co-evaluation              AM-PAC PT "6 Clicks" Mobility   Outcome Measure  Help needed turning from your back to your side while in a flat bed without using bedrails?: A Little Help needed moving from lying on your back to sitting on  the side of a flat bed without using bedrails?: A Little Help needed moving to and from a bed to a chair (including a wheelchair)?: A Little Help needed standing up from a chair using your arms (e.g., wheelchair or bedside chair)?: A Little Help needed to walk in hospital room?: A Little Help needed climbing 3-5 steps with a railing? : A Little 6 Click Score: 18    End of Session Equipment Utilized During Treatment: Gait belt Activity Tolerance: Patient tolerated treatment well Patient left: with chair alarm set;with call bell/phone within reach;in chair;with SCD's reapplied(OT in room) Nurse Communication: Mobility status PT Visit Diagnosis: Muscle weakness (generalized) (M62.81);Difficulty in walking, not elsewhere classified (R26.2);Pain Pain - Right/Left: Left Pain - part of body: Hip     Time: [id number] PT Time Calculation (min) (ACUTE ONLY): 28 min  Charges:  $Gait Training: 8-22 mins $Therapeutic Exercise: 8-22 mins                     Lieutenant Diego PT, DPT 9:24 AM,10/05/18 [phone]

## 2018-10-05 NOTE — TOC Initial Note (Signed)
Transition of Care [facility]) - Initial/Assessment Note    Patient Details  Name: Matthew Randolph MRN: [id number] Date of Birth: 1940-06-27  Transition of Care [facility]) CM/SW Contact:    Su Hilt, RN Phone Number: 10/05/2018, 1:11 PM  Clinical Narrative:                 Met with the patient to discuss DC plan and needs He lives at home with his wife He has used [facility] in the past for PT and would like to again,  [facility] has accepted the patient He has all of the DME needed at home  He has transportation with his spouse \He is up to date with PCP He can afford his medicaions  Expected Discharge Plan: [place] Barriers to Discharge: Continued Medical Work up   Patient Goals and CMS Choice Patient states their goals for this hospitalization and ongoing recovery are:: go home CMS Medicare.gov Compare Post Acute Care list provided to:: Patient Choice offered to / list presented to : Patient  Expected Discharge Plan and Services Expected Discharge Plan: [place]   Discharge Planning Services: CM Consult Post Acute Care Choice: [place] arrangements for the past 2 months: Single Family Home Expected Discharge Date: 10/06/18               DME Arranged: N/A         HH Arranged: PT HH Agency: [place] ([place]) Date [employer]: 10/05/18 Time HH Agency Contacted: 1210 Representative spoke with at [place]: Corene Cornea  Prior Living Arrangements/Services Living arrangements for the past 2 months: [place] Lives with:: Spouse Patient language and need for interpreter reviewed:: Yes Do you feel safe going back to the place where you live?: Yes      Need for Family Participation in Patient Care: No (Comment) Care giver support system in place?: Yes (comment) Current home services: DME(Grab bars, shower seat, RW, BSC, [employer]) Criminal Activity/Legal Involvement Pertinent to Current Situation/Hospitalization:  No - Comment as needed  Activities of Daily Living Home Assistive Devices/Equipment: Cane (specify quad or straight), Walker (specify type) ADL Screening (condition at time of admission) Patient's cognitive ability adequate to safely complete daily activities?: Yes Is the patient deaf or have difficulty hearing?: No Does the patient have difficulty seeing, even when wearing glasses/contacts?: No Does the patient have difficulty concentrating, remembering, or making decisions?: No Patient able to express need for assistance with ADLs?: Yes Does the patient have difficulty dressing or bathing?: Yes Independently performs ADLs?: Yes (appropriate for developmental age) Does the patient have difficulty walking or climbing stairs?: Yes Weakness of Legs: Left Weakness of Arms/Hands: None  Permission Sought/Granted   Permission granted to share information with : Yes, Verbal Permission Granted              Emotional Assessment Appearance:: Appears stated age Attitude/Demeanor/Rapport: Engaged Affect (typically observed): Appropriate Orientation: : Oriented to Self, Oriented to Place, Oriented to  Time, Oriented to Situation Alcohol / Substance Use: Not Applicable Psych Involvement: No (comment)  Admission diagnosis:  PRIMARY OSTEOARTHRITIS OF LEFT HIP Patient Active Problem List   Diagnosis Date Noted  . Status post total hip replacement, left 10/04/2018  . Pituitary adenoma ([place]) 04/20/2018  . Status post total hip replacement, right 09/09/2017  . Elevated PSA 08/19/2016  . Advance care planning 08/19/2016  . Anemia 08/19/2016  . GERD (gastroesophageal reflux disease) 08/19/2016  . Osteoarthritis of left  hip 02/17/2016  . Primary osteoarthritis of left knee 02/14/2016  . Hip pain 01/23/2015  . Strain of psoas muscle 09/26/2014  . Tobacco abuse 04/19/2014  . Benign essential HTN 04/19/2014  . ED (erectile dysfunction) 04/19/2014  . CKD (chronic kidney disease) 04/19/2014  .  High risk sexual behavior 04/19/2014  . Hyperlipemia 04/19/2014   PCP:  Volney American, PA-C Pharmacy:   [id number] [place]Phone: [phone]: [phone] [address]Phone: [phone]: [phone]    Social Determinants of Health (SDOH) Interventions    Readmission Risk Interventions Readmission Risk Prevention Plan 09/11/2017  Post Dischage Appt Complete  Medication Screening Complete  Transportation Screening Complete  PCP follow-up Complete  Some recent data might be hidden

## 2018-10-05 NOTE — Progress Notes (Signed)
   Subjective: 1 Day Post-Op Procedure(s) (LRB): TOTAL HIP ARTHROPLASTY ANTERIOR APPROACH (Left) EXCISION LIPOMA LEFT HIP (Left) Patient reports pain as moderate.   Patient is well, and has had no acute complaints or problems Denies any CP, SOB, ABD pain. We will continue therapy today.  Plan is to go Home after hospital stay.  Objective: Vital signs in last 24 hours: Temp:  [97 F (36.1 C)-99.5 F (37.5 C)] 99.5 F (37.5 C) (09/30 0757) Pulse Rate:  [43-86] 69 (09/30 0757) Resp:  [8-19] 18 (09/30 0356) BP: (87-163)/(49-80) 124/67 (09/30 0757) SpO2:  [97 %-100 %] 98 % (09/30 0757) Weight:  [96.4 kg] 96.4 kg (09/29 0945)  Intake/Output from previous day: 09/29 0701 - 09/30 0700 In: 2584.1 [I.V.:2284.1; IV Piggyback:300] Out: 2275 [Urine:2075; Blood:200] Intake/Output this shift: No intake/output data recorded.  Recent Labs    10/04/18 1611 10/05/18 0551  HGB 11.1* 10.2*   Recent Labs    10/04/18 1611 10/05/18 0551  WBC 10.1 6.6  RBC 4.00* 3.71*  HCT 36.6* 32.9*  PLT 244 245   Recent Labs    10/04/18 1611 10/05/18 0551  NA  --  139  K  --  4.4  CL  --  105  CO2  --  24  BUN  --  13  CREATININE 1.01 1.10  GLUCOSE  --  133*  CALCIUM  --  8.5*   No results for input(s): LABPT, INR in the last 72 hours.  EXAM General - Patient is Alert, Appropriate and Oriented Extremity - Neurovascular intact Sensation intact distally Intact pulses distally Dorsiflexion/Plantar flexion intact No cellulitis present Compartment soft Dressing - dressing C/D/I and no drainage,prevena intact with out drainage Motor Function - intact, moving foot and toes well on exam.   Past Medical History:  Diagnosis Date  . Arthritis   . Benign tumor of pituitary gland ([place])   . Chronic kidney disease   . ED (erectile dysfunction)   . Erectile dysfunction   . GERD (gastroesophageal reflux disease)   . High risk sexual behavior   . Hyperlipidemia   . Hypertension   . Pneumonia  2012  . Tobacco use disorder, continuous     Assessment/Plan:   1 Day Post-Op Procedure(s) (LRB): TOTAL HIP ARTHROPLASTY ANTERIOR APPROACH (Left) EXCISION LIPOMA LEFT HIP (Left) Active Problems:   Status post total hip replacement, left   Acute post op blood loss anemia   Estimated body mass index is 28.82 kg/m as calculated from the following:   Height as of this encounter: 6' (1.829 m).   Weight as of this encounter: 96.4 kg. Advance diet Up with therapy  Needs BM Labs stable, Hgb 10.2. Recheck labs in the am VSS, encouraged incentive spirometer Pain controlled CM to assist with discharge  DVT Prophylaxis - Lovenox, TED hose and SCDs Weight-Bearing as tolerated to left leg   T. Rachelle Hora, PA-C [place] 10/05/2018, 8:05 AM

## 2018-10-05 NOTE — Discharge Instructions (Signed)
ANTERIOR APPROACH TOTAL HIP REPLACEMENT POSTOPERATIVE DIRECTIONS   Hip Rehabilitation, Guidelines Following Surgery  The results of a hip operation are greatly improved after range of motion and muscle strengthening exercises. Follow all safety measures which are given to protect your hip. If any of these exercises cause increased pain or swelling in your joint, decrease the amount until you are comfortable again. Then slowly increase the exercises. Call your caregiver if you have problems or questions.   HOME CARE INSTRUCTIONS  Remove items at home which could result in a fall. This includes throw rugs or furniture in walking pathways.   ICE to the affected hip every three hours for 30 minutes at a time and then as needed for pain and swelling.  Continue to use ice on the hip for pain and swelling from surgery. You may notice swelling that will progress down to the foot and ankle.  This is normal after surgery.  Elevate the leg when you are not up walking on it.    Continue to use the breathing machine which will help keep your temperature down.  It is common for your temperature to cycle up and down following surgery, especially at night when you are not up moving around and exerting yourself.  The breathing machine keeps your lungs expanded and your temperature down.  Do not place pillow under knee, focus on keeping the knee straight while resting  DIET You may resume your previous home diet once your are discharged from the hospital.  DRESSING / WOUND CARE / SHOWERING Please remove provena negative pressure dressing on 10/13/2018 and apply honey comb dressing. Keep dressing clean and dry at all times.   ACTIVITY Walk with your walker as instructed. Use walker as long as suggested by your caregivers. Avoid periods of inactivity such as sitting longer than an hour when not asleep. This helps prevent blood clots.  You may resume a sexual relationship in one month or when given the OK by  your doctor.  You may return to work once you are cleared by your doctor.  Do not drive a car for 6 weeks or until released by you surgeon.  Do not drive while taking narcotics.  WEIGHT BEARING Weight bearing as tolerated. Use walker/cane as needed for at least 4 weeks post op.  POSTOPERATIVE CONSTIPATION PROTOCOL Constipation - defined medically as fewer than three stools per week and severe constipation as less than one stool per week.  One of the most common issues patients have following surgery is constipation.  Even if you have a regular bowel pattern at home, your normal regimen is likely to be disrupted due to multiple reasons following surgery.  Combination of anesthesia, postoperative narcotics, change in appetite and fluid intake all can affect your bowels.  In order to avoid complications following surgery, here are some recommendations in order to help you during your recovery period.  Colace (docusate) - Pick up an over-the-counter form of Colace or another stool softener and take twice a day as long as you are requiring postoperative pain medications.  Take with a full glass of water daily.  If you experience loose stools or diarrhea, hold the colace until you stool forms back up.  If your symptoms do not get better within 1 week or if they get worse, check with your doctor.  Dulcolax (bisacodyl) - Pick up over-the-counter and take as directed by the product packaging as needed to assist with the movement of your bowels.  Take with a  full glass of water.  Use this product as needed if not relieved by Colace only.  ° °MiraLax (polyethylene glycol) - Pick up over-the-counter to have on hand.  MiraLax is a solution that will increase the amount of water in your bowels to assist with bowel movements.  Take as directed and can mix with a glass of water, juice, soda, coffee, or tea.  Take if you go more than two days without a movement. °Do not use MiraLax more than once per day. Call your  doctor if you are still constipated or irregular after using this medication for 7 days in a row. ° °If you continue to have problems with postoperative constipation, please contact the office for further assistance and recommendations.  If you experience "the worst abdominal pain ever" or develop nausea or vomiting, please contact the office immediatly for further recommendations for treatment. ° °ITCHING ° If you experience itching with your medications, try taking only a single pain pill, or even half a pain pill at a time.  You can also use Benadryl over the counter for itching or also to help with sleep.  ° °TED HOSE STOCKINGS °Wear the elastic stockings on both legs for six weeks following surgery during the day but you may remove then at night for sleeping. ° °MEDICATIONS °See your medication summary on the “After Visit Summary” that the nursing staff will review with you prior to discharge.  You may have some home medications which will be placed on hold until you complete the course of blood thinner medication.  It is important for you to complete the blood thinner medication as prescribed by your surgeon.  Continue your approved medications as instructed at time of discharge. ° °PRECAUTIONS °If you experience chest pain or shortness of breath - call 911 immediately for transfer to the hospital emergency department.  °If you develop a fever greater that 101 F, purulent drainage from wound, increased redness or drainage from wound, foul odor from the wound/dressing, or calf pain - CONTACT YOUR SURGEON.   °                                                °FOLLOW-UP APPOINTMENTS °Make sure you keep all of your appointments after your operation with your surgeon and caregivers. You should call the office at the above phone number and make an appointment for approximately two weeks after the date of your surgery or on the date instructed by your surgeon outlined in the "After Visit Summary". ° °RANGE OF MOTION  AND STRENGTHENING EXERCISES  °These exercises are designed to help you keep full movement of your hip joint. Follow your caregiver's or physical therapist's instructions. Perform all exercises about fifteen times, three times per day or as directed. Exercise both hips, even if you have had only one joint replacement. These exercises can be done on a training (exercise) mat, on the floor, on a table or on a bed. Use whatever works the best and is most comfortable for you. Use music or television while you are exercising so that the exercises are a pleasant break in your day. This will make your life better with the exercises acting as a break in routine you can look forward to.  °Lying on your back, slowly slide your foot toward your buttocks, raising your knee up off the floor. Then   slowly slide your foot back down until your leg is straight again.  °Lying on your back spread your legs as far apart as you can without causing discomfort.  °Lying on your side, raise your upper leg and foot straight up from the floor as far as is comfortable. Slowly lower the leg and repeat.  °Lying on your back, tighten up the muscle in the front of your thigh (quadriceps muscles). You can do this by keeping your leg straight and trying to raise your heel off the floor. This helps strengthen the largest muscle supporting your knee.  °Lying on your back, tighten up the muscles of your buttocks both with the legs straight and with the knee bent at a comfortable angle while keeping your heel on the floor.  ° °IF YOU ARE TRANSFERRED TO A SKILLED REHAB FACILITY °If the patient is transferred to a skilled rehab facility following release from the hospital, a list of the current medications will be sent to the facility for the patient to continue.  When discharged from the skilled rehab facility, please have the facility set up the patient's Home Health Physical Therapy prior to being released. Also, the skilled facility will be responsible  for providing the patient with their medications at time of release from the facility to include their pain medication, the muscle relaxants, and their blood thinner medication. If the patient is still at the rehab facility at time of the two week follow up appointment, the skilled rehab facility will also need to assist the patient in arranging follow up appointment in our office and any transportation needs. ° °MAKE SURE YOU:  °Understand these instructions.  °Get help right away if you are not doing well or get worse.  ° ° °Pick up stool softner and laxative for home use following surgery while on pain medications. °Continue to use ice for pain and swelling after surgery. °Do not use any lotions or creams on the incision until instructed by your surgeon. ° °

## 2018-10-06 LAB — BASIC METABOLIC PANEL
Anion gap: 9 (ref 5–15)
BUN: 14 mg/dL (ref 8–23)
CO2: 26 mmol/L (ref 22–32)
Calcium: 8.6 mg/dL — ABNORMAL LOW (ref 8.9–10.3)
Chloride: 101 mmol/L (ref 98–111)
Creatinine, Ser: 1.08 mg/dL (ref 0.61–1.24)
GFR calc Af Amer: >60 mL/min (ref >60)
GFR calc non Af Amer: >60 mL/min (ref >60)
Glucose, Bld: 116 mg/dL — ABNORMAL HIGH (ref 70–99)
Potassium: 4.3 mmol/L (ref 3.5–5.1)
Sodium: 136 mmol/L (ref 135–145)

## 2018-10-06 LAB — CBC
HCT: 32.6 % — ABNORMAL LOW (ref 39.0–52.0)
Hemoglobin: 10.1 g/dL — ABNORMAL LOW (ref 13.0–17.0)
MCH: 27.5 pg (ref 26.0–34.0)
MCHC: 31 g/dL (ref 30.0–36.0)
MCV: 88.8 fL (ref 80.0–100.0)
Platelets: 232 10*3/uL (ref 150–400)
RBC: 3.67 MIL/uL — ABNORMAL LOW (ref 4.22–5.81)
RDW: 13 % (ref 11.5–15.5)
WBC: 9.9 10*3/uL (ref 4.0–10.5)
nRBC: 0 % (ref 0.0–0.2)

## 2018-10-06 NOTE — TOC Progression Note (Signed)
Transition of Care [facility]) - Progression Note    Patient Details  Name: Matthew Randolph MRN: [id number] Date of Birth: December 18, 1940  [facility] CM/SW [place], RN Phone Number: 10/06/2018, 9:46 AM  Clinical Narrative:     Called [facility] and made referral for the patient to get into the program, She stated that they do have a wait list but they will call the patient and do assessment and get him into the program if he qualifies  Expected Discharge Plan: [place] Barriers to Discharge: Continued Medical Work up  Expected Discharge Plan and Services Expected Discharge Plan: [place]   Discharge Planning Services: CM Consult Post Acute Care Choice: [place] arrangements for the past 2 months: Single Family Home Expected Discharge Date: 10/06/18               DME Arranged: N/A         HH Arranged: PT HH Agency: [place] ([place]) Date HH Agency Contacted: 10/05/18 Time HH Agency Contacted: 1210 Representative spoke with at [place]: [place] ([place]) Interventions    Readmission Risk Interventions Readmission Risk Prevention Plan 09/11/2017  Post Dischage Appt Complete  Medication Screening Complete  Transportation Screening Complete  PCP follow-up Complete  Some recent data might be hidden

## 2018-10-06 NOTE — Progress Notes (Signed)
Discharge instructions given to pt. He verbalized understanding of discharge instructions. Discharge packet and extra dressings given to patient. Pt is being discharged with a prevena woundvac. Personal belongings given to patient

## 2018-10-06 NOTE — Progress Notes (Signed)
Physical Therapy Treatment Patient Details Name: Matthew Randolph MRN: [id number] DOB: 1940-07-22 Today's Date: 10/06/2018    History of Present Illness 78 y/o male s/p L total hip replacement (anterior approach), 10/04/18.    PT Comments    Received suppository prior with no results.  Ready to walk in hopes of stimulating BM. Stood and walked around unit x 1 with rw and min guard.  No lob or buckling.  Remained in recliner after gait.   Follow Up Recommendations  Home health PT     Equipment Recommendations  None recommended by PT    Recommendations for Other Services       Precautions / Restrictions Precautions Precautions: Anterior Hip;Fall Precaution Booklet Issued: Yes (comment) Restrictions Weight Bearing Restrictions: Yes LLE Weight Bearing: Weight bearing as tolerated    Mobility  Bed Mobility Overal bed mobility: Needs Assistance Bed Mobility: Supine to Sit     Supine to sit: Min assist;HOB elevated     General bed mobility comments: Deferred. Pt up in recliner at startend of session.  Transfers Overall transfer level: Needs assistance Equipment used: Rolling walker (2 wheeled) Transfers: Sit to/from Stand Sit to Stand: Min assist;Min guard         General transfer comment: . heavy use of UE from low chair  Ambulation/Gait Ambulation/Gait assistance: Supervision;Min guard Gait Distance (Feet): 200 Feet Assistive device: Rolling walker (2 wheeled) Gait Pattern/deviations: Step-through pattern;Decreased stance time - left Gait velocity: decreased       Stairs Stairs: Yes Stairs assistance: Min guard Stair Management: With walker;No rails Number of Stairs: 2 General stair comments: no LOB today and good technique   Wheelchair Mobility    Modified Rankin (Stroke Patients Only)       Balance Overall balance assessment: Needs assistance Sitting-balance support: Feet supported;No upper extremity supported Sitting balance-Leahy Scale:  Good Sitting balance - Comments: Steady sitting, reaching within BOS. Good static and dynamic seated balance.   Standing balance support: During functional activity Standing balance-Leahy Scale: Fair                              Cognition Arousal/Alertness: Awake/alert Behavior During Therapy: WFL for tasks assessed/performed Overall Cognitive Status: Within Functional Limits for tasks assessed                                 General Comments: may be slightly [facility]      Exercises Total Joint Exercises Quad Sets: Strengthening;10 reps;AROM;Supine;Left Short Arc Quad: Strengthening;10 reps;AROM;Left Heel Slides: AAROM;Left;10 reps;Supine Straight Leg Raises: AAROM;Strengthening;Left;10 reps    General Comments        Pertinent Vitals/Pain Pain Assessment: 0-10 Pain Score: 3  Pain Location: L hip Pain Descriptors / Indicators: Sore;Guarding;Discomfort;Grimacing Pain Intervention(s): Limited activity within patient's tolerance;Monitored during session    Home Living                      Prior Function            PT Goals (current goals can now be found in the care plan section) Progress towards PT goals: Progressing toward goals    Frequency    BID      PT Plan Current plan remains appropriate    Co-evaluation              AM-PAC PT "6 Clicks" Mobility   Outcome  Measure  Help needed turning from your back to your side while in a flat bed without using bedrails?: A Little Help needed moving from lying on your back to sitting on the side of a flat bed without using bedrails?: A Little Help needed moving to and from a bed to a chair (including a wheelchair)?: A Little Help needed standing up from a chair using your arms (e.g., wheelchair or bedside chair)?: A Little Help needed to walk in hospital room?: A Little Help needed climbing 3-5 steps with a railing? : A Little 6 Click Score: 18    End of Session Equipment  Utilized During Treatment: Gait belt Activity Tolerance: Patient tolerated treatment well Patient left: with chair alarm set;with call bell/phone within reach;in chair;with SCD's reapplied Nurse Communication: Mobility status PT Visit Diagnosis: Muscle weakness (generalized) (M62.81);Difficulty in walking, not elsewhere classified (R26.2);Pain Pain - Right/Left: Left Pain - part of body: Hip     Time: [id number] PT Time Calculation (min) (ACUTE ONLY): 14 min  Charges:  $Gait Training: 8-22 mins $Therapeutic Exercise: 8-22 mins                     Chesley Noon, PTA 10/06/18, 12:11 PM

## 2018-10-06 NOTE — Progress Notes (Signed)
   Subjective: 2 Days Post-Op Procedure(s) (LRB): TOTAL HIP ARTHROPLASTY ANTERIOR APPROACH (Left) EXCISION LIPOMA LEFT HIP (Left) Patient reports pain as mild.   Patient is well, and has had no acute complaints or problems Denies any CP, SOB, ABD pain. We will continue therapy today.  Plan is to go Home after hospital stay.  Objective: Vital signs in last 24 hours: Temp:  [97.9 F (36.6 C)-99.4 F (37.4 C)] 97.9 F (36.6 C) (10/01 0729) Pulse Rate:  [68-77] 71 (10/01 0729) Resp:  [18] 18 (10/01 0729) BP: (115-136)/(57-70) 136/57 (10/01 0729) SpO2:  [95 %-100 %] 99 % (10/01 0729)  Intake/Output from previous day: 09/30 0701 - 10/01 0700 In: 849.9 [P.O.:240; I.V.:609.9] Out: 1000 [Urine:1000] Intake/Output this shift: No intake/output data recorded.  Recent Labs    10/04/18 1611 10/05/18 0551 10/06/18 0609  HGB 11.1* 10.2* 10.1*   Recent Labs    10/05/18 0551 10/06/18 0609  WBC 6.6 9.9  RBC 3.71* 3.67*  HCT 32.9* 32.6*  PLT 245 232   Recent Labs    10/05/18 0551 10/06/18 0609  NA 139 136  K 4.4 4.3  CL 105 101  CO2 24 26  BUN 13 14  CREATININE 1.10 1.08  GLUCOSE 133* 116*  CALCIUM 8.5* 8.6*   No results for input(s): LABPT, INR in the last 72 hours.  EXAM General - Patient is Alert, Appropriate and Oriented Extremity - Neurovascular intact Sensation intact distally Intact pulses distally Dorsiflexion/Plantar flexion intact No cellulitis present Compartment soft Dressing - dressing C/D/I and no drainage,prevena intact with out drainage Motor Function - intact, moving foot and toes well on exam.   Past Medical History:  Diagnosis Date  . Arthritis   . Benign tumor of pituitary gland ([place])   . Chronic kidney disease   . ED (erectile dysfunction)   . Erectile dysfunction   . GERD (gastroesophageal reflux disease)   . High risk sexual behavior   . Hyperlipidemia   . Hypertension   . Pneumonia 2012  . Tobacco use disorder, continuous      Assessment/Plan:   2 Days Post-Op Procedure(s) (LRB): TOTAL HIP ARTHROPLASTY ANTERIOR APPROACH (Left) EXCISION LIPOMA LEFT HIP (Left) Active Problems:   Status post total hip replacement, left   Acute post op blood loss anemia   Estimated body mass index is 28.82 kg/m as calculated from the following:   Height as of this encounter: 6' (1.829 m).   Weight as of this encounter: 96.4 kg. Advance diet Up with therapy  Needs BM Labs stable, Hgb 10.1.  VSS, encouraged incentive spirometer Pain controlled CM to assist with discharge to home with home health PT today pending bowel movement and completion of PT goals.  DVT Prophylaxis - Lovenox, TED hose and SCDs Weight-Bearing as tolerated to left leg   T. Rachelle Hora, PA-C [place] 10/06/2018, 8:29 AMv

## 2018-10-06 NOTE — Progress Notes (Signed)
Pt wheeled to car in ortho wheelchair to personal vehicle accompanied by girlfriend, transferred to personal vehicle without difficulty , Hard rx and medication regimen explained to pt without questions

## 2018-10-06 NOTE — Progress Notes (Signed)
Physical Therapy Treatment Patient Details Name: Matthew Randolph MRN: [id number] DOB: 1940/01/25 Today's Date: 10/06/2018    History of Present Illness 78 y/o male s/p L total hip replacement (anterior approach), 10/04/18.    PT Comments    Participated in exercises as described below.  Out of bed with min a x 1.  Stood and was able to walk to/from rehab gym with RW and min guard.  Seated rest on mat prior to stair training.  Pt able to recall sequencing and no LOB noted today on stairs.   Remained in recliner after session.     Follow Up Recommendations  Home health PT     Equipment Recommendations  None recommended by PT    Recommendations for Other Services       Precautions / Restrictions Precautions Precautions: Anterior Hip;Fall Precaution Booklet Issued: Yes (comment) Restrictions Weight Bearing Restrictions: Yes LLE Weight Bearing: Weight bearing as tolerated    Mobility  Bed Mobility Overal bed mobility: Needs Assistance Bed Mobility: Supine to Sit     Supine to sit: Min assist;HOB elevated        Transfers Overall transfer level: Needs assistance Equipment used: Rolling walker (2 wheeled) Transfers: Sit to/from Stand Sit to Stand: Min assist;Min guard            Ambulation/Gait Ambulation/Gait assistance: Supervision Gait Distance (Feet): 220 Feet Assistive device: Rolling walker (2 wheeled) Gait Pattern/deviations: Step-through pattern Gait velocity: decreased       Stairs Stairs: Yes Stairs assistance: Min guard Stair Management: With walker;No rails Number of Stairs: 2 General stair comments: no LOB today and good technique   Wheelchair Mobility    Modified Rankin (Stroke Patients Only)       Balance Overall balance assessment: Needs assistance Sitting-balance support: Feet supported;No upper extremity supported Sitting balance-Leahy Scale: Good Sitting balance - Comments: Steady sitting, reaching within BOS. Good static  and dynamic seated balance.   Standing balance support: During functional activity Standing balance-Leahy Scale: Fair                              Cognition Arousal/Alertness: Awake/alert Behavior During Therapy: WFL for tasks assessed/performed Overall Cognitive Status: Within Functional Limits for tasks assessed                                 General Comments: may be slightly [facility]      Exercises Total Joint Exercises Quad Sets: Strengthening;10 reps;AROM;Supine;Left Short Arc Quad: Strengthening;10 reps;AROM;Left Heel Slides: AAROM;Left;10 reps;Supine Straight Leg Raises: AAROM;Strengthening;Left;10 reps    General Comments        Pertinent Vitals/Pain Pain Assessment: 0-10 Pain Score: 4  Pain Location: L hip Pain Intervention(s): Limited activity within patient's tolerance;Monitored during session    Home Living                      Prior Function            PT Goals (current goals can now be found in the care plan section) Progress towards PT goals: Progressing toward goals    Frequency    BID      PT Plan Current plan remains appropriate    Co-evaluation              AM-PAC PT "6 Clicks" Mobility   Outcome Measure  Help needed turning from your  back to your side while in a flat bed without using bedrails?: A Little Help needed moving from lying on your back to sitting on the side of a flat bed without using bedrails?: A Little Help needed moving to and from a bed to a chair (including a wheelchair)?: A Little Help needed standing up from a chair using your arms (e.g., wheelchair or bedside chair)?: A Little Help needed to walk in hospital room?: A Little Help needed climbing 3-5 steps with a railing? : A Little 6 Click Score: 18    End of Session Equipment Utilized During Treatment: Gait belt Activity Tolerance: Patient tolerated treatment well Patient left: with chair alarm set;with call bell/phone  within reach;in chair;with SCD's reapplied Nurse Communication: Mobility status PT Visit Diagnosis: Muscle weakness (generalized) (M62.81);Difficulty in walking, not elsewhere classified (R26.2);Pain Pain - Right/Left: Left Pain - part of body: Hip     Time: [id number] PT Time Calculation (min) (ACUTE ONLY): 32 min  Charges:  $Gait Training: 8-22 mins $Therapeutic Exercise: 8-22 mins                     Chesley Noon, PTA 10/06/18, 11:09 AM

## 2018-10-07 ENCOUNTER — Ambulatory Visit: Payer: Self-pay | Admitting: Licensed Clinical Social Worker

## 2018-10-07 DIAGNOSIS — K219 Gastro-esophageal reflux disease without esophagitis: Secondary | ICD-10-CM | POA: Diagnosis not present

## 2018-10-07 DIAGNOSIS — Z471 Aftercare following joint replacement surgery: Secondary | ICD-10-CM | POA: Diagnosis not present

## 2018-10-07 DIAGNOSIS — N189 Chronic kidney disease, unspecified: Secondary | ICD-10-CM | POA: Diagnosis not present

## 2018-10-07 DIAGNOSIS — Z96641 Presence of right artificial hip joint: Secondary | ICD-10-CM | POA: Diagnosis not present

## 2018-10-07 DIAGNOSIS — E785 Hyperlipidemia, unspecified: Secondary | ICD-10-CM | POA: Diagnosis not present

## 2018-10-07 DIAGNOSIS — D631 Anemia in chronic kidney disease: Secondary | ICD-10-CM | POA: Diagnosis not present

## 2018-10-07 DIAGNOSIS — M1712 Unilateral primary osteoarthritis, left knee: Secondary | ICD-10-CM | POA: Diagnosis not present

## 2018-10-07 DIAGNOSIS — I129 Hypertensive chronic kidney disease with stage 1 through stage 4 chronic kidney disease, or unspecified chronic kidney disease: Secondary | ICD-10-CM | POA: Diagnosis not present

## 2018-10-07 DIAGNOSIS — Z96642 Presence of left artificial hip joint: Secondary | ICD-10-CM | POA: Diagnosis not present

## 2018-10-07 DIAGNOSIS — R69 Illness, unspecified: Secondary | ICD-10-CM | POA: Diagnosis not present

## 2018-10-07 NOTE — Chronic Care Management (AMB) (Signed)
  Care Management   Follow Up Note   10/07/2018 Name: Matthew Randolph MRN: [id number] DOB: 04/23/1940  Referred by: Volney American, PA-C Reason for referral : Care Coordination   Matthew Randolph is a 78 y.o. year old male who is a primary care patient of Volney American, [place]. The care management team was consulted for assistance with care management and care coordination needs.    Review of patient status, including review of consultants reports, relevant laboratory and other test results, and collaboration with appropriate care team members and the patient's provider was performed as part of comprehensive patient evaluation and provision of chronic care management services.   Referral from [facility] inpatient staff for Mobile Meals. LCSW completed referral to [place] on 10/07/2018 and updated CCM RNCM.   The care management team will reach out to the patient again over the next 30 days.   Eula Fried, BSW, MSW, [facility] Care Management [email or web address] Phone: [phone]

## 2018-10-11 DIAGNOSIS — Z96641 Presence of right artificial hip joint: Secondary | ICD-10-CM | POA: Diagnosis not present

## 2018-10-11 DIAGNOSIS — N189 Chronic kidney disease, unspecified: Secondary | ICD-10-CM | POA: Diagnosis not present

## 2018-10-11 DIAGNOSIS — R69 Illness, unspecified: Secondary | ICD-10-CM | POA: Diagnosis not present

## 2018-10-11 DIAGNOSIS — Z471 Aftercare following joint replacement surgery: Secondary | ICD-10-CM | POA: Diagnosis not present

## 2018-10-11 DIAGNOSIS — D631 Anemia in chronic kidney disease: Secondary | ICD-10-CM | POA: Diagnosis not present

## 2018-10-11 DIAGNOSIS — Z96642 Presence of left artificial hip joint: Secondary | ICD-10-CM | POA: Diagnosis not present

## 2018-10-11 DIAGNOSIS — E785 Hyperlipidemia, unspecified: Secondary | ICD-10-CM | POA: Diagnosis not present

## 2018-10-11 DIAGNOSIS — K219 Gastro-esophageal reflux disease without esophagitis: Secondary | ICD-10-CM | POA: Diagnosis not present

## 2018-10-11 DIAGNOSIS — I129 Hypertensive chronic kidney disease with stage 1 through stage 4 chronic kidney disease, or unspecified chronic kidney disease: Secondary | ICD-10-CM | POA: Diagnosis not present

## 2018-10-11 DIAGNOSIS — M1712 Unilateral primary osteoarthritis, left knee: Secondary | ICD-10-CM | POA: Diagnosis not present

## 2018-10-13 ENCOUNTER — Other Ambulatory Visit: Payer: Self-pay | Admitting: Family Medicine

## 2018-10-13 DIAGNOSIS — I129 Hypertensive chronic kidney disease with stage 1 through stage 4 chronic kidney disease, or unspecified chronic kidney disease: Secondary | ICD-10-CM | POA: Diagnosis not present

## 2018-10-13 DIAGNOSIS — Z96641 Presence of right artificial hip joint: Secondary | ICD-10-CM | POA: Diagnosis not present

## 2018-10-13 DIAGNOSIS — Z471 Aftercare following joint replacement surgery: Secondary | ICD-10-CM | POA: Diagnosis not present

## 2018-10-13 DIAGNOSIS — M1712 Unilateral primary osteoarthritis, left knee: Secondary | ICD-10-CM | POA: Diagnosis not present

## 2018-10-13 DIAGNOSIS — N189 Chronic kidney disease, unspecified: Secondary | ICD-10-CM | POA: Diagnosis not present

## 2018-10-13 DIAGNOSIS — D631 Anemia in chronic kidney disease: Secondary | ICD-10-CM | POA: Diagnosis not present

## 2018-10-13 DIAGNOSIS — Z96642 Presence of left artificial hip joint: Secondary | ICD-10-CM | POA: Diagnosis not present

## 2018-10-13 DIAGNOSIS — E785 Hyperlipidemia, unspecified: Secondary | ICD-10-CM | POA: Diagnosis not present

## 2018-10-13 DIAGNOSIS — K219 Gastro-esophageal reflux disease without esophagitis: Secondary | ICD-10-CM | POA: Diagnosis not present

## 2018-10-13 DIAGNOSIS — R69 Illness, unspecified: Secondary | ICD-10-CM | POA: Diagnosis not present

## 2018-10-17 DIAGNOSIS — Z96641 Presence of right artificial hip joint: Secondary | ICD-10-CM | POA: Diagnosis not present

## 2018-10-17 DIAGNOSIS — I129 Hypertensive chronic kidney disease with stage 1 through stage 4 chronic kidney disease, or unspecified chronic kidney disease: Secondary | ICD-10-CM | POA: Diagnosis not present

## 2018-10-17 DIAGNOSIS — E785 Hyperlipidemia, unspecified: Secondary | ICD-10-CM | POA: Diagnosis not present

## 2018-10-17 DIAGNOSIS — N189 Chronic kidney disease, unspecified: Secondary | ICD-10-CM | POA: Diagnosis not present

## 2018-10-17 DIAGNOSIS — K219 Gastro-esophageal reflux disease without esophagitis: Secondary | ICD-10-CM | POA: Diagnosis not present

## 2018-10-17 DIAGNOSIS — Z471 Aftercare following joint replacement surgery: Secondary | ICD-10-CM | POA: Diagnosis not present

## 2018-10-17 DIAGNOSIS — D631 Anemia in chronic kidney disease: Secondary | ICD-10-CM | POA: Diagnosis not present

## 2018-10-17 DIAGNOSIS — R69 Illness, unspecified: Secondary | ICD-10-CM | POA: Diagnosis not present

## 2018-10-17 DIAGNOSIS — M1712 Unilateral primary osteoarthritis, left knee: Secondary | ICD-10-CM | POA: Diagnosis not present

## 2018-10-17 DIAGNOSIS — Z96642 Presence of left artificial hip joint: Secondary | ICD-10-CM | POA: Diagnosis not present

## 2018-10-18 ENCOUNTER — Other Ambulatory Visit: Payer: Self-pay

## 2018-10-18 ENCOUNTER — Ambulatory Visit: Payer: Self-pay | Admitting: Licensed Clinical Social Worker

## 2018-10-18 NOTE — Chronic Care Management (AMB) (Signed)
  Care Management   Follow Up Note   10/18/2018 Name: Matthew Randolph MRN: [id number] DOB: 08-Dec-1940  Referred by: Volney American, PA-C Reason for referral : Care Coordination   Matthew Randolph is a 78 y.o. year old male who is a primary care patient of Volney American, [place]. The care management team was consulted for assistance with care management and care coordination needs.    Review of patient status, including review of consultants reports, relevant laboratory and other test results, and collaboration with appropriate care team members and the patient's provider was performed as part of comprehensive patient evaluation and provision of chronic care management services.    SDOH (Social Determinants of Health) screening performed today: Food Insecurity .  LCSW received incoming message from [place] on 10/18/18 stating- "I received a call from [place] from [employer] on Wheels for the attached patient.  She was inquiring whether or not he was a [facility] Patient, and I saw you were both on his care team.  She was inquiring about his food security.  She asked if he needed to be a [facility] Referral for the MOW program. Her phone number is [phone].  If there is anything further you need from me, please let me know. Thank you!" LCSW send a secure email to [place] with MOW's stating that pt is a [facility] patient and we would like to utilize our contract at this time in order to get him food support.   The care management team will reach out to the patient again over the next 45 days.   Eula Fried, BSW, MSW, [facility] Care Management [email or web address] Phone: [phone]

## 2018-10-21 DIAGNOSIS — N189 Chronic kidney disease, unspecified: Secondary | ICD-10-CM | POA: Diagnosis not present

## 2018-10-21 DIAGNOSIS — Z96642 Presence of left artificial hip joint: Secondary | ICD-10-CM | POA: Diagnosis not present

## 2018-10-21 DIAGNOSIS — E785 Hyperlipidemia, unspecified: Secondary | ICD-10-CM | POA: Diagnosis not present

## 2018-10-21 DIAGNOSIS — K219 Gastro-esophageal reflux disease without esophagitis: Secondary | ICD-10-CM | POA: Diagnosis not present

## 2018-10-21 DIAGNOSIS — I129 Hypertensive chronic kidney disease with stage 1 through stage 4 chronic kidney disease, or unspecified chronic kidney disease: Secondary | ICD-10-CM | POA: Diagnosis not present

## 2018-10-21 DIAGNOSIS — Z471 Aftercare following joint replacement surgery: Secondary | ICD-10-CM | POA: Diagnosis not present

## 2018-10-21 DIAGNOSIS — Z96641 Presence of right artificial hip joint: Secondary | ICD-10-CM | POA: Diagnosis not present

## 2018-10-21 DIAGNOSIS — M1712 Unilateral primary osteoarthritis, left knee: Secondary | ICD-10-CM | POA: Diagnosis not present

## 2018-10-21 DIAGNOSIS — D631 Anemia in chronic kidney disease: Secondary | ICD-10-CM | POA: Diagnosis not present

## 2018-10-21 DIAGNOSIS — R69 Illness, unspecified: Secondary | ICD-10-CM | POA: Diagnosis not present

## 2018-10-24 DIAGNOSIS — D631 Anemia in chronic kidney disease: Secondary | ICD-10-CM | POA: Diagnosis not present

## 2018-10-24 DIAGNOSIS — N189 Chronic kidney disease, unspecified: Secondary | ICD-10-CM | POA: Diagnosis not present

## 2018-10-24 DIAGNOSIS — M1712 Unilateral primary osteoarthritis, left knee: Secondary | ICD-10-CM | POA: Diagnosis not present

## 2018-10-24 DIAGNOSIS — Z96641 Presence of right artificial hip joint: Secondary | ICD-10-CM | POA: Diagnosis not present

## 2018-10-24 DIAGNOSIS — Z471 Aftercare following joint replacement surgery: Secondary | ICD-10-CM | POA: Diagnosis not present

## 2018-10-24 DIAGNOSIS — R69 Illness, unspecified: Secondary | ICD-10-CM | POA: Diagnosis not present

## 2018-10-24 DIAGNOSIS — Z96642 Presence of left artificial hip joint: Secondary | ICD-10-CM | POA: Diagnosis not present

## 2018-10-24 DIAGNOSIS — K219 Gastro-esophageal reflux disease without esophagitis: Secondary | ICD-10-CM | POA: Diagnosis not present

## 2018-10-24 DIAGNOSIS — E785 Hyperlipidemia, unspecified: Secondary | ICD-10-CM | POA: Diagnosis not present

## 2018-10-24 DIAGNOSIS — I129 Hypertensive chronic kidney disease with stage 1 through stage 4 chronic kidney disease, or unspecified chronic kidney disease: Secondary | ICD-10-CM | POA: Diagnosis not present

## 2018-10-26 ENCOUNTER — Encounter: Payer: Medicare HMO | Admitting: Family Medicine

## 2018-10-26 DIAGNOSIS — D631 Anemia in chronic kidney disease: Secondary | ICD-10-CM | POA: Diagnosis not present

## 2018-10-26 DIAGNOSIS — N189 Chronic kidney disease, unspecified: Secondary | ICD-10-CM | POA: Diagnosis not present

## 2018-10-26 DIAGNOSIS — M1712 Unilateral primary osteoarthritis, left knee: Secondary | ICD-10-CM | POA: Diagnosis not present

## 2018-10-26 DIAGNOSIS — K219 Gastro-esophageal reflux disease without esophagitis: Secondary | ICD-10-CM | POA: Diagnosis not present

## 2018-10-26 DIAGNOSIS — E785 Hyperlipidemia, unspecified: Secondary | ICD-10-CM | POA: Diagnosis not present

## 2018-10-26 DIAGNOSIS — Z471 Aftercare following joint replacement surgery: Secondary | ICD-10-CM | POA: Diagnosis not present

## 2018-10-26 DIAGNOSIS — R69 Illness, unspecified: Secondary | ICD-10-CM | POA: Diagnosis not present

## 2018-10-26 DIAGNOSIS — I129 Hypertensive chronic kidney disease with stage 1 through stage 4 chronic kidney disease, or unspecified chronic kidney disease: Secondary | ICD-10-CM | POA: Diagnosis not present

## 2018-10-26 DIAGNOSIS — Z96641 Presence of right artificial hip joint: Secondary | ICD-10-CM | POA: Diagnosis not present

## 2018-10-26 DIAGNOSIS — Z96642 Presence of left artificial hip joint: Secondary | ICD-10-CM | POA: Diagnosis not present

## 2018-10-31 DIAGNOSIS — E785 Hyperlipidemia, unspecified: Secondary | ICD-10-CM | POA: Diagnosis not present

## 2018-10-31 DIAGNOSIS — N189 Chronic kidney disease, unspecified: Secondary | ICD-10-CM | POA: Diagnosis not present

## 2018-10-31 DIAGNOSIS — Z471 Aftercare following joint replacement surgery: Secondary | ICD-10-CM | POA: Diagnosis not present

## 2018-10-31 DIAGNOSIS — Z96642 Presence of left artificial hip joint: Secondary | ICD-10-CM | POA: Diagnosis not present

## 2018-10-31 DIAGNOSIS — R69 Illness, unspecified: Secondary | ICD-10-CM | POA: Diagnosis not present

## 2018-10-31 DIAGNOSIS — M1712 Unilateral primary osteoarthritis, left knee: Secondary | ICD-10-CM | POA: Diagnosis not present

## 2018-10-31 DIAGNOSIS — K219 Gastro-esophageal reflux disease without esophagitis: Secondary | ICD-10-CM | POA: Diagnosis not present

## 2018-10-31 DIAGNOSIS — I129 Hypertensive chronic kidney disease with stage 1 through stage 4 chronic kidney disease, or unspecified chronic kidney disease: Secondary | ICD-10-CM | POA: Diagnosis not present

## 2018-10-31 DIAGNOSIS — Z96641 Presence of right artificial hip joint: Secondary | ICD-10-CM | POA: Diagnosis not present

## 2018-10-31 DIAGNOSIS — D631 Anemia in chronic kidney disease: Secondary | ICD-10-CM | POA: Diagnosis not present

## 2018-11-07 DIAGNOSIS — D631 Anemia in chronic kidney disease: Secondary | ICD-10-CM | POA: Diagnosis not present

## 2018-11-07 DIAGNOSIS — M1712 Unilateral primary osteoarthritis, left knee: Secondary | ICD-10-CM | POA: Diagnosis not present

## 2018-11-07 DIAGNOSIS — Z471 Aftercare following joint replacement surgery: Secondary | ICD-10-CM | POA: Diagnosis not present

## 2018-11-07 DIAGNOSIS — K219 Gastro-esophageal reflux disease without esophagitis: Secondary | ICD-10-CM | POA: Diagnosis not present

## 2018-11-07 DIAGNOSIS — E785 Hyperlipidemia, unspecified: Secondary | ICD-10-CM | POA: Diagnosis not present

## 2018-11-07 DIAGNOSIS — Z96642 Presence of left artificial hip joint: Secondary | ICD-10-CM | POA: Diagnosis not present

## 2018-11-07 DIAGNOSIS — I129 Hypertensive chronic kidney disease with stage 1 through stage 4 chronic kidney disease, or unspecified chronic kidney disease: Secondary | ICD-10-CM | POA: Diagnosis not present

## 2018-11-07 DIAGNOSIS — R69 Illness, unspecified: Secondary | ICD-10-CM | POA: Diagnosis not present

## 2018-11-07 DIAGNOSIS — Z96641 Presence of right artificial hip joint: Secondary | ICD-10-CM | POA: Diagnosis not present

## 2018-11-07 DIAGNOSIS — N189 Chronic kidney disease, unspecified: Secondary | ICD-10-CM | POA: Diagnosis not present

## 2018-11-08 ENCOUNTER — Telehealth: Payer: Self-pay

## 2018-11-10 ENCOUNTER — Other Ambulatory Visit: Payer: Self-pay

## 2018-11-11 ENCOUNTER — Encounter: Payer: Self-pay | Admitting: Family Medicine

## 2018-11-11 ENCOUNTER — Other Ambulatory Visit: Payer: Self-pay

## 2018-11-11 ENCOUNTER — Ambulatory Visit (INDEPENDENT_AMBULATORY_CARE_PROVIDER_SITE_OTHER): Payer: Medicare HMO | Admitting: Family Medicine

## 2018-11-11 VITALS — BP 121/67 | HR 62 | Temp 97.9°F | Ht 70.1 in | Wt 207.2 lb

## 2018-11-11 DIAGNOSIS — R972 Elevated prostate specific antigen [PSA]: Secondary | ICD-10-CM

## 2018-11-11 DIAGNOSIS — K219 Gastro-esophageal reflux disease without esophagitis: Secondary | ICD-10-CM

## 2018-11-11 DIAGNOSIS — E782 Mixed hyperlipidemia: Secondary | ICD-10-CM | POA: Diagnosis not present

## 2018-11-11 DIAGNOSIS — J3089 Other allergic rhinitis: Secondary | ICD-10-CM | POA: Diagnosis not present

## 2018-11-11 DIAGNOSIS — I10 Essential (primary) hypertension: Secondary | ICD-10-CM

## 2018-11-11 DIAGNOSIS — Z0000 Encounter for general adult medical examination without abnormal findings: Secondary | ICD-10-CM

## 2018-11-11 DIAGNOSIS — J309 Allergic rhinitis, unspecified: Secondary | ICD-10-CM | POA: Insufficient documentation

## 2018-11-11 DIAGNOSIS — Z96642 Presence of left artificial hip joint: Secondary | ICD-10-CM | POA: Diagnosis not present

## 2018-11-11 LAB — UA/M W/RFLX CULTURE, ROUTINE
Bilirubin, UA: NEGATIVE
Glucose, UA: NEGATIVE
Ketones, UA: NEGATIVE
Leukocytes,UA: NEGATIVE
Nitrite, UA: NEGATIVE
Protein,UA: NEGATIVE
RBC, UA: NEGATIVE
Specific Gravity, UA: 1.02 (ref 1.005–1.030)
Urobilinogen, Ur: 0.2 mg/dL (ref 0.2–1.0)
pH, UA: 5.5 (ref 5.0–7.5)

## 2018-11-11 MED ORDER — ATORVASTATIN CALCIUM 10 MG PO TABS: 10.0000 mg | ORAL_TABLET | Freq: Every day | ORAL | 1 refills | Status: DC

## 2018-11-11 MED ORDER — FLUTICASONE PROPIONATE 50 MCG/ACT NA SUSP: 2.0000 | Freq: Every day | NASAL | 6 refills | Status: DC

## 2018-11-11 MED ORDER — ALBUTEROL SULFATE HFA 108 (90 BASE) MCG/ACT IN AERS: 2.0000 | INHALATION_SPRAY | Freq: Four times a day (QID) | RESPIRATORY_TRACT | 0 refills | Status: DC | PRN

## 2018-11-11 NOTE — Progress Notes (Signed)
BP 121/67   Pulse 62   Temp 97.9 F (36.6 C) (Oral)   Ht 5' 10.1" (1.781 m)   Wt 207 lb 3.2 oz (94 kg)   SpO2 99%   BMI 29.65 kg/m    Subjective:    Patient ID: Matthew Randolph, male    DOB: 07-09-1940, 78 y.o.   MRN: [id number]  HPI: Matthew Randolph is a 78 y.o. male presenting on 11/11/2018 for comprehensive medical examination. Current medical complaints include:see below  Recent left total hip replacement surgery, doing very well with rehabilitation. Already feeling better than previous per patient. Following closely with Ortho Surgery for this.  HTN - Home BPs 120s/60s-70s. Taking his medicines faithfully without side effects. Denies CP, SOB, HAs, dizziness.   Allergic rhinitis - having intermittent sinus pressure, congestion. Not on anything for allergy control at this time.   GERD - on protonix prn, no breakthrough sxs with this regimen. Denies epigastric pain, melena, belching.   HLD - on lipitor, tolerating well. Denies CP, SOB, claudication, myalgias. Trying to stay active and eat a healthy diet.   Hx of elevated PSA. No new sxs, levels have been stable for well over a year.   He currently lives with: Interim Problems from his last visit: no  Depression Screen done today and results listed below:  Depression screen [facility] 2/9 11/11/2018 08/18/2018 08/20/2017 08/05/2017 07/29/2016  Decreased Interest 0 0 0 0 0  Down, Depressed, Hopeless 0 0 0 0 0  PHQ - 2 Score 0 0 0 0 0  Altered sleeping - - 0 - -  Tired, decreased energy - - 0 - -  Change in appetite - - 0 - -  Feeling bad or failure about yourself  - - 0 - -  Trouble concentrating - - 0 - -  Moving slowly or fidgety/restless - - 0 - -  Suicidal thoughts - - 0 - -  PHQ-9 Score - - 0 - -    The patient does not have a history of falls. I did complete a risk assessment for falls. A plan of care for falls was documented.   Past Medical History:  Past Medical History:  Diagnosis Date  . Arthritis   . Benign tumor  of pituitary gland ([place])   . Chronic kidney disease   . ED (erectile dysfunction)   . Erectile dysfunction   . GERD (gastroesophageal reflux disease)   . High risk sexual behavior   . Hyperlipidemia   . Hypertension   . Pneumonia 2012  . Tobacco use disorder, continuous     Surgical History:  Past Surgical History:  Procedure Laterality Date  . COLONOSCOPY  2015  . LIPOMA EXCISION Left 10/04/2018   Procedure: EXCISION LIPOMA LEFT HIP;  Surgeon: Hessie Knows, MD;  Location: [facility] ORS;  Service: Orthopedics;  Laterality: Left;  . PITUITARY SURGERY  12/10/2015  . TOTAL HIP ARTHROPLASTY Right 09/09/2017   Procedure: TOTAL HIP ARTHROPLASTY ANTERIOR APPROACH;  Surgeon: Hessie Knows, MD;  Location: [facility] ORS;  Service: Orthopedics;  Laterality: Right;  . TOTAL HIP ARTHROPLASTY Left 10/04/2018   Procedure: TOTAL HIP ARTHROPLASTY ANTERIOR APPROACH;  Surgeon: Hessie Knows, MD;  Location: [facility] ORS;  Service: Orthopedics;  Laterality: Left;    Medications:  Current Outpatient Medications on File Prior to Visit  Medication Sig  . docusate sodium (COLACE) 100 MG capsule Take 1 capsule (100 mg total) by mouth 2 (two) times daily.  Marland Kitchen HYDROcodone-acetaminophen (NORCO/VICODIN) 5-325 MG tablet Take 1  tablet by mouth every 4 (four) hours as needed for moderate pain (pain score 4-6).  Marland Kitchen lisinopril (ZESTRIL) 5 MG tablet TAKE 1 TABLET DAILY  . Menthol, Topical Analgesic, (ICY HOT EX) Apply 1 application topically daily as needed (pain).  . methocarbamol (ROBAXIN) 500 MG tablet Take 1 tablet (500 mg total) by mouth every 6 (six) hours as needed for muscle spasms.  . Multiple Vitamin (MULTIVITAMIN) tablet Take 1 tablet by mouth daily.  . pantoprazole (PROTONIX) 40 MG tablet Take 1 tablet (40 mg total) by mouth daily as needed. (Patient taking differently: Take 40 mg by mouth daily as needed (heartburn). )  . aspirin EC 325 MG EC tablet Take 1 tablet (325 mg total) by mouth daily with breakfast. (Patient not  taking: Reported on 09/30/2018)  . meloxicam (MOBIC) 15 MG tablet Take 15 mg by mouth daily.   No current facility-administered medications on file prior to visit.     Allergies:  No Known Allergies  Social History:  Social History   Socioeconomic History  . Marital status: Divorced    Spouse name: Not on file  . Number of children: 4  . Years of education: 12th  . Highest education level: High school graduate  Occupational History  . Occupation: Retired [employer]  . Financial resource strain: Not hard at all  . Food insecurity    Worry: Never true    Inability: Never true  . Transportation needs    Medical: No    Non-medical: No  Tobacco Use  . Smoking status: Current Every Day Smoker    Packs/day: 0.25    Years: 40.00    Pack years: 10.00    Types: Cigarettes  . Smokeless tobacco: Never Used  . Tobacco comment: 2-3 cig daily  Substance and Sexual Activity  . Alcohol use: Not Currently  . Drug use: No  . Sexual activity: Yes    Partners: Female  Lifestyle  . Physical activity    Days per week: 0 days    Minutes per session: 0 min  . Stress: Not at all  Relationships  . Social connections    Talks on phone: More than three times a week    Gets together: More than three times a week    Attends religious service: Never    Active member of club or organization: No    Attends meetings of clubs or organizations: Never    Relationship status: Living with partner  . Intimate partner violence    Fear of current or ex partner: No    Emotionally abused: No    Physically abused: No    Forced sexual activity: No  Other Topics Concern  . Not on file  Social History Narrative   ** Merged History Encounter **       Social History   Tobacco Use  Smoking Status Current Every Day Smoker  . Packs/day: 0.25  . Years: 40.00  . Pack years: 10.00  . Types: Cigarettes  Smokeless Tobacco Never Used  Tobacco Comment   2-3 cig daily   Social History    Substance and Sexual Activity  Alcohol Use Not Currently    Family History:  Family History  Problem Relation Age of Onset  . Brain cancer Mother   . Cancer Mother        Brain tumor  . Thyroid disease Mother   . Lung disease Mother        From [place]  . Heart  disease Father   . Stroke Father   . Hypertension Brother   . Aneurysm Brother   . Pneumonia Brother   . Heart disease Sister        massive MI  . Obesity Sister     Past medical history, surgical history, medications, allergies, family history and social history reviewed with patient today and changes made to appropriate areas of the chart.   Review of Systems - General ROS: negative Psychological ROS: negative Ophthalmic ROS: negative ENT ROS: negative Allergy and Immunology ROS: positive for - nasal congestion and seasonal allergies Hematological and Lymphatic ROS: negative Endocrine ROS: negative Respiratory ROS: no cough, shortness of breath, or wheezing Cardiovascular ROS: no chest pain or dyspnea on exertion Gastrointestinal ROS: no abdominal pain, change in bowel habits, or black or bloody stools Genito-Urinary ROS: no dysuria, trouble voiding, or hematuria Musculoskeletal ROS: left hip post op pain Neurological ROS: no TIA or stroke symptoms All other ROS negative except what is listed above and in the HPI.      Objective:    BP 121/67   Pulse 62   Temp 97.9 F (36.6 C) (Oral)   Ht 5' 10.1" (1.781 m)   Wt 207 lb 3.2 oz (94 kg)   SpO2 99%   BMI 29.65 kg/m   Wt Readings from Last 3 Encounters:  11/11/18 207 lb 3.2 oz (94 kg)  10/04/18 212 lb 8.4 oz (96.4 kg)  10/03/18 212 lb 8 oz (96.4 kg)    Physical Exam Vitals signs and nursing note reviewed.  Constitutional:      General: He is not in acute distress.    Appearance: He is well-developed.  HENT:     Head: Atraumatic.     Right Ear: Tympanic membrane and external ear normal.     Left Ear: Tympanic membrane and external ear normal.      Nose: Nose normal.     Mouth/Throat:     Mouth: Mucous membranes are moist.     Pharynx: Oropharynx is clear.  Eyes:     General: No scleral icterus.    Conjunctiva/sclera: Conjunctivae normal.     Pupils: Pupils are equal, round, and reactive to light.  Neck:     Musculoskeletal: Normal range of motion and neck supple.  Cardiovascular:     Rate and Rhythm: Normal rate and regular rhythm.     Heart sounds: Normal heart sounds. No murmur.  Pulmonary:     Effort: Pulmonary effort is normal. No respiratory distress.     Breath sounds: Normal breath sounds.  Abdominal:     General: Bowel sounds are normal. There is no distension.     Palpations: Abdomen is soft. There is no mass.     Tenderness: There is no abdominal tenderness. There is no guarding.  Genitourinary:    Rectum: Normal.     Comments: Prostate mildly enlarged. Symmetric, no nodules or ttp  Musculoskeletal:     Comments: Mildly antalgic gait from recent total hip replacement   Skin:    General: Skin is warm and dry.     Findings: No rash.  Neurological:     General: No focal deficit present.     Mental Status: He is alert and oriented to person, place, and time.     Deep Tendon Reflexes: Reflexes are normal and symmetric.  Psychiatric:        Mood and Affect: Mood normal.        Behavior: Behavior normal.  Thought Content: Thought content normal.        Judgment: Judgment normal.     Results for orders placed or performed in visit on 11/11/18  CBC with Differential/Platelet out  Result Value Ref Range   WBC 8.7 3.4 - 10.8 x10E3/uL   RBC 4.47 4.14 - 5.80 x10E6/uL   Hemoglobin 12.0 (L) 13.0 - 17.7 g/dL   Hematocrit 38.6 37.5 - 51.0 %   MCV 86 79 - 97 fL   MCH 26.8 26.6 - 33.0 pg   MCHC 31.1 (L) 31.5 - 35.7 g/dL   RDW 12.5 11.6 - 15.4 %   Platelets 338 150 - 450 x10E3/uL   Neutrophils 55 Not Estab. %   Lymphs 36 Not Estab. %   Monocytes 7 Not Estab. %   Eos 1 Not Estab. %   Basos 1 Not Estab. %    Neutrophils Absolute 4.7 1.4 - 7.0 x10E3/uL   Lymphocytes Absolute 3.2 (H) 0.7 - 3.1 x10E3/uL   Monocytes Absolute 0.6 0.1 - 0.9 x10E3/uL   EOS (ABSOLUTE) 0.1 0.0 - 0.4 x10E3/uL   Basophils Absolute 0.1 0.0 - 0.2 x10E3/uL   Immature Granulocytes 0 Not Estab. %   Immature Grans (Abs) 0.0 0.0 - 0.1 x10E3/uL  Comprehensive metabolic panel  Result Value Ref Range   Glucose 97 65 - 99 mg/dL   BUN 11 8 - 27 mg/dL   Creatinine, Ser 1.17 0.76 - 1.27 mg/dL   GFR calc non Af Amer 61 >59 mL/min/1.73   GFR calc Af Amer 70 >59 mL/min/1.73   BUN/Creatinine Ratio 9 (L) 10 - 24   Sodium 142 134 - 144 mmol/L   Potassium 4.7 3.5 - 5.2 mmol/L   Chloride 103 96 - 106 mmol/L   CO2 21 20 - 29 mmol/L   Calcium 9.9 8.6 - 10.2 mg/dL   Total Protein 7.5 6.0 - 8.5 g/dL   Albumin 4.0 3.7 - 4.7 g/dL   Globulin, Total 3.5 1.5 - 4.5 g/dL   Albumin/Globulin Ratio 1.1 (L) 1.2 - 2.2   Bilirubin Total 0.4 0.0 - 1.2 mg/dL   Alkaline Phosphatase 79 39 - 117 IU/L   AST 25 0 - 40 IU/L   ALT 21 0 - 44 IU/L  Lipid Panel w/o Chol/HDL Ratio out  Result Value Ref Range   Cholesterol, Total 166 100 - 199 mg/dL   Triglycerides 87 0 - 149 mg/dL   HDL 61 >39 mg/dL   VLDL Cholesterol Cal 16 5 - 40 mg/dL   LDL Chol Calc (NIH) 89 0 - 99 mg/dL  PSA  Result Value Ref Range   Prostate Specific Ag, Serum 2.5 0.0 - 4.0 ng/mL  UA/M w/rflx Culture, Routine   Specimen: Urine   URINE  Result Value Ref Range   Specific Gravity, UA 1.020 1.005 - 1.030   pH, UA 5.5 5.0 - 7.5   Color, UA Yellow Yellow   Appearance Ur Clear Clear   Leukocytes,UA Negative Negative   Protein,UA Negative Negative/Trace   Glucose, UA Negative Negative   Ketones, UA Negative Negative   RBC, UA Negative Negative   Bilirubin, UA Negative Negative   Urobilinogen, Ur 0.2 0.2 - 1.0 mg/dL   Nitrite, UA Negative Negative      Assessment & Plan:   Problem List Items Addressed This Visit      Cardiovascular and Mediastinum   Benign essential HTN -  Primary    Stable and under good control, continue current regimen  Relevant Medications   atorvastatin (LIPITOR) 10 MG tablet   Other Relevant Orders   CBC with Differential/Platelet out (Completed)   UA/M w/rflx Culture, Routine (Completed)     Respiratory   Allergic rhinitis    Recommended flonase and antihistamine regimen to keep allergy sxs under better control        Digestive   GERD (gastroesophageal reflux disease)    Stable on prn protonix, continue current regimen        Other   Hyperlipemia    Recheck lipids, adjust as needed. Continue current medication      Relevant Medications   atorvastatin (LIPITOR) 10 MG tablet   Other Relevant Orders   Comprehensive metabolic panel (Completed)   Lipid Panel w/o Chol/HDL Ratio out (Completed)   Elevated PSA    Recheck PSA, continue to monitor      Relevant Orders   PSA (Completed)   Status post total hip replacement, left    Recovering well, currently in physical therapy. Has f/u with Orthopedics next week       Other Visit Diagnoses    Annual physical exam           Discussed aspirin prophylaxis for myocardial infarction prevention and decision was made to continue ASA  LABORATORY TESTING:  Health maintenance labs ordered today as discussed above.   The natural history of prostate cancer and ongoing controversy regarding screening and potential treatment outcomes of prostate cancer has been discussed with the patient. The meaning of a false positive PSA and a false negative PSA has been discussed. He indicates understanding of the limitations of this screening test and wishes to proceed with screening PSA testing.   IMMUNIZATIONS:   - Tdap: Tetanus vaccination status reviewed: last tetanus booster within 10 years. - Influenza: Up to date - Pneumovax: Up to date - Prevnar: Up to date  SCREENING: - Colonoscopy: Up to date  Discussed with patient purpose of the colonoscopy is to detect colon cancer at  curable precancerous or early stages   PATIENT COUNSELING:    Sexuality: Discussed sexually transmitted diseases, partner selection, use of condoms, avoidance of unintended pregnancy  and contraceptive alternatives.   Advised to avoid cigarette smoking.  I discussed with the patient that most people either abstain from alcohol or drink within safe limits (<=14/week and <=4 drinks/occasion for males, <=7/weeks and <= 3 drinks/occasion for females) and that the risk for alcohol disorders and other health effects rises proportionally with the number of drinks per week and how often a drinker exceeds daily limits.  Discussed cessation/primary prevention of drug use and availability of treatment for abuse.   Diet: Encouraged to adjust caloric intake to maintain  or achieve ideal body weight, to reduce intake of dietary saturated fat and total fat, to limit sodium intake by avoiding high sodium foods and not adding table salt, and to maintain adequate dietary potassium and calcium preferably from fresh fruits, vegetables, and low-fat dairy products.    stressed the importance of regular exercise  Injury prevention: Discussed safety belts, safety helmets, smoke detector, smoking near bedding or upholstery.   Dental health: Discussed importance of regular tooth brushing, flossing, and dental visits.   Follow up plan: NEXT PREVENTATIVE PHYSICAL DUE IN 1 YEAR. Return in about 6 months (around 05/11/2019) for 6 month f/u.

## 2018-11-11 NOTE — Assessment & Plan Note (Deleted)
Recovering well, currently in physical therapy. Has f/u with Orthopedics next week

## 2018-11-11 NOTE — Assessment & Plan Note (Signed)
Stable on prn protonix, continue current regimen

## 2018-11-12 LAB — CBC WITH DIFFERENTIAL/PLATELET
Basophils Absolute: 0.1 10*3/uL (ref 0.0–0.2)
Basos: 1 %
EOS (ABSOLUTE): 0.1 10*3/uL (ref 0.0–0.4)
Eos: 1 %
Hematocrit: 38.6 % (ref 37.5–51.0)
Hemoglobin: 12 g/dL — ABNORMAL LOW (ref 13.0–17.7)
Immature Grans (Abs): 0 10*3/uL (ref 0.0–0.1)
Immature Granulocytes: 0 %
Lymphocytes Absolute: 3.2 10*3/uL — ABNORMAL HIGH (ref 0.7–3.1)
Lymphs: 36 %
MCH: 26.8 pg (ref 26.6–33.0)
MCHC: 31.1 g/dL — ABNORMAL LOW (ref 31.5–35.7)
MCV: 86 fL (ref 79–97)
Monocytes Absolute: 0.6 10*3/uL (ref 0.1–0.9)
Monocytes: 7 %
Neutrophils Absolute: 4.7 10*3/uL (ref 1.4–7.0)
Neutrophils: 55 %
Platelets: 338 10*3/uL (ref 150–450)
RBC: 4.47 x10E6/uL (ref 4.14–5.80)
RDW: 12.5 % (ref 11.6–15.4)
WBC: 8.7 10*3/uL (ref 3.4–10.8)

## 2018-11-12 LAB — COMPREHENSIVE METABOLIC PANEL
ALT: 21 IU/L (ref 0–44)
AST: 25 IU/L (ref 0–40)
Albumin/Globulin Ratio: 1.1 — ABNORMAL LOW (ref 1.2–2.2)
Albumin: 4 g/dL (ref 3.7–4.7)
Alkaline Phosphatase: 79 IU/L (ref 39–117)
BUN/Creatinine Ratio: 9 — ABNORMAL LOW (ref 10–24)
BUN: 11 mg/dL (ref 8–27)
Bilirubin Total: 0.4 mg/dL (ref 0.0–1.2)
CO2: 21 mmol/L (ref 20–29)
Calcium: 9.9 mg/dL (ref 8.6–10.2)
Chloride: 103 mmol/L (ref 96–106)
Creatinine, Ser: 1.17 mg/dL (ref 0.76–1.27)
GFR calc Af Amer: 70 mL/min/{1.73_m2} (ref >59)
GFR calc non Af Amer: 61 mL/min/{1.73_m2} (ref >59)
Globulin, Total: 3.5 g/dL (ref 1.5–4.5)
Glucose: 97 mg/dL (ref 65–99)
Potassium: 4.7 mmol/L (ref 3.5–5.2)
Sodium: 142 mmol/L (ref 134–144)
Total Protein: 7.5 g/dL (ref 6.0–8.5)

## 2018-11-12 LAB — LIPID PANEL W/O CHOL/HDL RATIO
Cholesterol, Total: 166 mg/dL (ref 100–199)
HDL: 61 mg/dL (ref >39)
LDL Chol Calc (NIH): 89 mg/dL (ref 0–99)
Triglycerides: 87 mg/dL (ref 0–149)
VLDL Cholesterol Cal: 16 mg/dL (ref 5–40)

## 2018-11-12 LAB — PSA: Prostate Specific Ag, Serum: 2.5 ng/mL (ref 0.0–4.0)

## 2018-11-14 DIAGNOSIS — K219 Gastro-esophageal reflux disease without esophagitis: Secondary | ICD-10-CM | POA: Diagnosis not present

## 2018-11-14 DIAGNOSIS — N189 Chronic kidney disease, unspecified: Secondary | ICD-10-CM | POA: Diagnosis not present

## 2018-11-14 DIAGNOSIS — E785 Hyperlipidemia, unspecified: Secondary | ICD-10-CM | POA: Diagnosis not present

## 2018-11-14 DIAGNOSIS — M1712 Unilateral primary osteoarthritis, left knee: Secondary | ICD-10-CM | POA: Diagnosis not present

## 2018-11-14 DIAGNOSIS — Z96641 Presence of right artificial hip joint: Secondary | ICD-10-CM | POA: Diagnosis not present

## 2018-11-14 DIAGNOSIS — R69 Illness, unspecified: Secondary | ICD-10-CM | POA: Diagnosis not present

## 2018-11-14 DIAGNOSIS — I129 Hypertensive chronic kidney disease with stage 1 through stage 4 chronic kidney disease, or unspecified chronic kidney disease: Secondary | ICD-10-CM | POA: Diagnosis not present

## 2018-11-14 DIAGNOSIS — D631 Anemia in chronic kidney disease: Secondary | ICD-10-CM | POA: Diagnosis not present

## 2018-11-14 DIAGNOSIS — Z471 Aftercare following joint replacement surgery: Secondary | ICD-10-CM | POA: Diagnosis not present

## 2018-11-14 DIAGNOSIS — Z96642 Presence of left artificial hip joint: Secondary | ICD-10-CM | POA: Diagnosis not present

## 2018-11-16 NOTE — Assessment & Plan Note (Signed)
Stable and under good control, continue current regimen 

## 2018-11-16 NOTE — Assessment & Plan Note (Signed)
Recovering well, currently in physical therapy. Has f/u with Orthopedics next week

## 2018-11-16 NOTE — Assessment & Plan Note (Signed)
Recommended flonase and antihistamine regimen to keep allergy sxs under better control

## 2018-11-16 NOTE — Assessment & Plan Note (Signed)
Recheck PSA, continue to monitor

## 2018-11-16 NOTE — Assessment & Plan Note (Signed)
Recheck lipids, adjust as needed. Continue current medication

## 2018-11-18 DIAGNOSIS — M25552 Pain in left hip: Secondary | ICD-10-CM | POA: Diagnosis not present

## 2018-12-07 ENCOUNTER — Telehealth: Payer: Self-pay

## 2018-12-21 ENCOUNTER — Encounter: Payer: Self-pay | Admitting: Family Medicine

## 2018-12-21 ENCOUNTER — Telehealth: Payer: Self-pay

## 2018-12-21 NOTE — Telephone Encounter (Signed)
Copied from [place] [id number]. Topic: General - Other >> Dec 21, 2018 11:00 AM Lennox Solders wrote: Reason for CRM: pt has sent mychart message and was exposed to someone who tested positive for covid and would like to know how many day should he wait before getting tested. Pt no having any symptoms   Routing to provider to advise.

## 2018-12-21 NOTE — Telephone Encounter (Signed)
Already responded via [employer]. OK to relay information if they did not get the message yet

## 2018-12-21 NOTE — Telephone Encounter (Signed)
Called and spoke with patient. Read patient Matthew Randolph's response to his mychart message. Patient verbalized understanding.

## 2019-01-09 ENCOUNTER — Ambulatory Visit: Payer: Self-pay

## 2019-01-10 NOTE — Chronic Care Management (AMB) (Signed)
  Care Management   Follow Up Note   01/10/2019 Name: Matthew Randolph MRN: [id number] DOB: [birth year]  Referred by: Volney American, PA-C Reason for referral : Care Coordination   Matthew Randolph is a 79 y.o. year old male who is a primary care patient of Volney American, [place]. The care management team was consulted for assistance with care management and care coordination needs.    Review of patient status, including review of consultants reports, relevant laboratory and other test results, and collaboration with appropriate care team members and the patient's provider was performed as part of comprehensive patient evaluation and provision of chronic care management services.    LCSW completed CCM outreach attempt today but was unable to reach patient successfully. A HIPPA compliant voice message was left encouraging patient to return call once available. LCSW rescheduled CCM SW appointment as well.  A HIPPA compliant phone message was left for the patient providing contact information and requesting a return call.   Eula Fried, BSW, MSW, [facility] [email or web address] Phone: [phone]

## 2019-01-12 DIAGNOSIS — K029 Dental caries, unspecified: Secondary | ICD-10-CM | POA: Diagnosis not present

## 2019-02-10 ENCOUNTER — Telehealth: Payer: Self-pay

## 2019-02-25 ENCOUNTER — Encounter: Payer: Self-pay | Admitting: Family Medicine

## 2019-03-25 ENCOUNTER — Other Ambulatory Visit: Payer: Self-pay | Admitting: Nurse Practitioner

## 2019-04-17 ENCOUNTER — Other Ambulatory Visit: Payer: Self-pay | Admitting: Family Medicine

## 2019-04-26 ENCOUNTER — Encounter: Payer: Self-pay | Admitting: Family Medicine

## 2019-05-11 ENCOUNTER — Ambulatory Visit: Payer: Medicare HMO | Admitting: Family Medicine

## 2019-06-12 ENCOUNTER — Encounter: Payer: Self-pay | Admitting: Family Medicine

## 2019-06-12 ENCOUNTER — Ambulatory Visit (INDEPENDENT_AMBULATORY_CARE_PROVIDER_SITE_OTHER): Payer: Medicare HMO | Admitting: Family Medicine

## 2019-06-12 ENCOUNTER — Other Ambulatory Visit: Payer: Self-pay

## 2019-06-12 VITALS — BP 138/79 | HR 45 | Temp 98.2°F | Wt 216.0 lb

## 2019-06-12 DIAGNOSIS — E782 Mixed hyperlipidemia: Secondary | ICD-10-CM

## 2019-06-12 DIAGNOSIS — K219 Gastro-esophageal reflux disease without esophagitis: Secondary | ICD-10-CM | POA: Diagnosis not present

## 2019-06-12 DIAGNOSIS — I129 Hypertensive chronic kidney disease with stage 1 through stage 4 chronic kidney disease, or unspecified chronic kidney disease: Secondary | ICD-10-CM | POA: Diagnosis not present

## 2019-06-12 DIAGNOSIS — J3089 Other allergic rhinitis: Secondary | ICD-10-CM | POA: Diagnosis not present

## 2019-06-12 MED ORDER — FLUTICASONE PROPIONATE 50 MCG/ACT NA SUSP: 2.0000 | Freq: Every day | NASAL | 3 refills | Status: DC

## 2019-06-12 MED ORDER — ALBUTEROL SULFATE HFA 108 (90 BASE) MCG/ACT IN AERS: 2.0000 | INHALATION_SPRAY | Freq: Four times a day (QID) | RESPIRATORY_TRACT | 3 refills | Status: DC | PRN

## 2019-06-12 NOTE — Progress Notes (Signed)
BP 138/79   Pulse (!) 45   Temp 98.2 F (36.8 C) (Oral)   Wt 216 lb (98 kg)   SpO2 98%   BMI 30.90 kg/m    Subjective:    Patient ID: Matthew Randolph, male    DOB: December 12, 1940, 79 y.o.   MRN: [id number]  HPI: Matthew Randolph is a 79 y.o. male  Chief Complaint  Patient presents with  . Hypertension  . Hyperlipidemia  . Gastroesophageal Reflux  . Allergies    flonase nasal spray refill   Patient presenting today for 6 month f/u chronic conditions. Doing well, no new concerns. Taking medications faithfully without side effects. Denies CP, SOB, HAs, dizziness, fatigue, claudication, myalgias.   HTN - home BPs running 120s/130s/80s. Staying active, trying to eat healthy diet.   HLD - taking lipitor without side effects.   GERD - no breakthrough sxs with protonix regimen. Trying to avoid foods that trigger issues.   Allergies under good control, taking regimen consistently. No recent exacerbations.   Relevant past medical, surgical, family and social history reviewed and updated as indicated. Interim medical history since our last visit reviewed. Allergies and medications reviewed and updated.  Review of Systems  Per HPI unless specifically indicated above     Objective:    BP 138/79   Pulse (!) 45   Temp 98.2 F (36.8 C) (Oral)   Wt 216 lb (98 kg)   SpO2 98%   BMI 30.90 kg/m   Wt Readings from Last 3 Encounters:  06/12/19 216 lb (98 kg)  11/11/18 207 lb 3.2 oz (94 kg)  10/04/18 212 lb 8.4 oz (96.4 kg)    Physical Exam Vitals and nursing note reviewed.  Constitutional:      Appearance: Normal appearance.  HENT:     Head: Atraumatic.  Eyes:     Extraocular Movements: Extraocular movements intact.     Conjunctiva/sclera: Conjunctivae normal.  Cardiovascular:     Rate and Rhythm: Regular rhythm. Bradycardia present.  Pulmonary:     Effort: Pulmonary effort is normal.     Breath sounds: Normal breath sounds.  Musculoskeletal:        General: Normal range  of motion.     Cervical back: Normal range of motion and neck supple.  Skin:    General: Skin is warm and dry.  Neurological:     General: No focal deficit present.     Mental Status: He is oriented to person, place, and time.  Psychiatric:        Mood and Affect: Mood normal.        Thought Content: Thought content normal.        Judgment: Judgment normal.     Results for orders placed or performed in visit on 06/12/19  Comprehensive metabolic panel  Result Value Ref Range   Glucose 106 (H) 65 - 99 mg/dL   BUN 11 8 - 27 mg/dL   Creatinine, Ser 0.99 0.76 - 1.27 mg/dL   GFR calc non Af Amer 74 >59 mL/min/1.73   GFR calc Af Amer 85 >59 mL/min/1.73   BUN/Creatinine Ratio 11 10 - 24   Sodium 138 134 - 144 mmol/L   Potassium 4.2 3.5 - 5.2 mmol/L   Chloride 104 96 - 106 mmol/L   CO2 21 20 - 29 mmol/L   Calcium 9.2 8.6 - 10.2 mg/dL   Total Protein 6.8 6.0 - 8.5 g/dL   Albumin 4.2 3.7 - 4.7 g/dL   Globulin,  Total 2.6 1.5 - 4.5 g/dL   Albumin/Globulin Ratio 1.6 1.2 - 2.2   Bilirubin Total 0.4 0.0 - 1.2 mg/dL   Alkaline Phosphatase 62 48 - 121 IU/L   AST 23 0 - 40 IU/L   ALT 22 0 - 44 IU/L  Lipid Panel w/o Chol/HDL Ratio  Result Value Ref Range   Cholesterol, Total 156 100 - 199 mg/dL   Triglycerides 76 0 - 149 mg/dL   HDL 60 >39 mg/dL   VLDL Cholesterol Cal 15 5 - 40 mg/dL   LDL Chol Calc (NIH) 81 0 - 99 mg/dL      Assessment & Plan:   Problem List Items Addressed This Visit      Respiratory   Allergic rhinitis    Stable, well controlled with no recent exacerbations. Continue current regimen        Digestive   GERD (gastroesophageal reflux disease)    Stable and well controlled, continue current regimen        Genitourinary   Hypertensive renal disease - Primary    BPs stable and WNL, continue current regimen      Relevant Orders   Comprehensive metabolic panel (Completed)     Other   Hyperlipemia    Recheck lipids, adjust as needed. Continue current  regimen      Relevant Orders   Lipid Panel w/o Chol/HDL Ratio (Completed)       Follow up plan: Return in about 6 months (around 12/12/2019) for CPE.

## 2019-06-13 LAB — COMPREHENSIVE METABOLIC PANEL
ALT: 22 IU/L (ref 0–44)
AST: 23 IU/L (ref 0–40)
Albumin/Globulin Ratio: 1.6 (ref 1.2–2.2)
Albumin: 4.2 g/dL (ref 3.7–4.7)
Alkaline Phosphatase: 62 IU/L (ref 48–121)
BUN/Creatinine Ratio: 11 (ref 10–24)
BUN: 11 mg/dL (ref 8–27)
Bilirubin Total: 0.4 mg/dL (ref 0.0–1.2)
CO2: 21 mmol/L (ref 20–29)
Calcium: 9.2 mg/dL (ref 8.6–10.2)
Chloride: 104 mmol/L (ref 96–106)
Creatinine, Ser: 0.99 mg/dL (ref 0.76–1.27)
GFR calc Af Amer: 85 mL/min/{1.73_m2} (ref >59)
GFR calc non Af Amer: 74 mL/min/{1.73_m2} (ref >59)
Globulin, Total: 2.6 g/dL (ref 1.5–4.5)
Glucose: 106 mg/dL — ABNORMAL HIGH (ref 65–99)
Potassium: 4.2 mmol/L (ref 3.5–5.2)
Sodium: 138 mmol/L (ref 134–144)
Total Protein: 6.8 g/dL (ref 6.0–8.5)

## 2019-06-13 LAB — LIPID PANEL W/O CHOL/HDL RATIO
Cholesterol, Total: 156 mg/dL (ref 100–199)
HDL: 60 mg/dL (ref >39)
LDL Chol Calc (NIH): 81 mg/dL (ref 0–99)
Triglycerides: 76 mg/dL (ref 0–149)
VLDL Cholesterol Cal: 15 mg/dL (ref 5–40)

## 2019-06-17 ENCOUNTER — Encounter: Payer: Self-pay | Admitting: Family Medicine

## 2019-06-19 NOTE — Assessment & Plan Note (Signed)
Stable and well controlled, continue current regimen 

## 2019-06-19 NOTE — Assessment & Plan Note (Signed)
BPs stable and WNL, continue current regimen 

## 2019-06-19 NOTE — Assessment & Plan Note (Signed)
Recheck lipids, adjust as needed. Continue current regimen 

## 2019-06-19 NOTE — Assessment & Plan Note (Signed)
Stable, well controlled with no recent exacerbations. Continue current regimen

## 2019-08-15 ENCOUNTER — Encounter: Payer: Self-pay | Admitting: Family Medicine

## 2019-08-30 ENCOUNTER — Telehealth: Payer: Self-pay | Admitting: Family Medicine

## 2019-08-30 NOTE — Telephone Encounter (Signed)
Copied from [place] [id number]. Topic: Medicare AWV >> Aug 30, 2019  1:53 PM Cher Nakai R wrote: Reason for CRM:   Left message for patient to call back and schedule the Medicare Annual Wellness Visit (AWV) virtually.  Last AWV 08/18/2018  Please schedule at anytime with [facility]Nurse Health Advisor.  45 minute appointment  Any questions, please call me at [phone]

## 2019-09-13 DIAGNOSIS — H903 Sensorineural hearing loss, bilateral: Secondary | ICD-10-CM | POA: Diagnosis not present

## 2019-09-18 ENCOUNTER — Ambulatory Visit (INDEPENDENT_AMBULATORY_CARE_PROVIDER_SITE_OTHER): Payer: Medicare HMO

## 2019-09-18 VITALS — BP 131/68 | HR 55 | Temp 97.1°F | Ht 72.0 in | Wt 215.0 lb

## 2019-09-18 DIAGNOSIS — Z0000 Encounter for general adult medical examination without abnormal findings: Secondary | ICD-10-CM

## 2019-09-18 NOTE — Patient Instructions (Signed)
Matthew Randolph , Thank you for taking time to come for your Medicare Wellness Visit. I appreciate your ongoing commitment to your health goals. Please review the following plan we discussed and let me know if I can assist you in the future.   Screening recommendations/referrals: Colonoscopy: not required Recommended yearly ophthalmology/optometry visit for glaucoma screening and checkup Recommended yearly dental visit for hygiene and checkup  Vaccinations: Influenza vaccine: due Pneumococcal vaccine: completed 12/13/2013 Tdap vaccine: completed 08/19/2016 Shingles vaccine: discussed   Covid-19:  04/15/2019, 03/11/2019  Advanced directives: Advance directive discussed with you today.    Conditions/risks identified: smoking  Next appointment: Follow up in one year for your annual wellness visit.   Preventive Care 70 Years and Older, Male Preventive care refers to lifestyle choices and visits with your health care provider that can promote health and wellness. What does preventive care include?  A yearly physical exam. This is also called an annual well check.  Dental exams once or twice a year.  Routine eye exams. Ask your health care provider how often you should have your eyes checked.  Personal lifestyle choices, including:  Daily care of your teeth and gums.  Regular physical activity.  Eating a healthy diet.  Avoiding tobacco and drug use.  Limiting alcohol use.  Practicing safe sex.  Taking low doses of aspirin every day.  Taking vitamin and mineral supplements as recommended by your health care provider. What happens during an annual well check? The services and screenings done by your health care provider during your annual well check will depend on your age, overall health, lifestyle risk factors, and family history of disease. Counseling  Your health care provider may ask you questions about your:  Alcohol use.  Tobacco use.  Drug use.  Emotional  well-being.  Home and relationship well-being.  Sexual activity.  Eating habits.  History of falls.  Memory and ability to understand (cognition).  Work and work Statistician. Screening  You may have the following tests or measurements:  Height, weight, and BMI.  Blood pressure.  Lipid and cholesterol levels. These may be checked every 5 years, or more frequently if you are over 7 years old.  Skin check.  Lung cancer screening. You may have this screening every year starting at age 48 if you have a 30-pack-year history of smoking and currently smoke or have quit within the past 15 years.  Fecal occult blood test (FOBT) of the stool. You may have this test every year starting at age 28.  Flexible sigmoidoscopy or colonoscopy. You may have a sigmoidoscopy every 5 years or a colonoscopy every 10 years starting at age 72.  Prostate cancer screening. Recommendations will vary depending on your family history and other risks.  Hepatitis C blood test.  Hepatitis B blood test.  Sexually transmitted disease (STD) testing.  Diabetes screening. This is done by checking your blood sugar (glucose) after you have not eaten for a while (fasting). You may have this done every 1-3 years.  Abdominal aortic aneurysm (AAA) screening. You may need this if you are a current or former smoker.  Osteoporosis. You may be screened starting at age 38 if you are at high risk. Talk with your health care provider about your test results, treatment options, and if necessary, the need for more tests. Vaccines  Your health care provider may recommend certain vaccines, such as:  Influenza vaccine. This is recommended every year.  Tetanus, diphtheria, and acellular pertussis (Tdap, Td) vaccine. You may need  a Td booster every 10 years.  Zoster vaccine. You may need this after age 71.  Pneumococcal 13-valent conjugate (PCV13) vaccine. One dose is recommended after age 64.  Pneumococcal  polysaccharide (PPSV23) vaccine. One dose is recommended after age 89. Talk to your health care provider about which screenings and vaccines you need and how often you need them. This information is not intended to replace advice given to you by your health care provider. Make sure you discuss any questions you have with your health care provider. Document Released: 01/18/2015 Document Revised: 09/11/2015 Document Reviewed: 10/23/2014 Elsevier Interactive Patient Education  2017 [place] Prevention in the Home Falls can cause injuries. They can happen to people of all ages. There are many things you can do to make your home safe and to help prevent falls. What can I do on the outside of my home?  Regularly fix the edges of walkways and driveways and fix any cracks.  Remove anything that might make you trip as you walk through a door, such as a raised step or threshold.  Trim any bushes or trees on the path to your home.  Use bright outdoor lighting.  Clear any walking paths of anything that might make someone trip, such as rocks or tools.  Regularly check to see if handrails are loose or broken. Make sure that both sides of any steps have handrails.  Any raised decks and porches should have guardrails on the edges.  Have any leaves, snow, or ice cleared regularly.  Use sand or salt on walking paths during winter.  Clean up any spills in your garage right away. This includes oil or grease spills. What can I do in the bathroom?  Use night lights.  Install grab bars by the toilet and in the tub and shower. Do not use towel bars as grab bars.  Use non-skid mats or decals in the tub or shower.  If you need to sit down in the shower, use a plastic, non-slip stool.  Keep the floor dry. Clean up any water that spills on the floor as soon as it happens.  Remove soap buildup in the tub or shower regularly.  Attach bath mats securely with double-sided non-slip rug  tape.  Do not have throw rugs and other things on the floor that can make you trip. What can I do in the bedroom?  Use night lights.  Make sure that you have a light by your bed that is easy to reach.  Do not use any sheets or blankets that are too big for your bed. They should not hang down onto the floor.  Have a firm chair that has side arms. You can use this for support while you get dressed.  Do not have throw rugs and other things on the floor that can make you trip. What can I do in the kitchen?  Clean up any spills right away.  Avoid walking on wet floors.  Keep items that you use a lot in easy-to-reach places.  If you need to reach something above you, use a strong step stool that has a grab bar.  Keep electrical cords out of the way.  Do not use floor polish or wax that makes floors slippery. If you must use wax, use non-skid floor wax.  Do not have throw rugs and other things on the floor that can make you trip. What can I do with my stairs?  Do not leave any items on the  stairs.  Make sure that there are handrails on both sides of the stairs and use them. Fix handrails that are broken or loose. Make sure that handrails are as long as the stairways.  Check any carpeting to make sure that it is firmly attached to the stairs. Fix any carpet that is loose or worn.  Avoid having throw rugs at the top or bottom of the stairs. If you do have throw rugs, attach them to the floor with carpet tape.  Make sure that you have a light switch at the top of the stairs and the bottom of the stairs. If you do not have them, ask someone to add them for you. What else can I do to help prevent falls?  Wear shoes that:  Do not have high heels.  Have rubber bottoms.  Are comfortable and fit you well.  Are closed at the toe. Do not wear sandals.  If you use a stepladder:  Make sure that it is fully opened. Do not climb a closed stepladder.  Make sure that both sides of the  stepladder are locked into place.  Ask someone to hold it for you, if possible.  Clearly mark and make sure that you can see:  Any grab bars or handrails.  First and last steps.  Where the edge of each step is.  Use tools that help you move around (mobility aids) if they are needed. These include:  Canes.  Walkers.  Scooters.  Crutches.  Turn on the lights when you go into a dark area. Replace any light bulbs as soon as they burn out.  Set up your furniture so you have a clear path. Avoid moving your furniture around.  If any of your floors are uneven, fix them.  If there are any pets around you, be aware of where they are.  Review your medicines with your doctor. Some medicines can make you feel dizzy. This can increase your chance of falling. Ask your doctor what other things that you can do to help prevent falls. This information is not intended to replace advice given to you by your health care provider. Make sure you discuss any questions you have with your health care provider. Document Released: 10/18/2008 Document Revised: 05/30/2015 Document Reviewed: 01/26/2014 Elsevier Interactive Patient Education  2017 [employer].

## 2019-09-18 NOTE — Progress Notes (Signed)
I connected with [name] today by telephone and verified that I am speaking with the correct person using two identifiers. Location patient: home Location provider: work Persons participating in the virtual visit: Kevis, Qu LPN.   I discussed the limitations, risks, security and privacy concerns of performing an evaluation and management service by telephone and the availability of in person appointments. I also discussed with the patient that there may be a patient responsible charge related to this service. The patient expressed understanding and verbally consented to this telephonic visit.    Interactive audio and video telecommunications were attempted between this provider and patient, however failed, due to patient having technical difficulties OR patient did not have access to video capability.  We continued and completed visit with audio only.     Vital signs may be patient reported or missing.  Subjective:   Matthew Randolph is a 79 y.o. male who presents for Medicare Annual/Subsequent preventive examination.  Review of Systems     Cardiac Risk Factors include: advanced age (>87men, >52 women);dyslipidemia;hypertension;male gender     Objective:    Today's Vitals   09/18/19 1511  BP: 131/68  Pulse: (!) 55  Temp: (!) 97.1 F (36.2 C)  Weight: 215 lb (97.5 kg)  Height: 6' (1.829 m)   Body mass index is 29.16 kg/m.  Advanced Directives 09/18/2019 10/04/2018 10/04/2018 08/18/2018 09/09/2017 09/02/2017 08/05/2017  Does Patient Have a Medical Advance Directive? No No No No No No No  Type of Advance Directive - - - - - - -  Copy of Healthcare Power of Attorney in Chart? - - - - - - -  Would patient like information on creating a medical advance directive? - No - Patient declined No - Patient declined - No - Patient declined - Yes (MAU/Ambulatory/Procedural Areas - Information given)    Current Medications (verified) Outpatient Encounter Medications as of  09/18/2019  Medication Sig  . albuterol (VENTOLIN HFA) 108 (90 Base) MCG/ACT inhaler Inhale 2 puffs into the lungs every 6 (six) hours as needed for wheezing or shortness of breath.  Marland Kitchen atorvastatin (LIPITOR) 10 MG tablet TAKE 1 TABLET DAILY  . fluticasone (FLONASE) 50 MCG/ACT nasal spray Place 2 sprays into both nostrils daily.  Marland Kitchen lisinopril (ZESTRIL) 5 MG tablet TAKE 1 TABLET DAILY  . meloxicam (MOBIC) 15 MG tablet Take 15 mg by mouth daily.  . Menthol, Topical Analgesic, (ICY HOT EX) Apply 1 application topically daily as needed (pain).  . methocarbamol (ROBAXIN) 500 MG tablet Take 1 tablet (500 mg total) by mouth every 6 (six) hours as needed for muscle spasms.  . Multiple Vitamin (MULTIVITAMIN) tablet Take 1 tablet by mouth daily.  . pantoprazole (PROTONIX) 40 MG tablet Take 1 tablet (40 mg total) by mouth daily as needed. (Patient taking differently: Take 40 mg by mouth daily as needed (heartburn). )  . HYDROcodone-acetaminophen (NORCO/VICODIN) 5-325 MG tablet Take 1 tablet by mouth every 4 (four) hours as needed for moderate pain (pain score 4-6). (Patient not taking: Reported on 09/18/2019)   No facility-administered encounter medications on file as of 09/18/2019.    Allergies (verified) Patient has no known allergies.   History: Past Medical History:  Diagnosis Date  . Arthritis   . Benign tumor of pituitary gland ([place])   . Chronic kidney disease   . ED (erectile dysfunction)   . Erectile dysfunction   . GERD (gastroesophageal reflux disease)   . High risk sexual behavior   . Hyperlipidemia   .  Hypertension   . Pneumonia 2012  . Tobacco use disorder, continuous    Past Surgical History:  Procedure Laterality Date  . COLONOSCOPY  2015  . LIPOMA EXCISION Left 10/04/2018   Procedure: EXCISION LIPOMA LEFT HIP;  Surgeon: Hessie Knows, MD;  Location: [facility] ORS;  Service: Orthopedics;  Laterality: Left;  . PITUITARY SURGERY  12/10/2015  . TOTAL HIP ARTHROPLASTY Right 09/09/2017    Procedure: TOTAL HIP ARTHROPLASTY ANTERIOR APPROACH;  Surgeon: Hessie Knows, MD;  Location: [facility] ORS;  Service: Orthopedics;  Laterality: Right;  . TOTAL HIP ARTHROPLASTY Left 10/04/2018   Procedure: TOTAL HIP ARTHROPLASTY ANTERIOR APPROACH;  Surgeon: Hessie Knows, MD;  Location: [facility] ORS;  Service: Orthopedics;  Laterality: Left;   Family History  Problem Relation Age of Onset  . Brain cancer Mother   . Cancer Mother        Brain tumor  . Thyroid disease Mother   . Lung disease Mother        From [place]  . Heart disease Father   . Stroke Father   . Hypertension Brother   . Aneurysm Brother   . Pneumonia Brother   . Heart disease Sister        massive MI  . Obesity Sister    Social History   Socioeconomic History  . Marital status: Divorced    Spouse name: Not on file  . Number of children: 4  . Years of education: 12th  . Highest education level: High school graduate  Occupational History  . Occupation: Retired Visual merchandiser  Tobacco Use  . Smoking status: Current Every Day Smoker    Packs/day: 0.25    Years: 40.00    Pack years: 10.00    Types: Cigarettes  . Smokeless tobacco: Never Used  . Tobacco comment: 2-3 cig daily  Vaping Use  . Vaping Use: Never used  Substance and Sexual Activity  . Alcohol use: Not Currently  . Drug use: No  . Sexual activity: Yes    Partners: Female  Other Topics Concern  . Not on file  Social History Narrative   ** Merged History Encounter **       Social Determinants of Health   Financial Resource Strain: Low Risk   . Difficulty of Paying Living Expenses: Not hard at all  Food Insecurity: No Food Insecurity  . Worried About Charity fundraiser in the Last Year: Never true  . Ran Out of Food in the Last Year: Never true  Transportation Needs: No Transportation Needs  . Lack of Transportation (Medical): No  . Lack of Transportation (Non-Medical): No  Physical Activity: Insufficiently Active  . Days of Exercise per Week: 2  days  . Minutes of Exercise per Session: 60 min  Stress: No Stress Concern Present  . Feeling of Stress : Not at all  Social Connections:   . Frequency of Communication with Friends and Family: Not on file  . Frequency of Social Gatherings with Friends and Family: Not on file  . Attends Religious Services: Not on file  . Active Member of Clubs or Organizations: Not on file  . Attends Archivist Meetings: Not on file  . Marital Status: Not on file    Tobacco Counseling Ready to quit: Yes Counseling given: Not Answered Comment: 2-3 cig daily   Clinical Intake:  Pre-visit preparation completed: Yes  Pain : No/denies pain     Nutritional Status: BMI 25 -29 Overweight Nutritional Risks: None Diabetes: No  How often  do you need to have someone help you when you read instructions, pamphlets, or other written materials from your doctor or pharmacy?: 1 - Never What is the last grade level you completed in school?: 12th grade  Diabetic? no  Interpreter Needed?: No  Information entered by :: NAllen LPN   Activities of Daily Living In your present state of health, do you have any difficulty performing the following activities: 09/18/2019 11/11/2018  Hearing? N N  Vision? Y N  Comment right eye cataract -  Difficulty concentrating or making decisions? N N  Walking or climbing stairs? N N  Comment - -  Dressing or bathing? N N  Doing errands, shopping? N N  Preparing Food and eating ? N -  Using the Toilet? N -  In the past six months, have you accidently leaked urine? N -  Do you have problems with loss of bowel control? N -  Managing your Medications? N -  Managing your Finances? N -  Housekeeping or managing your Housekeeping? N -  Some recent data might be hidden    Patient Care Team: Volney American, PA-C as PCP - General (Family Medicine) Kate Sable, MD as PCP - Cardiology (Cardiology) Derrill Memo, MD as Referring Physician  (Neurosurgery) Greg Cutter, LCSW as [place] Management (Licensed Clinical Social Worker) Minor, Dalbert Garnet, RN (Inactive) as [place] any recent [place] you may have received from other than [facility] providers in the past year (date may be approximate).     Assessment:   This is a routine wellness examination for Joie.  Hearing/Vision screen  Hearing Screening   125Hz  250Hz  500Hz  1000Hz  2000Hz  3000Hz  4000Hz  6000Hz  8000Hz   Right ear:           Left ear:           Vision Screening Comments: Regular eye exams, [place]  Dietary issues and exercise activities discussed: Current Exercise Habits: Home exercise routine, Type of exercise: walking, Time (Minutes): 60, Frequency (Times/Week): 2, Weekly Exercise (Minutes/Week): 120  Goals    . Patient Stated     09/18/2019, wants to get down to 200 pounds    . Quit Smoking     Smoking cessation discussed    . Quit smoking / using tobacco     Smoking cessation discussed       Depression Screen PHQ 2/9 Scores 09/18/2019 11/11/2018 08/18/2018 08/20/2017 08/05/2017 07/29/2016 02/14/2016  PHQ - 2 Score 0 0 0 0 0 0 0  PHQ- 9 Score - - - 0 - - -    Fall Risk Fall Risk  09/18/2019 08/18/2018 04/20/2018 08/05/2017 07/29/2016  Falls in the past year? 0 0 0 No No  Risk for fall due to : Impaired balance/gait;Medication side effect - - - -  Follow up Falls evaluation completed;Education provided;Falls prevention discussed - Falls evaluation completed - -    Any stairs in or around the home? Yes  If so, are there any without handrails? Yes  Home free of loose throw rugs in walkways, pet beds, electrical cords, etc? Yes  Adequate lighting in your home to reduce risk of falls? Yes   ASSISTIVE DEVICES UTILIZED TO PREVENT FALLS:  Life alert? No  Use of a cane, walker or w/c? No  Grab bars in the bathroom? Yes  Shower chair or bench in shower? Yes  Elevated toilet seat or a  handicapped toilet? Yes   TIMED UP AND  GO:  Was the test performed? No .     Cognitive Function:     6CIT Screen 09/18/2019 08/05/2017 07/29/2016  What Year? 0 points 0 points 0 points  What month? 0 points 0 points 0 points  What time? 0 points 0 points 0 points  Count back from 20 2 points 0 points 0 points  Months in reverse 4 points 0 points 0 points  Repeat phrase 0 points 2 points 6 points  Total Score 6 2 6     Immunizations Immunization History  Administered Date(s) Administered  . Influenza, High Dose Seasonal PF 10/26/2016, 10/19/2017, 09/19/2018  . Influenza-Unspecified 10/15/2015  . Moderna SARS-COVID-2 Vaccination 03/11/2019, 04/15/2019  . Pneumococcal Conjugate-13 12/13/2013  . Pneumococcal Polysaccharide-23 04/10/2008  . Pneumococcal-Unspecified 04/10/2008  . Td 10/05/2005, 10/05/2005, 08/19/2016  . Zoster 04/10/2008    TDAP status: Up to date Flu Vaccine status: Up to date Pneumococcal vaccine status: Up to date Covid-19 vaccine status: Completed vaccines  Qualifies for Shingles Vaccine? Yes   Zostavax completed Yes   Shingrix Completed?: No.    Education has been provided regarding the importance of this vaccine. Patient has been advised to call insurance company to determine out of pocket expense if they have not yet received this vaccine. Advised may also receive vaccine at local pharmacy or Health Dept. Verbalized acceptance and understanding.  Screening Tests Health Maintenance  Topic Date Due  . INFLUENZA VACCINE  08/06/2019  . TETANUS/TDAP  08/20/2026  . COVID-19 Vaccine  Completed  . Hepatitis C Screening  Completed  . PNA vac Low Risk Adult  Completed    Health Maintenance  Health Maintenance Due  Topic Date Due  . INFLUENZA VACCINE  08/06/2019    Colorectal cancer screening: No longer required.   Lung Cancer Screening: (Low Dose CT Chest recommended if Age 29-80 years, 30 pack-year currently smoking OR have quit w/in 15years.) does not  qualify.   Lung Cancer Screening Referral: no  Additional Screening:  Hepatitis C Screening: does qualify; Completed 03/01/2017  Vision Screening: Recommended annual ophthalmology exams for early detection of glaucoma and other disorders of the eye. Is the patient up to date with their annual eye exam?  Yes  Who is the provider or what is the name of the office in which the patient attends annual eye exams? [facility] If pt is not established with a provider, would they like to be referred to a provider to establish care? No .   Dental Screening: Recommended annual dental exams for proper oral hygiene  Community Resource Referral / Chronic Care Management: CRR required this visit?  No   CCM required this visit?  No      Plan:     I have personally reviewed and noted the following in the patient's chart:   . Medical and social history . Use of alcohol, tobacco or illicit drugs  . Current medications and supplements . Functional ability and status . Nutritional status . Physical activity . Advanced directives . List of other physicians . Hospitalizations, surgeries, and ER visits in previous 12 months . Vitals . Screenings to include cognitive, depression, and falls . Referrals and appointments  In addition, I have reviewed and discussed with patient certain preventive protocols, quality metrics, and best practice recommendations. A written personalized care plan for preventive services as well as general preventive health recommendations were provided to patient.     Kellie Simmering, LPN   08/22/5629   Nurse Notes: Patient working on  quitting smoking.

## 2019-10-10 ENCOUNTER — Other Ambulatory Visit: Payer: Self-pay

## 2019-10-10 MED ORDER — LISINOPRIL 5 MG PO TABS: 5.0000 mg | ORAL_TABLET | Freq: Every day | ORAL | 0 refills | Status: DC

## 2019-12-11 DIAGNOSIS — R69 Illness, unspecified: Secondary | ICD-10-CM | POA: Diagnosis not present

## 2019-12-14 ENCOUNTER — Other Ambulatory Visit: Payer: Self-pay

## 2019-12-14 ENCOUNTER — Encounter: Payer: Self-pay | Admitting: Family Medicine

## 2019-12-14 ENCOUNTER — Encounter: Payer: Medicare HMO | Admitting: Family Medicine

## 2019-12-14 ENCOUNTER — Ambulatory Visit (INDEPENDENT_AMBULATORY_CARE_PROVIDER_SITE_OTHER): Payer: Medicare HMO | Admitting: Family Medicine

## 2019-12-14 VITALS — BP 136/75 | HR 54 | Temp 98.3°F | Ht 70.0 in | Wt 214.0 lb

## 2019-12-14 DIAGNOSIS — I129 Hypertensive chronic kidney disease with stage 1 through stage 4 chronic kidney disease, or unspecified chronic kidney disease: Secondary | ICD-10-CM | POA: Diagnosis not present

## 2019-12-14 DIAGNOSIS — R8281 Pyuria: Secondary | ICD-10-CM | POA: Diagnosis not present

## 2019-12-14 DIAGNOSIS — K219 Gastro-esophageal reflux disease without esophagitis: Secondary | ICD-10-CM | POA: Diagnosis not present

## 2019-12-14 DIAGNOSIS — D649 Anemia, unspecified: Secondary | ICD-10-CM | POA: Diagnosis not present

## 2019-12-14 DIAGNOSIS — N183 Chronic kidney disease, stage 3 unspecified: Secondary | ICD-10-CM | POA: Diagnosis not present

## 2019-12-14 DIAGNOSIS — R972 Elevated prostate specific antigen [PSA]: Secondary | ICD-10-CM | POA: Diagnosis not present

## 2019-12-14 DIAGNOSIS — E782 Mixed hyperlipidemia: Secondary | ICD-10-CM | POA: Diagnosis not present

## 2019-12-14 DIAGNOSIS — D352 Benign neoplasm of pituitary gland: Secondary | ICD-10-CM

## 2019-12-14 DIAGNOSIS — Z0000 Encounter for general adult medical examination without abnormal findings: Secondary | ICD-10-CM

## 2019-12-14 LAB — URINALYSIS, ROUTINE W REFLEX MICROSCOPIC
Bilirubin, UA: NEGATIVE
Glucose, UA: NEGATIVE
Ketones, UA: NEGATIVE
Nitrite, UA: NEGATIVE
Protein,UA: NEGATIVE
Specific Gravity, UA: 1.025 (ref 1.005–1.030)
Urobilinogen, Ur: 0.2 mg/dL (ref 0.2–1.0)
pH, UA: 5.5 (ref 5.0–7.5)

## 2019-12-14 LAB — MICROSCOPIC EXAMINATION: Bacteria, UA: NONE SEEN

## 2019-12-14 LAB — MICROALBUMIN, URINE WAIVED
Creatinine, Urine Waived: 300 mg/dL (ref 10–300)
Microalb, Ur Waived: 30 mg/L — ABNORMAL HIGH (ref 0–19)
Microalb/Creat Ratio: <30 mg/g (ref <30)

## 2019-12-14 MED ORDER — LISINOPRIL 5 MG PO TABS
5.0000 mg | ORAL_TABLET | Freq: Every day | ORAL | 1 refills | Status: DC
Start: 2019-12-14 — End: 2020-03-19

## 2019-12-14 MED ORDER — FLUTICASONE PROPIONATE 50 MCG/ACT NA SUSP
2.0000 | Freq: Every day | NASAL | 3 refills | Status: DC
Start: 2019-12-14 — End: 2020-03-19

## 2019-12-14 MED ORDER — ATORVASTATIN CALCIUM 10 MG PO TABS
10.0000 mg | ORAL_TABLET | Freq: Every day | ORAL | 2 refills | Status: DC
Start: 2019-12-14 — End: 2020-03-19

## 2019-12-14 MED ORDER — ALBUTEROL SULFATE HFA 108 (90 BASE) MCG/ACT IN AERS: 2.0000 | INHALATION_SPRAY | Freq: Four times a day (QID) | RESPIRATORY_TRACT | 3 refills | Status: DC | PRN

## 2019-12-14 MED ORDER — PANTOPRAZOLE SODIUM 40 MG PO TBEC
40.0000 mg | DELAYED_RELEASE_TABLET | Freq: Every day | ORAL | 3 refills | Status: DC | PRN
Start: 2019-12-14 — End: 2020-03-19

## 2019-12-14 NOTE — Assessment & Plan Note (Signed)
Rechecking labs today. Await results. Treat as needed.  °

## 2019-12-14 NOTE — Patient Instructions (Signed)
Health Maintenance After Age 77 After age 77, you are at a higher risk for certain long-term diseases and infections as well as injuries from falls. Falls are a major cause of broken bones and head injuries in people who are older than age 77. Getting regular preventive care can help to keep you healthy and well. Preventive care includes getting regular testing and making lifestyle changes as recommended by your health care provider. Talk with your health care provider about:  Which screenings and tests you should have. A screening is a test that checks for a disease when you have no symptoms.  A diet and exercise plan that is right for you. What should I know about screenings and tests to prevent falls? Screening and testing are the best ways to find a health problem early. Early diagnosis and treatment give you the best chance of managing medical conditions that are common after age 77. Certain conditions and lifestyle choices may make you more likely to have a fall. Your health care provider may recommend:  Regular vision checks. Poor vision and conditions such as cataracts can make you more likely to have a fall. If you wear glasses, make sure to get your prescription updated if your vision changes.  Medicine review. Work with your health care provider to regularly review all of the medicines you are taking, including over-the-counter medicines. Ask your health care provider about any side effects that may make you more likely to have a fall. Tell your health care provider if any medicines that you take make you feel dizzy or sleepy.  Osteoporosis screening. Osteoporosis is a condition that causes the bones to get weaker. This can make the bones weak and cause them to break more easily.  Blood pressure screening. Blood pressure changes and medicines to control blood pressure can make you feel dizzy.  Strength and balance checks. Your health care provider may recommend certain tests to check your  strength and balance while standing, walking, or changing positions.  Foot health exam. Foot pain and numbness, as well as not wearing proper footwear, can make you more likely to have a fall.  Depression screening. You may be more likely to have a fall if you have a fear of falling, feel emotionally low, or feel unable to do activities that you used to do.  Alcohol use screening. Using too much alcohol can affect your balance and may make you more likely to have a fall. What actions can I take to lower my risk of falls? General instructions  Talk with your health care provider about your risks for falling. Tell your health care provider if: ? You fall. Be sure to tell your health care provider about all falls, even ones that seem minor. ? You feel dizzy, sleepy, or off-balance.  Take over-the-counter and prescription medicines only as told by your health care provider. These include any supplements.  Eat a healthy diet and maintain a healthy weight. A healthy diet includes low-fat dairy products, low-fat (lean) meats, and fiber from whole grains, beans, and lots of fruits and vegetables. Home safety  Remove any tripping hazards, such as rugs, cords, and clutter.  Install safety equipment such as grab bars in bathrooms and safety rails on stairs.  Keep rooms and walkways well-lit. Activity   Follow a regular exercise program to stay fit. This will help you maintain your balance. Ask your health care provider what types of exercise are appropriate for you.  If you need a cane or   walker, use it as recommended by your health care provider.  Wear supportive shoes that have nonskid soles. Lifestyle  Do not drink alcohol if your health care provider tells you not to drink.  If you drink alcohol, limit how much you have: ? 0-1 drink a day for women. ? 0-2 drinks a day for men.  Be aware of how much alcohol is in your drink. In the [place] one drink equals one typical bottle of beer (12  oz), one-half glass of wine (5 oz), or one shot of hard liquor (1 oz).  Do not use any products that contain nicotine or tobacco, such as cigarettes and e-cigarettes. If you need help quitting, ask your health care provider. Summary  Having a healthy lifestyle and getting preventive care can help to protect your health and wellness after age 77.  Screening and testing are the best way to find a health problem early and help you avoid having a fall. Early diagnosis and treatment give you the best chance for managing medical conditions that are more common for people who are older than age 77.  Falls are a major cause of broken bones and head injuries in people who are older than age 77. Take precautions to prevent a fall at home.  Work with your health care provider to learn what changes you can make to improve your health and wellness and to prevent falls. This information is not intended to replace advice given to you by your health care provider. Make sure you discuss any questions you have with your health care provider. Document Revised: 04/14/2018 Document Reviewed: 11/04/2016 Elsevier Patient Education  2020 Elsevier Inc.  

## 2019-12-14 NOTE — Addendum Note (Signed)
Addended by: Georgina Peer on: 12/14/2019 11:35 AM   Modules accepted: Orders

## 2019-12-14 NOTE — Assessment & Plan Note (Signed)
Under good control on current regimen. Continue current regimen. Continue to monitor. Call with any concerns. Refills given. Labs drawn today.   

## 2019-12-14 NOTE — Progress Notes (Signed)
BP 136/75   Pulse (!) 54   Temp 98.3 F (36.8 C)   Ht 5\' 10"  (1.778 m)   Wt 214 lb (97.1 kg)   SpO2 98%   BMI 30.71 kg/m    Subjective:    Patient ID: Matthew Randolph, male    DOB: [birth year], 79 y.o.   MRN: [id number]  HPI: Matthew Randolph is a 79 y.o. male presenting on 12/14/2019 for comprehensive medical examination. Current medical complaints include:  HYPERTENSION / HYPERLIPIDEMIA Satisfied with current treatment? yes Duration of hypertension: chronic BP monitoring frequency: not checking BP medication side effects: no Past BP meds: lisinopril Duration of hyperlipidemia: chronic Cholesterol medication side effects: no Cholesterol supplements: none Past cholesterol medications: atorvastatin Medication compliance: excellent compliance Aspirin: no Recent stressors: no Recurrent headaches: no Visual changes: no Palpitations: no Dyspnea: no Chest pain: no Lower extremity edema: no Dizzy/lightheaded: no  Interim Problems from his last visit: no  Depression Screen done today and results listed below:  Depression screen [facility] 2/9 12/14/2019 09/18/2019 11/11/2018 08/18/2018 08/20/2017  Decreased Interest 0 0 0 0 0  Down, Depressed, Hopeless 0 0 0 0 0  PHQ - 2 Score 0 0 0 0 0  Altered sleeping - - - - 0  Tired, decreased energy - - - - 0  Change in appetite - - - - 0  Feeling bad or failure about yourself  - - - - 0  Trouble concentrating - - - - 0  Moving slowly or fidgety/restless - - - - 0  Suicidal thoughts - - - - 0  PHQ-9 Score - - - - 0     Past Medical History:  Past Medical History:  Diagnosis Date  . Arthritis   . Benign tumor of pituitary gland ([place])   . Chronic kidney disease   . ED (erectile dysfunction)   . Erectile dysfunction   . GERD (gastroesophageal reflux disease)   . High risk sexual behavior   . Hyperlipidemia   . Hypertension   . Pneumonia 2012  . Tobacco use disorder, continuous     Surgical History:  Past Surgical History:   Procedure Laterality Date  . COLONOSCOPY  2015  . LIPOMA EXCISION Left 10/04/2018   Procedure: EXCISION LIPOMA LEFT HIP;  Surgeon: Hessie Knows, MD;  Location: [facility] ORS;  Service: Orthopedics;  Laterality: Left;  . PITUITARY SURGERY  12/10/2015  . TOTAL HIP ARTHROPLASTY Right 09/09/2017   Procedure: TOTAL HIP ARTHROPLASTY ANTERIOR APPROACH;  Surgeon: Hessie Knows, MD;  Location: [facility] ORS;  Service: Orthopedics;  Laterality: Right;  . TOTAL HIP ARTHROPLASTY Left 10/04/2018   Procedure: TOTAL HIP ARTHROPLASTY ANTERIOR APPROACH;  Surgeon: Hessie Knows, MD;  Location: [facility] ORS;  Service: Orthopedics;  Laterality: Left;    Medications:  Current Outpatient Medications on File Prior to Visit  Medication Sig  . meloxicam (MOBIC) 15 MG tablet Take 15 mg by mouth daily.  . Menthol, Topical Analgesic, (ICY HOT EX) Apply 1 application topically daily as needed (pain).  . methocarbamol (ROBAXIN) 500 MG tablet Take 1 tablet (500 mg total) by mouth every 6 (six) hours as needed for muscle spasms.  . Multiple Vitamin (MULTIVITAMIN) tablet Take 1 tablet by mouth daily.  Marland Kitchen HYDROcodone-acetaminophen (NORCO/VICODIN) 5-325 MG tablet Take 1 tablet by mouth every 4 (four) hours as needed for moderate pain (pain score 4-6). (Patient not taking: No sig reported)   No current facility-administered medications on file prior to visit.    Allergies:  No Known  Allergies  Social History:  Social History   Socioeconomic History  . Marital status: Divorced    Spouse name: Not on file  . Number of children: 4  . Years of education: 12th  . Highest education level: High school graduate  Occupational History  . Occupation: Retired Visual merchandiser  Tobacco Use  . Smoking status: Current Every Day Smoker    Packs/day: 0.25    Years: 40.00    Pack years: 10.00    Types: Cigarettes  . Smokeless tobacco: Never Used  . Tobacco comment: 2-3 cig daily  Vaping Use  . Vaping Use: Never used  Substance and Sexual  Activity  . Alcohol use: Not Currently  . Drug use: No  . Sexual activity: Yes    Partners: Female  Other Topics Concern  . Not on file  Social History Narrative   ** Merged History Encounter **       Social Determinants of Health   Financial Resource Strain: Low Risk   . Difficulty of Paying Living Expenses: Not hard at all  Food Insecurity: No Food Insecurity  . Worried About Charity fundraiser in the Last Year: Never true  . Ran Out of Food in the Last Year: Never true  Transportation Needs: No Transportation Needs  . Lack of Transportation (Medical): No  . Lack of Transportation (Non-Medical): No  Physical Activity: Insufficiently Active  . Days of Exercise per Week: 2 days  . Minutes of Exercise per Session: 60 min  Stress: No Stress Concern Present  . Feeling of Stress : Not at all  Social Connections: Not on file  Intimate Partner Violence: Not on file   Social History   Tobacco Use  Smoking Status Current Every Day Smoker  . Packs/day: 0.25  . Years: 40.00  . Pack years: 10.00  . Types: Cigarettes  Smokeless Tobacco Never Used  Tobacco Comment   2-3 cig daily   Social History   Substance and Sexual Activity  Alcohol Use Not Currently    Family History:  Family History  Problem Relation Age of Onset  . Brain cancer Mother   . Cancer Mother        Brain tumor  . Thyroid disease Mother   . Lung disease Mother        From [place]  . Heart disease Father   . Stroke Father   . Hypertension Brother   . Aneurysm Brother   . Pneumonia Brother   . Heart disease Sister        massive MI  . Obesity Sister     Past medical history, surgical history, medications, allergies, family history and social history reviewed with patient today and changes made to appropriate areas of the chart.   Review of Systems  Constitutional: Negative.   HENT: Negative.   Eyes: Negative.   Respiratory: Negative.   Cardiovascular: Negative.   Gastrointestinal: Positive  for constipation and heartburn (with food choices). Negative for abdominal pain, blood in stool, diarrhea, melena, nausea and vomiting.  Genitourinary: Negative.   Musculoskeletal: Negative.   Skin: Negative.   Neurological: Negative.   Endo/Heme/Allergies: Positive for environmental allergies. Negative for polydipsia. Does not bruise/bleed easily.  Psychiatric/Behavioral: Negative.     All other ROS negative except what is listed above and in the HPI.      Objective:    BP 136/75   Pulse (!) 54   Temp 98.3 F (36.8 C)   Ht 5\' 10"  (1.778 m)  Wt 214 lb (97.1 kg)   SpO2 98%   BMI 30.71 kg/m   Wt Readings from Last 3 Encounters:  12/14/19 214 lb (97.1 kg)  09/18/19 215 lb (97.5 kg)  06/12/19 216 lb (98 kg)    Physical Exam Vitals and nursing note reviewed.  Constitutional:      General: He is not in acute distress.    Appearance: Normal appearance. He is obese. He is not ill-appearing, toxic-appearing or diaphoretic.  HENT:     Head: Normocephalic and atraumatic.     Right Ear: Tympanic membrane, ear canal and external ear normal. There is no impacted cerumen.     Left Ear: Tympanic membrane, ear canal and external ear normal. There is no impacted cerumen.     Nose: Nose normal. No congestion or rhinorrhea.     Mouth/Throat:     Mouth: Mucous membranes are moist.     Pharynx: Oropharynx is clear. No oropharyngeal exudate or posterior oropharyngeal erythema.  Eyes:     General: No scleral icterus.       Right eye: No discharge.        Left eye: No discharge.     Extraocular Movements: Extraocular movements intact.     Conjunctiva/sclera: Conjunctivae normal.     Pupils: Pupils are equal, round, and reactive to light.  Neck:     Vascular: No carotid bruit.  Cardiovascular:     Rate and Rhythm: Normal rate and regular rhythm.     Pulses: Normal pulses.     Heart sounds: No murmur heard. No friction rub. No gallop.   Pulmonary:     Effort: Pulmonary effort is normal.  No respiratory distress.     Breath sounds: Normal breath sounds. No stridor. No wheezing, rhonchi or rales.  Chest:     Chest wall: No tenderness.  Abdominal:     General: Abdomen is flat. Bowel sounds are normal. There is no distension.     Palpations: Abdomen is soft. There is no mass.     Tenderness: There is no abdominal tenderness. There is no right CVA tenderness, left CVA tenderness, guarding or rebound.     Hernia: No hernia is present.  Genitourinary:    Comments: Genital exam deferred with shared decision making Musculoskeletal:        General: No swelling, tenderness, deformity or signs of injury.     Cervical back: Normal range of motion and neck supple. No rigidity. No muscular tenderness.     Right lower leg: No edema.     Left lower leg: No edema.  Lymphadenopathy:     Cervical: No cervical adenopathy.  Skin:    General: Skin is warm and dry.     Capillary Refill: Capillary refill takes less than 2 seconds.     Coloration: Skin is not jaundiced or pale.     Findings: No bruising, erythema, lesion or rash.  Neurological:     General: No focal deficit present.     Mental Status: He is alert and oriented to person, place, and time.     Cranial Nerves: No cranial nerve deficit.     Sensory: No sensory deficit.     Motor: No weakness.     Coordination: Coordination normal.     Gait: Gait normal.     Deep Tendon Reflexes: Reflexes normal.  Psychiatric:        Mood and Affect: Mood normal.        Behavior: Behavior normal.  Thought Content: Thought content normal.        Judgment: Judgment normal.     Results for orders placed or performed in visit on 06/12/19  Comprehensive metabolic panel  Result Value Ref Range   Glucose 106 (H) 65 - 99 mg/dL   BUN 11 8 - 27 mg/dL   Creatinine, Ser 0.99 0.76 - 1.27 mg/dL   GFR calc non Af Amer 74 >59 mL/min/1.73   GFR calc Af Amer 85 >59 mL/min/1.73   BUN/Creatinine Ratio 11 10 - 24   Sodium 138 134 - 144 mmol/L    Potassium 4.2 3.5 - 5.2 mmol/L   Chloride 104 96 - 106 mmol/L   CO2 21 20 - 29 mmol/L   Calcium 9.2 8.6 - 10.2 mg/dL   Total Protein 6.8 6.0 - 8.5 g/dL   Albumin 4.2 3.7 - 4.7 g/dL   Globulin, Total 2.6 1.5 - 4.5 g/dL   Albumin/Globulin Ratio 1.6 1.2 - 2.2   Bilirubin Total 0.4 0.0 - 1.2 mg/dL   Alkaline Phosphatase 62 48 - 121 IU/L   AST 23 0 - 40 IU/L   ALT 22 0 - 44 IU/L  Lipid Panel w/o Chol/HDL Ratio  Result Value Ref Range   Cholesterol, Total 156 100 - 199 mg/dL   Triglycerides 76 0 - 149 mg/dL   HDL 60 >39 mg/dL   VLDL Cholesterol Cal 15 5 - 40 mg/dL   LDL Chol Calc (NIH) 81 0 - 99 mg/dL      Assessment & Plan:   Problem List Items Addressed This Visit      Digestive   GERD (gastroesophageal reflux disease)    Under good control on current regimen. Continue current regimen. Continue to monitor. Call with any concerns. Refills given. Labs drawn today.        Relevant Medications   pantoprazole (PROTONIX) 40 MG tablet   Other Relevant Orders   Comprehensive metabolic panel   CBC with Differential/Platelet     Endocrine   Pituitary adenoma ([place])    Under good control on current regimen. Continue current regimen. Continue to monitor. Call with any concerns. Refills given. Labs drawn today.        Relevant Orders   Comprehensive metabolic panel   CBC with Differential/Platelet   Prolactin     Genitourinary   Hypertensive renal disease    Under good control on current regimen. Continue current regimen. Continue to monitor. Call with any concerns. Refills given. Labs drawn today.        Relevant Orders   Comprehensive metabolic panel   CBC with Differential/Platelet   TSH   Microalbumin, Urine Waived   CKD (chronic kidney disease)    Rechecking labs today. Await results. Treat as needed.       Relevant Orders   Comprehensive metabolic panel   CBC with Differential/Platelet   Urinalysis, Routine w reflex microscopic     Other   Hyperlipemia     Under good control on current regimen. Continue current regimen. Continue to monitor. Call with any concerns. Refills given. Labs drawn today.        Relevant Medications   lisinopril (ZESTRIL) 5 MG tablet   atorvastatin (LIPITOR) 10 MG tablet   Other Relevant Orders   Comprehensive metabolic panel   CBC with Differential/Platelet   Lipid Panel w/o Chol/HDL Ratio   Elevated PSA    Rechecking labs today. Await results. Treat as needed.       Relevant Orders  Comprehensive metabolic panel   CBC with Differential/Platelet   PSA   Urinalysis, Routine w reflex microscopic   Anemia    Rechecking labs today. Await results. Treat as needed.       Relevant Orders   Comprehensive metabolic panel   CBC with Differential/Platelet    Other Visit Diagnoses    Routine general medical examination at a health care facility    -  Primary   To get vaccines at [place]. Screening labs checked today. Continue diet and exercise. Call with any concerns.        Discussed aspirin prophylaxis for myocardial infarction prevention and decision was made to continue ASA  LABORATORY TESTING:  Health maintenance labs ordered today as discussed above.   The natural history of prostate cancer and ongoing controversy regarding screening and potential treatment outcomes of prostate cancer has been discussed with the patient. The meaning of a false positive PSA and a false negative PSA has been discussed. He indicates understanding of the limitations of this screening test and wishes to proceed with screening PSA testing.   IMMUNIZATIONS:   - Tdap: Tetanus vaccination status reviewed: last tetanus booster within 10 years. - Influenza: Given elsewhere - Pneumovax: Up to date - Prevnar: Up to date - COVID: Given elsewhere  SCREENING: - Colonoscopy: Not applicable  Discussed with patient purpose of the colonoscopy is to detect colon cancer at curable precancerous or early stages   PATIENT COUNSELING:     Sexuality: Discussed sexually transmitted diseases, partner selection, use of condoms, avoidance of unintended pregnancy  and contraceptive alternatives.   Advised to avoid cigarette smoking.  I discussed with the patient that most people either abstain from alcohol or drink within safe limits (<=14/week and <=4 drinks/occasion for males, <=7/weeks and <= 3 drinks/occasion for females) and that the risk for alcohol disorders and other health effects rises proportionally with the number of drinks per week and how often a drinker exceeds daily limits.  Discussed cessation/primary prevention of drug use and availability of treatment for abuse.   Diet: Encouraged to adjust caloric intake to maintain  or achieve ideal body weight, to reduce intake of dietary saturated fat and total fat, to limit sodium intake by avoiding high sodium foods and not adding table salt, and to maintain adequate dietary potassium and calcium preferably from fresh fruits, vegetables, and low-fat dairy products.    stressed the importance of regular exercise  Injury prevention: Discussed safety belts, safety helmets, smoke detector, smoking near bedding or upholstery.   Dental health: Discussed importance of regular tooth brushing, flossing, and dental visits.   Follow up plan: NEXT PREVENTATIVE PHYSICAL DUE IN 1 YEAR. Return in about 6 months (around 06/13/2020).

## 2019-12-15 LAB — COMPREHENSIVE METABOLIC PANEL
ALT: 23 IU/L (ref 0–44)
AST: 22 IU/L (ref 0–40)
Albumin/Globulin Ratio: 1.5 (ref 1.2–2.2)
Albumin: 4.2 g/dL (ref 3.7–4.7)
Alkaline Phosphatase: 60 IU/L (ref 44–121)
BUN/Creatinine Ratio: 9 — ABNORMAL LOW (ref 10–24)
BUN: 10 mg/dL (ref 8–27)
Bilirubin Total: 0.6 mg/dL (ref 0.0–1.2)
CO2: 20 mmol/L (ref 20–29)
Calcium: 9.5 mg/dL (ref 8.6–10.2)
Chloride: 107 mmol/L — ABNORMAL HIGH (ref 96–106)
Creatinine, Ser: 1.11 mg/dL (ref 0.76–1.27)
GFR calc Af Amer: 74 mL/min/{1.73_m2} (ref >59)
GFR calc non Af Amer: 64 mL/min/{1.73_m2} (ref >59)
Globulin, Total: 2.8 g/dL (ref 1.5–4.5)
Glucose: 117 mg/dL — ABNORMAL HIGH (ref 65–99)
Potassium: 4.4 mmol/L (ref 3.5–5.2)
Sodium: 141 mmol/L (ref 134–144)
Total Protein: 7 g/dL (ref 6.0–8.5)

## 2019-12-15 LAB — LIPID PANEL W/O CHOL/HDL RATIO
Cholesterol, Total: 179 mg/dL (ref 100–199)
HDL: 59 mg/dL (ref >39)
LDL Chol Calc (NIH): 105 mg/dL — ABNORMAL HIGH (ref 0–99)
Triglycerides: 81 mg/dL (ref 0–149)
VLDL Cholesterol Cal: 15 mg/dL (ref 5–40)

## 2019-12-15 LAB — CBC WITH DIFFERENTIAL/PLATELET
Basophils Absolute: 0.1 10*3/uL (ref 0.0–0.2)
Basos: 1 %
EOS (ABSOLUTE): 0.1 10*3/uL (ref 0.0–0.4)
Eos: 1 %
Hematocrit: 40.4 % (ref 37.5–51.0)
Hemoglobin: 13.3 g/dL (ref 13.0–17.7)
Immature Grans (Abs): 0 10*3/uL (ref 0.0–0.1)
Immature Granulocytes: 0 %
Lymphocytes Absolute: 3.1 10*3/uL (ref 0.7–3.1)
Lymphs: 49 %
MCH: 28.2 pg (ref 26.6–33.0)
MCHC: 32.9 g/dL (ref 31.5–35.7)
MCV: 86 fL (ref 79–97)
Monocytes Absolute: 0.6 10*3/uL (ref 0.1–0.9)
Monocytes: 9 %
Neutrophils Absolute: 2.5 10*3/uL (ref 1.4–7.0)
Neutrophils: 40 %
Platelets: 301 10*3/uL (ref 150–450)
RBC: 4.71 x10E6/uL (ref 4.14–5.80)
RDW: 12.2 % (ref 11.6–15.4)
WBC: 6.3 10*3/uL (ref 3.4–10.8)

## 2019-12-15 LAB — PROLACTIN: Prolactin: 17.6 ng/mL — ABNORMAL HIGH (ref 4.0–15.2)

## 2019-12-15 LAB — TSH: TSH: 1.5 u[IU]/mL (ref 0.450–4.500)

## 2019-12-15 LAB — PSA: Prostate Specific Ag, Serum: 2.2 ng/mL (ref 0.0–4.0)

## 2019-12-17 LAB — URINE CULTURE: Organism ID, Bacteria: NO GROWTH

## 2019-12-26 DIAGNOSIS — H2513 Age-related nuclear cataract, bilateral: Secondary | ICD-10-CM | POA: Diagnosis not present

## 2020-01-18 IMAGING — DX DG HIP (WITH OR WITHOUT PELVIS) 2-3V*R*
2 series · 2 of 2 positions shown · non-contrast
Comparison: None.

CLINICAL DATA: Status post right total hip replacement.

EXAM:
DG HIP (WITH OR WITHOUT PELVIS) 2-3V RIGHT

[hip ap]
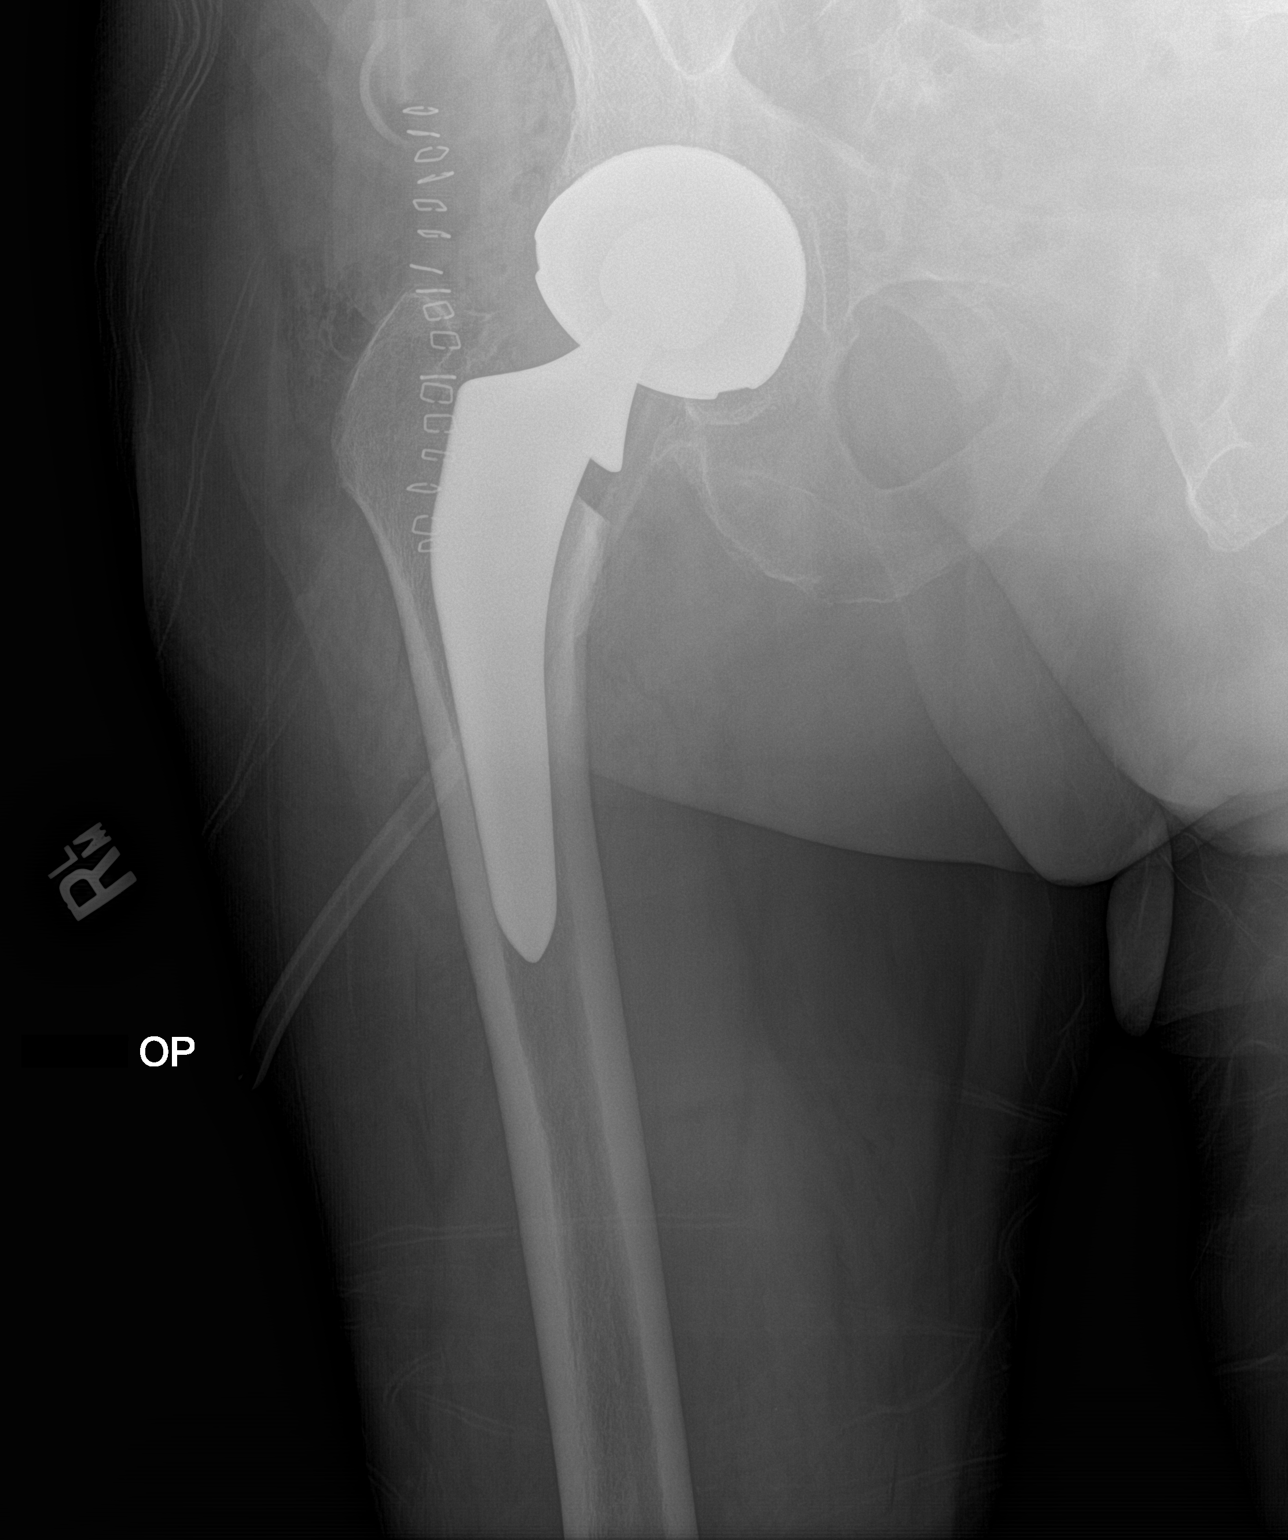

[hip lat]
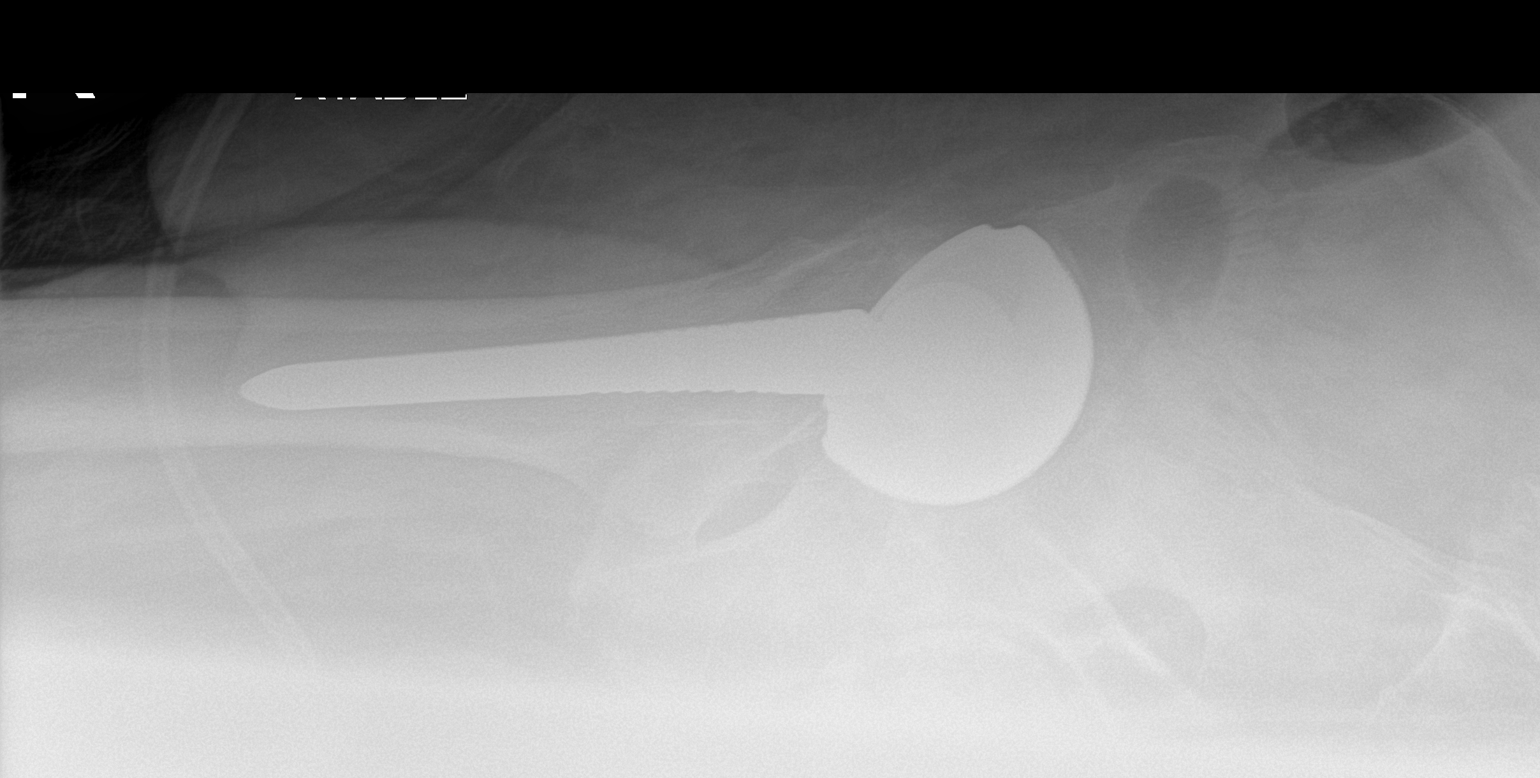

[2 of 2 positions shown; findings below may reference images not displayed]

FINDINGS: Right hip prosthesis in satisfactory position and alignment. No
fracture or dislocation seen.
IMPRESSION: Satisfactory postoperative appearance of a right hip prosthesis.

## 2020-03-19 ENCOUNTER — Other Ambulatory Visit: Payer: Self-pay | Admitting: Nurse Practitioner

## 2020-03-19 MED ORDER — ATORVASTATIN CALCIUM 10 MG PO TABS: 10.0000 mg | ORAL_TABLET | Freq: Every day | ORAL | 0 refills | Status: DC

## 2020-03-19 MED ORDER — PANTOPRAZOLE SODIUM 40 MG PO TBEC: 40.0000 mg | DELAYED_RELEASE_TABLET | Freq: Every day | ORAL | 2 refills | Status: DC | PRN

## 2020-03-19 MED ORDER — ALBUTEROL SULFATE HFA 108 (90 BASE) MCG/ACT IN AERS: 2.0000 | INHALATION_SPRAY | Freq: Four times a day (QID) | RESPIRATORY_TRACT | 3 refills | Status: DC | PRN

## 2020-03-19 MED ORDER — FLUTICASONE PROPIONATE 50 MCG/ACT NA SUSP
2.0000 | Freq: Every day | NASAL | 3 refills | Status: DC
Start: 2020-03-19 — End: 2021-06-13

## 2020-03-19 MED ORDER — LISINOPRIL 5 MG PO TABS: 5.0000 mg | ORAL_TABLET | Freq: Every day | ORAL | 0 refills | Status: DC

## 2020-03-19 NOTE — Telephone Encounter (Signed)
Medication Refill - Medication:   pantoprazole (PROTONIX) 40 MG tablet   lisinopril (ZESTRIL) 5 MG tablet  fluticasone (FLONASE) 50 MCG/ACT nasal spray   atorvastatin (LIPITOR) 10 MG tablet  albuterol (VENTOLIN HFA) 108 (90 Base) MCG/ACT inhaler    Has the patient contacted their pharmacy? Yes.  Needs all meds sent to a mail order pharmacy.   Preferred Pharmacy (with phone number or street name):   [facility] [address] [place] [address]  Phone: [phone] Fax: [phone]    Agent: Please be advised that RX refills may take up to 3 business days. We ask that you follow-up with your pharmacy.

## 2020-03-19 NOTE — Telephone Encounter (Signed)
Patient is using mail order pharmacy- remainder of RF on current medications forwarded to mail order.

## 2020-06-04 ENCOUNTER — Other Ambulatory Visit: Payer: Self-pay | Admitting: Family Medicine

## 2020-08-19 ENCOUNTER — Ambulatory Visit (INDEPENDENT_AMBULATORY_CARE_PROVIDER_SITE_OTHER): Payer: Medicare HMO | Admitting: Family Medicine

## 2020-08-19 ENCOUNTER — Encounter: Payer: Self-pay | Admitting: Family Medicine

## 2020-08-19 ENCOUNTER — Telehealth: Payer: Self-pay | Admitting: Nurse Practitioner

## 2020-08-19 VITALS — BP 152/70 | HR 47 | Wt 210.0 lb

## 2020-08-19 DIAGNOSIS — R059 Cough, unspecified: Secondary | ICD-10-CM

## 2020-08-19 MED ORDER — PREDNISONE 50 MG PO TABS: 50.0000 mg | ORAL_TABLET | Freq: Every day | ORAL | 0 refills | Status: DC

## 2020-08-19 MED ORDER — BENZONATATE 200 MG PO CAPS: 200.0000 mg | ORAL_CAPSULE | Freq: Two times a day (BID) | ORAL | 0 refills | Status: DC | PRN

## 2020-08-19 MED ORDER — HYDROCOD POLST-CPM POLST ER 10-8 MG/5ML PO SUER: 5.0000 mL | Freq: Two times a day (BID) | ORAL | 0 refills | Status: DC | PRN

## 2020-08-19 NOTE — Telephone Encounter (Signed)
Routing to provider to advise.  

## 2020-08-19 NOTE — Telephone Encounter (Signed)
Pts wife is calling to let Dr. Wynetta Emery know that the COVID test is negative. Pt would like know does he still need to come to office for another COVID test Please advise CB- [phone] [phone]

## 2020-08-19 NOTE — Telephone Encounter (Signed)
Lab appt scheduled.

## 2020-08-19 NOTE — Progress Notes (Signed)
BP (!) 152/70   Pulse (!) 47   Wt 210 lb (95.3 kg)   BMI 30.13 kg/m    Subjective:    Patient ID: Matthew Randolph, male    DOB: January 16, 1940, 80 y.o.   MRN: [id number]  HPI: Matthew Randolph is a 80 y.o. male  Chief Complaint  Patient presents with   Cough    Pt states he has been having a cough and congestion for the last week    UPPER RESPIRATORY TRACT INFECTION Duration: 4-5 days Worst symptom: cough Fever: no Cough: no Shortness of breath: yes Wheezing: yes Chest pain: no Chest tightness: yes Chest congestion: no Nasal congestion: no Runny nose: no Post nasal drip: no Sneezing: no Sore throat: no Swollen glands: no Sinus pressure: no Headache: no Face pain: no Toothache: no Ear pain: no  Ear pressure: no  Eyes red/itching:no Eye drainage/crusting: no  Vomiting: no Rash: no Fatigue: yes Sick contacts: no Strep contacts: no  Context: worse Recurrent sinusitis: no Relief with OTC cold/cough medications: no  Treatments attempted: cold/sinus, mucinex, anti-histamine, pseudoephedrine, and cough syrup   Relevant past medical, surgical, family and social history reviewed and updated as indicated. Interim medical history since our last visit reviewed. Allergies and medications reviewed and updated.  Review of Systems  Constitutional:  Positive for fatigue. Negative for activity change, appetite change, chills, diaphoresis, fever and unexpected weight change.  HENT: Negative.    Respiratory:  Positive for cough, shortness of breath and wheezing. Negative for choking, chest tightness and stridor.   Cardiovascular: Negative.   Gastrointestinal: Negative.   Musculoskeletal: Negative.   Psychiatric/Behavioral: Negative.     Per HPI unless specifically indicated above     Objective:    BP (!) 152/70   Pulse (!) 47   Wt 210 lb (95.3 kg)   BMI 30.13 kg/m   Wt Readings from Last 3 Encounters:  08/19/20 210 lb (95.3 kg)  12/14/19 214 lb (97.1 kg)  09/18/19  215 lb (97.5 kg)    Physical Exam Vitals and nursing note reviewed.  Pulmonary:     Effort: Pulmonary effort is normal. No respiratory distress.     Comments: Speaking in full sentences Neurological:     Mental Status: He is alert.  Psychiatric:        Mood and Affect: Mood normal.        Behavior: Behavior normal.        Thought Content: Thought content normal.        Judgment: Judgment normal.    Results for orders placed or performed in visit on 12/14/19  Urine Culture   Specimen: Urine   UR  Result Value Ref Range   Urine Culture, Routine Final report    Organism ID, Bacteria No growth   Microscopic Examination   Urine  Result Value Ref Range   WBC, UA 0-5 0 - 5 /hpf   RBC 3-10 (A) 0 - 2 /hpf   Epithelial Cells (non renal) 0-10 0 - 10 /hpf   Bacteria, UA None seen None seen/Few  Comprehensive metabolic panel  Result Value Ref Range   Glucose 117 (H) 65 - 99 mg/dL   BUN 10 8 - 27 mg/dL   Creatinine, Ser 1.11 0.76 - 1.27 mg/dL   GFR calc non Af Amer 64 >59 mL/min/1.73   GFR calc Af Amer 74 >59 mL/min/1.73   BUN/Creatinine Ratio 9 (L) 10 - 24   Sodium 141 134 - 144 mmol/L  Potassium 4.4 3.5 - 5.2 mmol/L   Chloride 107 (H) 96 - 106 mmol/L   CO2 20 20 - 29 mmol/L   Calcium 9.5 8.6 - 10.2 mg/dL   Total Protein 7.0 6.0 - 8.5 g/dL   Albumin 4.2 3.7 - 4.7 g/dL   Globulin, Total 2.8 1.5 - 4.5 g/dL   Albumin/Globulin Ratio 1.5 1.2 - 2.2   Bilirubin Total 0.6 0.0 - 1.2 mg/dL   Alkaline Phosphatase 60 44 - 121 IU/L   AST 22 0 - 40 IU/L   ALT 23 0 - 44 IU/L  CBC with Differential/Platelet  Result Value Ref Range   WBC 6.3 3.4 - 10.8 x10E3/uL   RBC 4.71 4.14 - 5.80 x10E6/uL   Hemoglobin 13.3 13.0 - 17.7 g/dL   Hematocrit 40.4 37.5 - 51.0 %   MCV 86 79 - 97 fL   MCH 28.2 26.6 - 33.0 pg   MCHC 32.9 31.5 - 35.7 g/dL   RDW 12.2 11.6 - 15.4 %   Platelets 301 150 - 450 x10E3/uL   Neutrophils 40 Not Estab. %   Lymphs 49 Not Estab. %   Monocytes 9 Not Estab. %   Eos 1  Not Estab. %   Basos 1 Not Estab. %   Neutrophils Absolute 2.5 1.4 - 7.0 x10E3/uL   Lymphocytes Absolute 3.1 0.7 - 3.1 x10E3/uL   Monocytes Absolute 0.6 0.1 - 0.9 x10E3/uL   EOS (ABSOLUTE) 0.1 0.0 - 0.4 x10E3/uL   Basophils Absolute 0.1 0.0 - 0.2 x10E3/uL   Immature Granulocytes 0 Not Estab. %   Immature Grans (Abs) 0.0 0.0 - 0.1 x10E3/uL  Lipid Panel w/o Chol/HDL Ratio  Result Value Ref Range   Cholesterol, Total 179 100 - 199 mg/dL   Triglycerides 81 0 - 149 mg/dL   HDL 59 >39 mg/dL   VLDL Cholesterol Cal 15 5 - 40 mg/dL   LDL Chol Calc (NIH) 105 (H) 0 - 99 mg/dL  PSA  Result Value Ref Range   Prostate Specific Ag, Serum 2.2 0.0 - 4.0 ng/mL  TSH  Result Value Ref Range   TSH 1.500 0.450 - 4.500 uIU/mL  Urinalysis, Routine w reflex microscopic  Result Value Ref Range   Specific Gravity, UA 1.025 1.005 - 1.030   pH, UA 5.5 5.0 - 7.5   Color, UA Yellow Yellow   Appearance Ur Clear Clear   Leukocytes,UA Trace (A) Negative   Protein,UA Negative Negative/Trace   Glucose, UA Negative Negative   Ketones, UA Negative Negative   RBC, UA 2+ (A) Negative   Bilirubin, UA Negative Negative   Urobilinogen, Ur 0.2 0.2 - 1.0 mg/dL   Nitrite, UA Negative Negative   Microscopic Examination See below:   Microalbumin, Urine Waived  Result Value Ref Range   Microalb, Ur Waived 30 (H) 0 - 19 mg/L   Creatinine, Urine Waived 300 10 - 300 mg/dL   Microalb/Creat Ratio <30 <30 mg/g  Prolactin  Result Value Ref Range   Prolactin 17.6 (H) 4.0 - 15.2 ng/mL      Assessment & Plan:   Problem List Items Addressed This Visit   None Visit Diagnoses     Cough    -  Primary   Will swab for covid and treat symptomatically with prednisone, tussionex and tessalon. Call with any concerns. Await results.   Relevant Orders   Novel Coronavirus, NAA ([facility])        Follow up plan: Return ASAP with PCP for follow up.  This visit was completed via video visit through MyChart due to the  restrictions of the COVID-19 pandemic. All issues as above were discussed and addressed. Physical exam was done as above through visual confirmation on video through MyChart. If it was felt that the patient should be evaluated in the office, they were directed there. The patient verbally consented to this visit. Location of the patient: home Location of the provider: work Those involved with this call:  Provider: Park Liter, DO CMA: Yvonna Alanis, [place] Desk/Registration: Barth Kirks  Time spent on call:  21 minutes on the phone discussing health concerns. 30 minutes total spent in review of patient's record and preparation of their chart.

## 2020-08-19 NOTE — Telephone Encounter (Signed)
He can come get a test. Yes please

## 2020-08-20 ENCOUNTER — Other Ambulatory Visit: Payer: Self-pay

## 2020-08-20 ENCOUNTER — Other Ambulatory Visit: Payer: Medicare HMO

## 2020-08-20 DIAGNOSIS — R059 Cough, unspecified: Secondary | ICD-10-CM

## 2020-08-24 LAB — NOVEL CORONAVIRUS, NAA: SARS-CoV-2, NAA: NOT DETECTED

## 2020-08-24 LAB — SARS-COV-2, NAA 2 DAY TAT

## 2020-08-29 ENCOUNTER — Ambulatory Visit (INDEPENDENT_AMBULATORY_CARE_PROVIDER_SITE_OTHER): Payer: Medicare HMO | Admitting: Nurse Practitioner

## 2020-08-29 ENCOUNTER — Encounter: Payer: Self-pay | Admitting: Nurse Practitioner

## 2020-08-29 ENCOUNTER — Other Ambulatory Visit: Payer: Self-pay

## 2020-08-29 VITALS — BP 151/78 | HR 42 | Temp 97.8°F | Ht 70.24 in | Wt 214.5 lb

## 2020-08-29 DIAGNOSIS — D649 Anemia, unspecified: Secondary | ICD-10-CM

## 2020-08-29 DIAGNOSIS — Z720 Tobacco use: Secondary | ICD-10-CM

## 2020-08-29 DIAGNOSIS — E782 Mixed hyperlipidemia: Secondary | ICD-10-CM

## 2020-08-29 DIAGNOSIS — I129 Hypertensive chronic kidney disease with stage 1 through stage 4 chronic kidney disease, or unspecified chronic kidney disease: Secondary | ICD-10-CM

## 2020-08-29 DIAGNOSIS — N183 Chronic kidney disease, stage 3 unspecified: Secondary | ICD-10-CM | POA: Diagnosis not present

## 2020-08-29 MED ORDER — BENZONATATE 200 MG PO CAPS: 200.0000 mg | ORAL_CAPSULE | Freq: Two times a day (BID) | ORAL | 0 refills | Status: DC | PRN

## 2020-08-29 MED ORDER — ATORVASTATIN CALCIUM 10 MG PO TABS: 10.0000 mg | ORAL_TABLET | Freq: Every day | ORAL | 1 refills | Status: DC

## 2020-08-29 MED ORDER — LISINOPRIL 5 MG PO TABS: 5.0000 mg | ORAL_TABLET | Freq: Every day | ORAL | 1 refills | Status: DC

## 2020-08-29 NOTE — Assessment & Plan Note (Signed)
Chronic.  Controlled.  Continue with current medication regimen.  Labs ordered today.  Refills sent.  Return to clinic in 6 months for reevaluation.  Call sooner if concerns arise.  

## 2020-08-29 NOTE — Assessment & Plan Note (Signed)
Patient has decreased smoking significantly.  He plans to finish this pack of cigarettes and be done with smoking altogether.

## 2020-08-29 NOTE — Progress Notes (Signed)
BP (!) 151/78   Pulse (!) 42   Temp 97.8 F (36.6 C)   Ht 5' 10.24" (1.784 m)   Wt 214 lb 8 oz (97.3 kg)   SpO2 95%   BMI 30.57 kg/m    Subjective:    Patient ID: Matthew Randolph, male    DOB: [birth year], 80 y.o.   MRN: [id number]  HPI: Matthew Randolph is a 80 y.o. male  Chief Complaint  Patient presents with   Cough    Patient states that the cough is better, he was coughing up a lot of mucus.  Unsure if he needs a refill on his cough med, incase it flares up again.  Would like a refill on tessalon pearls as well    HYPERTENSION / HYPERLIPIDEMIA Satisfied with current treatment? yes Duration of hypertension: years BP monitoring frequency: rarely BP range: not sure BP medication side effects: no Past BP meds: lisinopril Duration of hyperlipidemia: years Cholesterol medication side effects: no Cholesterol supplements: none Past cholesterol medications: atorvastain (lipitor) Medication compliance: excellent compliance Aspirin: no Recent stressors: no Recurrent headaches: no Visual changes: no Palpitations: no Dyspnea: no Chest pain: no Lower extremity edema: no Dizzy/lightheaded: no  CHRONIC KIDNEY DISEASE CKD status: controlled Medications renally dose: yes Previous renal evaluation: no Pneumovax:  Up to Date Influenza Vaccine:  Up to Date  ANEMIA Anemia status: controlled Etiology of anemia: CKD Duration of anemia treatment:  Compliance with treatment: excellent compliance Iron supplementation side effects: no Severity of anemia: mild Fatigue: no Decreased exercise tolerance: no  Dyspnea on exertion: no Palpitations: no Bleeding: no Pica: no   Patient's cough has improved since last visit. Would like a refill on Tessalon in case his cough flares again.   Relevant past medical, surgical, family and social history reviewed and updated as indicated. Interim medical history since our last visit reviewed. Allergies and medications reviewed and  updated.  Review of Systems  Constitutional:  Negative for fatigue.  Eyes:  Negative for visual disturbance.  Respiratory:  Negative for chest tightness and shortness of breath.   Cardiovascular:  Negative for chest pain, palpitations and leg swelling.  Gastrointestinal:  Negative for blood in stool and rectal pain.  Genitourinary:  Negative for hematuria.  Neurological:  Negative for dizziness, light-headedness and headaches.   Per HPI unless specifically indicated above     Objective:    BP (!) 151/78   Pulse (!) 42   Temp 97.8 F (36.6 C)   Ht 5' 10.24" (1.784 m)   Wt 214 lb 8 oz (97.3 kg)   SpO2 95%   BMI 30.57 kg/m   Wt Readings from Last 3 Encounters:  08/29/20 214 lb 8 oz (97.3 kg)  08/19/20 210 lb (95.3 kg)  12/14/19 214 lb (97.1 kg)    Physical Exam Vitals and nursing note reviewed.  Constitutional:      General: He is not in acute distress.    Appearance: Normal appearance. He is not ill-appearing, toxic-appearing or diaphoretic.  HENT:     Head: Normocephalic.     Right Ear: External ear normal.     Left Ear: External ear normal.     Nose: Nose normal. No congestion or rhinorrhea.     Mouth/Throat:     Mouth: Mucous membranes are moist.  Eyes:     General:        Right eye: No discharge.        Left eye: No discharge.     Extraocular  Movements: Extraocular movements intact.     Conjunctiva/sclera: Conjunctivae normal.     Pupils: Pupils are equal, round, and reactive to light.  Cardiovascular:     Rate and Rhythm: Normal rate and regular rhythm.     Heart sounds: No murmur heard. Pulmonary:     Effort: Pulmonary effort is normal. No respiratory distress.     Breath sounds: Normal breath sounds. No wheezing, rhonchi or rales.  Abdominal:     General: Abdomen is flat. Bowel sounds are normal.  Musculoskeletal:     Cervical back: Normal range of motion and neck supple.  Skin:    General: Skin is warm and dry.     Capillary Refill: Capillary refill  takes less than 2 seconds.  Neurological:     General: No focal deficit present.     Mental Status: He is alert and oriented to person, place, and time.  Psychiatric:        Mood and Affect: Mood normal.        Behavior: Behavior normal.        Thought Content: Thought content normal.        Judgment: Judgment normal.    Results for orders placed or performed in visit on 08/20/20  Novel Coronavirus, NAA ([facility])   Specimen: Saline  Result Value Ref Range   SARS-CoV-2, NAA Not Detected Not Detected  SARS-COV-2, NAA 2 DAY TAT  Result Value Ref Range   SARS-CoV-2, NAA 2 DAY TAT Performed       Assessment & Plan:   Problem List Items Addressed This Visit       Genitourinary   Hypertensive renal disease    Chronic.  Controlled.  Continue with current medication regimen on Lisinopril 31m dail.  Labs ordered today.  Refills sent today. Return to clinic in 6 months for reevaluation.  Call sooner if concerns arise.        CKD (chronic kidney disease) - Primary    Chronic.  Controlled.  Continue with current medication regimen.  Labs ordered today. Patient requested refill of Mobic to take for arthritis. Discussed with patient that he should not be taking any NSAIDS due to kidney disease.  Patient agrees to stop taking medication.  Return to clinic in 4 months for reevaluation.  Call sooner if concerns arise.        Relevant Orders   Comp Met ([facility])     Other   Tobacco abuse    Patient has decreased smoking significantly.  He plans to finish this pack of cigarettes and be done with smoking altogether.       Hyperlipemia    Chronic.  Controlled.  Continue with current medication regimen.  Labs ordered today. Refills sent.  Return to clinic in 6 months for reevaluation.  Call sooner if concerns arise.        Relevant Medications   atorvastatin (LIPITOR) 10 MG tablet   lisinopril (ZESTRIL) 5 MG tablet   Other Relevant Orders   Lipid Profile   Anemia    Labs ordered today.  Will make further recommendations based on lab results.       Relevant Orders   CBC w/Diff     Follow up plan: Return in about 4 months (around 12/29/2020) for HTN, HLD, DM2 FU.

## 2020-08-29 NOTE — Assessment & Plan Note (Signed)
Labs ordered today. Will make further recommendations based on lab results.

## 2020-08-29 NOTE — Assessment & Plan Note (Signed)
Chronic.  Controlled.  Continue with current medication regimen.  Labs ordered today. Patient requested refill of Mobic to take for arthritis. Discussed with patient that he should not be taking any NSAIDS due to kidney disease.  Patient agrees to stop taking medication.  Return to clinic in 4 months for reevaluation.  Call sooner if concerns arise.

## 2020-08-29 NOTE — Assessment & Plan Note (Signed)
Chronic.  Controlled.  Continue with current medication regimen on Lisinopril '10mg'$  dail.  Labs ordered today.  Refills sent today. Return to clinic in 6 months for reevaluation.  Call sooner if concerns arise.

## 2020-08-30 LAB — COMPREHENSIVE METABOLIC PANEL
ALT: 20 IU/L (ref 0–44)
AST: 19 IU/L (ref 0–40)
Albumin/Globulin Ratio: 1.5 (ref 1.2–2.2)
Albumin: 4.1 g/dL (ref 3.7–4.7)
Alkaline Phosphatase: 57 IU/L (ref 44–121)
BUN/Creatinine Ratio: 13 (ref 10–24)
BUN: 15 mg/dL (ref 8–27)
Bilirubin Total: 0.7 mg/dL (ref 0.0–1.2)
CO2: 24 mmol/L (ref 20–29)
Calcium: 9.6 mg/dL (ref 8.6–10.2)
Chloride: 104 mmol/L (ref 96–106)
Creatinine, Ser: 1.14 mg/dL (ref 0.76–1.27)
Globulin, Total: 2.7 g/dL (ref 1.5–4.5)
Glucose: 93 mg/dL (ref 65–99)
Potassium: 4.3 mmol/L (ref 3.5–5.2)
Sodium: 141 mmol/L (ref 134–144)
Total Protein: 6.8 g/dL (ref 6.0–8.5)
eGFR: 66 mL/min/{1.73_m2} (ref >59)

## 2020-08-30 LAB — LIPID PANEL
Chol/HDL Ratio: 2.4 ratio (ref 0.0–5.0)
Cholesterol, Total: 156 mg/dL (ref 100–199)
HDL: 65 mg/dL (ref >39)
LDL Chol Calc (NIH): 72 mg/dL (ref 0–99)
Triglycerides: 104 mg/dL (ref 0–149)
VLDL Cholesterol Cal: 19 mg/dL (ref 5–40)

## 2020-08-30 LAB — CBC WITH DIFFERENTIAL/PLATELET
Basophils Absolute: 0.1 10*3/uL (ref 0.0–0.2)
Basos: 1 %
EOS (ABSOLUTE): 0.7 10*3/uL — ABNORMAL HIGH (ref 0.0–0.4)
Eos: 9 %
Hematocrit: 39.1 % (ref 37.5–51.0)
Hemoglobin: 12.4 g/dL — ABNORMAL LOW (ref 13.0–17.7)
Immature Grans (Abs): 0 10*3/uL (ref 0.0–0.1)
Immature Granulocytes: 0 %
Lymphocytes Absolute: 2.9 10*3/uL (ref 0.7–3.1)
Lymphs: 38 %
MCH: 27.4 pg (ref 26.6–33.0)
MCHC: 31.7 g/dL (ref 31.5–35.7)
MCV: 87 fL (ref 79–97)
Monocytes Absolute: 0.8 10*3/uL (ref 0.1–0.9)
Monocytes: 10 %
Neutrophils Absolute: 3.2 10*3/uL (ref 1.4–7.0)
Neutrophils: 42 %
Platelets: 268 10*3/uL (ref 150–450)
RBC: 4.52 x10E6/uL (ref 4.14–5.80)
RDW: 12.4 % (ref 11.6–15.4)
WBC: 7.6 10*3/uL (ref 3.4–10.8)

## 2020-08-30 NOTE — Progress Notes (Signed)
Hi Matthew Randolph. Your lab work looks great.  No concerns at this time.  Continue with your current medication regimen.  Follow up as discussed.

## 2020-09-01 DIAGNOSIS — Z720 Tobacco use: Secondary | ICD-10-CM

## 2020-09-02 MED ORDER — HYDROCOD POLST-CPM POLST ER 10-8 MG/5ML PO SUER: 5.0000 mL | Freq: Two times a day (BID) | ORAL | 0 refills | Status: DC | PRN

## 2020-09-13 ENCOUNTER — Ambulatory Visit
Admission: RE | Admit: 2020-09-13 | Discharge: 2020-09-13 | Disposition: A | Discharge status: Home / Self Care | Payer: Medicare HMO | Source: Ambulatory Visit | Attending: Internal Medicine | Admitting: Internal Medicine

## 2020-09-13 ENCOUNTER — Other Ambulatory Visit: Payer: Self-pay | Admitting: Nurse Practitioner

## 2020-09-13 ENCOUNTER — Ambulatory Visit: Payer: Medicare HMO | Admitting: Internal Medicine

## 2020-09-13 ENCOUNTER — Other Ambulatory Visit: Payer: Self-pay | Admitting: Family Medicine

## 2020-09-13 ENCOUNTER — Encounter: Payer: Self-pay | Admitting: Internal Medicine

## 2020-09-13 ENCOUNTER — Other Ambulatory Visit: Payer: Self-pay

## 2020-09-13 DIAGNOSIS — F17210 Nicotine dependence, cigarettes, uncomplicated: Secondary | ICD-10-CM | POA: Diagnosis not present

## 2020-09-13 DIAGNOSIS — R058 Other specified cough: Secondary | ICD-10-CM

## 2020-09-13 DIAGNOSIS — I10 Essential (primary) hypertension: Secondary | ICD-10-CM | POA: Diagnosis not present

## 2020-09-13 DIAGNOSIS — R059 Cough, unspecified: Secondary | ICD-10-CM | POA: Diagnosis not present

## 2020-09-13 DIAGNOSIS — R69 Illness, unspecified: Secondary | ICD-10-CM | POA: Diagnosis not present

## 2020-09-13 DIAGNOSIS — J449 Chronic obstructive pulmonary disease, unspecified: Secondary | ICD-10-CM

## 2020-09-13 DIAGNOSIS — J4489 Other specified chronic obstructive pulmonary disease: Secondary | ICD-10-CM | POA: Insufficient documentation

## 2020-09-13 DIAGNOSIS — J9811 Atelectasis: Secondary | ICD-10-CM | POA: Diagnosis not present

## 2020-09-13 MED ORDER — PANTOPRAZOLE SODIUM 40 MG PO TBEC: 40.0000 mg | DELAYED_RELEASE_TABLET | Freq: Every day | ORAL | 2 refills | Status: DC

## 2020-09-13 MED ORDER — FAMOTIDINE 20 MG PO TABS: ORAL_TABLET | ORAL | 11 refills | Status: DC

## 2020-09-13 MED ORDER — PREDNISONE 10 MG PO TABS: ORAL_TABLET | ORAL | 0 refills | Status: DC

## 2020-09-13 MED ORDER — VALSARTAN 80 MG PO TABS: 80.0000 mg | ORAL_TABLET | Freq: Every day | ORAL | 11 refills | Status: DC

## 2020-09-13 MED ORDER — BUDESONIDE-FORMOTEROL FUMARATE 80-4.5 MCG/ACT IN AERO: INHALATION_SPRAY | RESPIRATORY_TRACT | 12 refills | Status: DC

## 2020-09-13 NOTE — Assessment & Plan Note (Signed)
Active smoker   DDX of  difficult airways management almost all start with A and  include Adherence, Ace Inhibitors, Acid Reflux, Active Sinus Disease, Alpha 1 Antitripsin deficiency, Anxiety masquerading as Airways dz,  ABPA,  Allergy(esp in young), Aspiration (esp in elderly), Adverse effects of meds,  Active smoking or vaping, A bunch of PE's (a small clot burden can't cause this syndrome unless there is already severe underlying pulm or vascular dz with poor reserve) plus two Bs  = Bronchiectasis and Beta blocker use..and one C= CHF   Adherence is always the initial "prime suspect" and is a multilayered concern that requires a "trust but verify" approach in every patient - starting with knowing how to use medications, especially inhalers, correctly, keeping up with refills and understanding the fundamental difference between maintenance and prns vs those medications only taken for a very short course and then stopped and not refilled.  - - The proper method of use, as well as anticipated side effects, of a metered-dose inhaler were discussed and demonstrated to the patient using teach back method.  - rec return with all meds in hand using a trust but verify approach to confirm accurate Medication  Reconciliation The principal here is that until we are certain that the  patients are doing what we've asked, it makes no sense to ask them to do more.    Active smoker > see sep a/p  ACEi adverse effects at the  top of the usual list of suspects and the only way to rule it out is a trial off > see a/p    ? Acid (or non-acid) GERD > always difficult to exclude as up to 75% of pts in some series report no assoc GI/ Heartburn symptoms> rec max (24h)  acid suppression and diet restrictions/ reviewed    ? Allergy/asthma >  rx symbicort 80 2bid and prn saba   ? Alpha one AT def > very rare in African Americans  >>> f/u in 6 weeks to regroup

## 2020-09-13 NOTE — Assessment & Plan Note (Signed)
Counseled re importance of smoking cessation but did not meet time criteria for separate billing            Each maintenance medication was reviewed in detail including emphasizing most importantly the difference between maintenance and prns and under what circumstances the prns are to be triggered using an action plan format where appropriate.  Total time for H and P, chart review, counseling, reviewing hfa device(s) and generating customized AVS unique to this office visit / same day charting  > 45 min

## 2020-09-13 NOTE — Progress Notes (Signed)
Matthew Randolph, male    DOB: 1940-10-04  MRN: [id number]   Brief patient profile: 89 yobm stopped smoking 08/2020 pattern of bronchitis x 2018 using saba prn then mid August 2022 severe cough with wheezing / sob > rec prednisone / tessalon/ tussionex / ventolin but continued with cough esp noct so referred to pulmonary clinic in [facility]   09/13/2020 by Jon Billings NP          History of Present Illness  09/13/2020  Pulmonary/ 1st office eval/ Matthew Randolph / [employer] / on ACEi  Chief Complaint  Patient presents with   Consult    Wheezing, coughing.   Dyspnea:  denies including push mower Cough: mostly non-productive worse to point of almost choking noct  Sleep: on side/ bed is flat/ one pillow  SABA use: not x 48 h, only helps a little   No other obvious day to day or daytime variability or assoc excess/ purulent sputum or mucus plugs or hemoptysis or cp or chest tightness, subjective wheeze or overt sinus or hb symptoms.   Also denies any obvious fluctuation of symptoms with weather or environmental changes or other aggravating or alleviating factors except as outlined above   No unusual exposure hx or h/o childhood pna/ asthma or knowledge of premature birth.  Current Allergies, Complete Past Medical History, Past Surgical History, Family History, and Social History were reviewed in [employer] record.  ROS  The following are not active complaints unless bolded Hoarseness, sore throat, dysphagia, dental problems, itching, sneezing,  nasal congestion or discharge of excess mucus or purulent secretions, ear ache,   fever, chills, sweats, unintended wt loss or wt gain, classically pleuritic or exertional cp,  orthopnea pnd or arm/hand swelling  or leg swelling, presyncope, palpitations, abdominal pain, anorexia, nausea, vomiting, diarrhea  or change in bowel habits or change in bladder habits, change in stools or change in urine, dysuria, hematuria,  rash,  arthralgias, visual complaints, headache, numbness, weakness or ataxia or problems with walking or coordination,  change in mood or  memory.           Past Medical History:  Diagnosis Date   Arthritis    Benign tumor of pituitary gland ([place])    Chronic kidney disease    ED (erectile dysfunction)    Erectile dysfunction    GERD (gastroesophageal reflux disease)    High risk sexual behavior    Hyperlipidemia    Hypertension    Pneumonia 2012   Tobacco use disorder, continuous     Outpatient Medications Prior to Visit  Medication Sig Dispense Refill   albuterol (VENTOLIN HFA) 108 (90 Base) MCG/ACT inhaler Inhale 2 puffs into the lungs every 6 (six) hours as needed for wheezing or shortness of breath. 54 g 3   atorvastatin (LIPITOR) 10 MG tablet Take 1 tablet (10 mg total) by mouth daily. 90 tablet 1   benzonatate (TESSALON) 200 MG capsule Take 1 capsule (200 mg total) by mouth 2 (two) times daily as needed for cough. 40 capsule 0   chlorpheniramine-HYDROcodone (TUSSIONEX PENNKINETIC ER) 10-8 MG/5ML SUER Take 5 mLs by mouth every 12 (twelve) hours as needed. 50 mL 0   fluticasone (FLONASE) 50 MCG/ACT nasal spray Place 2 sprays into both nostrils daily. 48 g 3   lisinopril (ZESTRIL) 5 MG tablet Take 1 tablet (5 mg total) by mouth daily. 90 tablet 1   Menthol, Topical Analgesic, (ICY HOT EX) Apply 1 application topically daily as needed (  pain).     methocarbamol (ROBAXIN) 500 MG tablet Take 1 tablet (500 mg total) by mouth every 6 (six) hours as needed for muscle spasms. 30 tablet 0   Multiple Vitamin (MULTIVITAMIN) tablet Take 1 tablet by mouth daily.     pantoprazole (PROTONIX) 40 MG tablet Take 1 tablet (40 mg total) by mouth daily as needed. 90 tablet 2   No facility-administered medications prior to visit.     Objective:     BP 122/82 (BP Location: Left Arm, Patient Position: Sitting, Cuff Size: Normal)   Pulse (!) 48   Temp 98.2 F (36.8 C) (Oral)   Ht 6' (1.829 m)   Wt  212 lb 9.6 oz (96.4 kg)   SpO2 95%   BMI 28.83 kg/m   SpO2: 95 %  Amb slt hoarse amb bm nad   HEENT : pt wearing mask not removed for exam due to covid - 19 concerns.   NECK :  without JVD/Nodes/TM/ nl carotid upstrokes bilaterally   LUNGS: no acc muscle use,  Min barrel  contour chest wall with bilateral  end expwheeze and  with cough   exp maneuvers and min  Hyperresonant  to  percussion bilaterally     CV:  RRR  no s3 or murmur or increase in P2, and no edema   ABD:  soft and nontender with pos end  insp Hoover's  in the supine position. No bruits or organomegaly appreciated, bowel sounds nl  MS:   Nl gait/  ext warm without deformities, calf tenderness, cyanosis or clubbing No obvious joint restrictions   SKIN: warm and dry without lesions    NEURO:  alert, approp, nl sensorium with  no motor or cerebellar deficits apparent.       CXR PA and Lateral:   09/13/2020 :    I personally reviewed images  /impression as follows:    Mild copd/ no acute changes     Assessment   Asthmatic bronchitis , chronic (HCC) Active smoker   DDX of  difficult airways management almost all start with A and  include Adherence, Ace Inhibitors, Acid Reflux, Active Sinus Disease, Alpha 1 Antitripsin deficiency, Anxiety masquerading as Airways dz,  ABPA,  Allergy(esp in young), Aspiration (esp in elderly), Adverse effects of meds,  Active smoking or vaping, A bunch of PE's (a small clot burden can't cause this syndrome unless there is already severe underlying pulm or vascular dz with poor reserve) plus two Bs  = Bronchiectasis and Beta blocker use..and one C= CHF   Adherence is always the initial "prime suspect" and is a multilayered concern that requires a "trust but verify" approach in every patient - starting with knowing how to use medications, especially inhalers, correctly, keeping up with refills and understanding the fundamental difference between maintenance and prns vs those medications  only taken for a very short course and then stopped and not refilled.  - - The proper method of use, as well as anticipated side effects, of a metered-dose inhaler were discussed and demonstrated to the patient using teach back method.  - rec return with all meds in hand using a trust but verify approach to confirm accurate Medication  Reconciliation The principal here is that until we are certain that the  patients are doing what we've asked, it makes no sense to ask them to do more.    Active smoker > see sep a/p  ACEi adverse effects at the  top of the usual list of  suspects and the only way to rule it out is a trial off > see a/p    ? Acid (or non-acid) GERD > always difficult to exclude as up to 75% of pts in some series report no assoc GI/ Heartburn symptoms> rec max (24h)  acid suppression and diet restrictions/ reviewed    ? Allergy/asthma >  rx symbicort 80 2bid and prn saba   ? Alpha one AT def > very rare in African Americans  >>> f/u in 6 weeks to regroup  Upper airway cough syndrome Onset mid aug 2022 -  D/c acei 09/13/2020 and rx for gerd  Upper airway cough syndrome (previously labeled PNDS),  is so named because it's frequently impossible to sort out how much is  CR/sinusitis with freq throat clearing (which can be related to primary GERD)   vs  causing  secondary (" extra esophageal")  GERD from wide swings in gastric pressure that occur with throat clearing, often  promoting self use of mint and menthol lozenges that reduce the lower esophageal sphincter tone and exacerbate the problem further in a cyclical fashion.   These are the same pts (now being labeled as having "irritable larynx syndrome" by some cough centers) who not infrequently have a history of having failed to tolerate ace inhibitors,  dry powder inhalers or biphosphonates or report having atypical/extraesophageal reflux symptoms that don't respond to standard doses of PPI  and are easily confused as having aecopd  or asthma flares by even experienced allergists/ pulmonologists (myself included).   Regroup in 6 weeks off acei and on max gerd rx   Essential hypertension D/c acei 09/13/2020 due to atypical copd symptoms  In the best review of chronic cough to date ( NEJM 2016 375 [id number]) ,  ACEi are now felt to cause cough in up to  20% of pts which is a 4 fold increase from previous reports and does not include the variety of non-specific complaints we see in pulmonary clinic in pts on ACEi but previously attributed to another dx like  Copd/asthma and  include PNDS, throat and chest congestion, "bronchitis", unexplained dyspnea and noct "strangling" sensations, and hoarseness, but also  atypical /refractory GERD symptoms like dysphagia and "bad heartburn"   The only way I know  to prove this is not an "ACEi Case" is a trial off ACEi x a minimum of 6 weeks then regroup.  >>> try  diovan 80 mg daily and regroup in 6 weeks   Cigarette smoker Counseled re importance of smoking cessation but did not meet time criteria for separate billing            Each maintenance medication was reviewed in detail including emphasizing most importantly the difference between maintenance and prns and under what circumstances the prns are to be triggered using an action plan format where appropriate.  Total time for H and P, chart review, counseling, reviewing hfa device(s) and generating customized AVS unique to this office visit / same day charting  > 45 min           Christinia Gully, MD 09/13/2020

## 2020-09-13 NOTE — Assessment & Plan Note (Signed)
D/c acei 09/13/2020 due to atypical copd symptoms  In the best review of chronic cough to date ( NEJM 2016 375 [id number]) ,  ACEi are now felt to cause cough in up to  20% of pts which is a 4 fold increase from previous reports and does not include the variety of non-specific complaints we see in pulmonary clinic in pts on ACEi but previously attributed to another dx like  Copd/asthma and  include PNDS, throat and chest congestion, "bronchitis", unexplained dyspnea and noct "strangling" sensations, and hoarseness, but also  atypical /refractory GERD symptoms like dysphagia and "bad heartburn"   The only way I know  to prove this is not an "ACEi Case" is a trial off ACEi x a minimum of 6 weeks then regroup.  >>> try  diovan 80 mg daily and regroup in 6 weeks

## 2020-09-13 NOTE — Assessment & Plan Note (Signed)
Onset mid aug 2022 -  D/c acei 09/13/2020 and rx for gerd  Upper airway cough syndrome (previously labeled PNDS),  is so named because it's frequently impossible to sort out how much is  CR/sinusitis with freq throat clearing (which can be related to primary GERD)   vs  causing  secondary (" extra esophageal")  GERD from wide swings in gastric pressure that occur with throat clearing, often  promoting self use of mint and menthol lozenges that reduce the lower esophageal sphincter tone and exacerbate the problem further in a cyclical fashion.   These are the same pts (now being labeled as having "irritable larynx syndrome" by some cough centers) who not infrequently have a history of having failed to tolerate ace inhibitors,  dry powder inhalers or biphosphonates or report having atypical/extraesophageal reflux symptoms that don't respond to standard doses of PPI  and are easily confused as having aecopd or asthma flares by even experienced allergists/ pulmonologists (myself included).   Regroup in 6 weeks off acei and on max gerd rx

## 2020-09-13 NOTE — Patient Instructions (Addendum)
Plan A = Automatic = Always=    Symbicort 80 Take 2 puffs first thing in am and then another 2 puffs about 12 hours later.   Work on inhaler technique:  relax and gently blow all the way out then take a nice smooth full deep breath back in, triggering the inhaler at same time you start breathing in.  Hold for up to 5 seconds if you can. Blow out thru nose. Rinse and gargle with water when done.  If mouth or throat bother you at all,  try brushing teeth/gums/tongue with arm and hammer toothpaste/ make a slurry and gargle and spit out.      Stop lisinopril and start valsartan 80 mg daily    Pantoprazole (protonix) 40 mg   Take  30-60 min before first meal of the day and Pepcid (famotidine)  20 mg after supper until return to office - this is the best way to tell whether stomach acid is contributing to your problem.    Plan B = Backup (to supplement plan A, not to replace it) Only use your albuterol inhaler as a rescue medication to be used if you can't catch your breath by resting or doing a relaxed purse lip breathing pattern.  - The less you use it, the better it will work when you need it. - Ok to use the inhaler up to 2 puffs  every 4 hours if you must but call for appointment if use goes up over your usual need - Don't leave home without it !!  (think of it like the spare tire for your car)    Prednisone 10 mg take  4 each am x 2 days,   2 each am x 2 days,  1 each am x 2 days and stop    Please remember to go to the  x-ray department  for your tests - we will call you with the results when they are available     Please schedule a follow up office visit in 6 weeks, call sooner if needed with all medications /inhalers/ solutions in hand so we can verify exactly what you are taking. This includes all medications from all doctors and over the [place] separate them into two bags:  the ones you take automatically, no matter what, vs the ones you take just when you feel you need them  "BAG #2 is UP TO YOU"  - this will really help [place] help you take your medications more effectively.

## 2020-09-14 NOTE — Telephone Encounter (Signed)
Requested medications are due for refill today.  Unknown  Requested medications are on the active medications list.  no  Last refill. 08/29/2020  Future visit scheduled.   yes  Notes to clinic.  Medication was d/c'd 09/13/2020 by Dr. Melvyn Novas.

## 2020-09-17 NOTE — Telephone Encounter (Signed)
Medication was discontinued by Dr. Melvyn Novas.  Looks like her is taking Valsartan.

## 2020-09-18 ENCOUNTER — Ambulatory Visit: Payer: Medicare HMO

## 2020-09-20 ENCOUNTER — Telehealth: Payer: Self-pay

## 2020-09-20 ENCOUNTER — Ambulatory Visit: Payer: Medicare HMO

## 2020-09-20 NOTE — Telephone Encounter (Signed)
This nurse attempted to call patient in order to perform scheduled telephonic AWV. Patient's friend Rollene Fare answered and stated that he could not do the visit today. She wanted to know if it can be done on Monday. I advised her to call the office to see if they could accommodate that.

## 2020-09-23 ENCOUNTER — Ambulatory Visit (INDEPENDENT_AMBULATORY_CARE_PROVIDER_SITE_OTHER): Payer: Medicare HMO

## 2020-09-23 DIAGNOSIS — Z0000 Encounter for general adult medical examination without abnormal findings: Secondary | ICD-10-CM | POA: Diagnosis not present

## 2020-09-23 NOTE — Progress Notes (Signed)
Subjective:   Matthew Randolph is a 80 y.o. male who presents for Medicare Annual/Subsequent preventive examination.  I connected with  Alvester Chou on 09/23/20 by an audio only telemedicine application and verified that I am speaking with the correct person using two identifiers.   I discussed the limitations, risks, security and privacy concerns of performing an evaluation and management service by telephone and the availability of in person appointments. I also discussed with the patient that there may be a patient responsible charge related to this service. The patient expressed understanding and verbally consented to this telephonic visit.  Location of Patient: Home   Location of Provider: [place]   Persons Participating in Visit: Matthew Randolph (Patient), Irena Reichmann ([place])  List any persons and their role that are participating in the visit with the patient.    Review of Systems     Defer to Provider Cardiac Risk Factors include: none     Objective:    There were no vitals filed for this visit. There is no height or weight on file to calculate BMI.  Advanced Directives 09/23/2020 09/18/2019 10/04/2018 10/04/2018 08/18/2018 09/09/2017 09/02/2017  Does Patient Have a Medical Advance Directive? Yes No No No No No No  Type of Advance Directive [place]  Does patient want to make changes to medical advance directive? No - Patient declined - - - - - -  Copy of Press photographer in Chart? - - - - - - -  Would patient like information on creating a medical advance directive? - - No - Patient declined No - Patient declined - No - Patient declined -    Current Medications (verified) Outpatient Encounter Medications as of 09/23/2020  Medication Sig   albuterol (VENTOLIN HFA) 108 (90 Base) MCG/ACT inhaler Inhale 2 puffs into the lungs every 6 (six) hours as needed for wheezing or shortness of breath.   atorvastatin (LIPITOR) 10 MG  tablet Take 1 tablet (10 mg total) by mouth daily.   benzonatate (TESSALON) 200 MG capsule Take 1 capsule (200 mg total) by mouth 2 (two) times daily as needed for cough.   budesonide-formoterol (SYMBICORT) 80-4.5 MCG/ACT inhaler Take 2 puffs first thing in am and then another 2 puffs about 12 hours later.   chlorpheniramine-HYDROcodone (TUSSIONEX PENNKINETIC ER) 10-8 MG/5ML SUER Take 5 mLs by mouth every 12 (twelve) hours as needed.   famotidine (PEPCID) 20 MG tablet One after supper   fluticasone (FLONASE) 50 MCG/ACT nasal spray Place 2 sprays into both nostrils daily.   Menthol, Topical Analgesic, (ICY HOT EX) Apply 1 application topically daily as needed (pain).   methocarbamol (ROBAXIN) 500 MG tablet Take 1 tablet (500 mg total) by mouth every 6 (six) hours as needed for muscle spasms.   Multiple Vitamin (MULTIVITAMIN) tablet Take 1 tablet by mouth daily.   pantoprazole (PROTONIX) 40 MG tablet Take 1 tablet (40 mg total) by mouth daily. Take 30-60 min before first meal of the day   predniSONE (DELTASONE) 10 MG tablet Take  4 each am x 2 days,   2 each am x 2 days,  1 each am x 2 days and stop   valsartan (DIOVAN) 80 MG tablet Take 1 tablet (80 mg total) by mouth daily.   No facility-administered encounter medications on file as of 09/23/2020.    Allergies (verified) Patient has no known allergies.   History: Past Medical History:  Diagnosis Date  Arthritis    Benign tumor of pituitary gland ([place])    Chronic kidney disease    ED (erectile dysfunction)    Erectile dysfunction    GERD (gastroesophageal reflux disease)    High risk sexual behavior    Hyperlipidemia    Hypertension    Pneumonia 2012   Tobacco use disorder, continuous    Past Surgical History:  Procedure Laterality Date   COLONOSCOPY  2015   LIPOMA EXCISION Left 10/04/2018   Procedure: EXCISION LIPOMA LEFT HIP;  Surgeon: Hessie Knows, MD;  Location: [facility] ORS;  Service: Orthopedics;  Laterality: Left;   PITUITARY  SURGERY  12/10/2015   TOTAL HIP ARTHROPLASTY Right 09/09/2017   Procedure: TOTAL HIP ARTHROPLASTY ANTERIOR APPROACH;  Surgeon: Hessie Knows, MD;  Location: [facility] ORS;  Service: Orthopedics;  Laterality: Right;   TOTAL HIP ARTHROPLASTY Left 10/04/2018   Procedure: TOTAL HIP ARTHROPLASTY ANTERIOR APPROACH;  Surgeon: Hessie Knows, MD;  Location: [facility] ORS;  Service: Orthopedics;  Laterality: Left;   Family History  Problem Relation Age of Onset   Brain cancer Mother    Cancer Mother        Brain tumor   Thyroid disease Mother    Lung disease Mother        From [place]   Heart disease Father    Stroke Father    Hypertension Brother    Aneurysm Brother    Pneumonia Brother    Heart disease Sister        massive MI   Obesity Sister    Social History   Socioeconomic History   Marital status: Divorced    Spouse name: Not on file   Number of children: 4   Years of education: 12th   Highest education level: High school graduate  Occupational History   Occupation: Retired Visual merchandiser  Tobacco Use   Smoking status: Every Day    Packs/day: 0.25    Years: 40.00    Pack years: 10.00    Types: Cigarettes   Smokeless tobacco: Never   Tobacco comments:    2-3 cig daily  Vaping Use   Vaping Use: Never used  Substance and Sexual Activity   Alcohol use: Not Currently   Drug use: No   Sexual activity: Yes    Partners: Female  Other Topics Concern   Not on file  Social History Narrative   ** Merged History Encounter **       Social Determinants of Health   Financial Resource Strain: Low Risk    Difficulty of Paying Living Expenses: Not hard at all  Food Insecurity: No Food Insecurity   Worried About Charity fundraiser in the Last Year: Never true   Ran Out of Food in the Last Year: Never true  Transportation Needs: No Transportation Needs   Lack of Transportation (Medical): No   Lack of Transportation (Non-Medical): No  Physical Activity: Sufficiently Active   Days of  Exercise per Week: 4 days   Minutes of Exercise per Session: 60 min  Stress: No Stress Concern Present   Feeling of Stress : Not at all  Social Connections: Moderately Isolated   Frequency of Communication with Friends and Family: More than three times a week   Frequency of Social Gatherings with Friends and Family: Three times a week   Attends Religious Services: More than 4 times per year   Active Member of Clubs or Organizations: No   Attends Archivist Meetings: Never   Marital Status:  Divorced    Tobacco Counseling Ready to quit: Not Answered Counseling given: Not Answered Tobacco comments: 2-3 cig daily   Clinical Intake:  Pre-visit preparation completed: Yes  Pain : No/denies pain     Nutritional Risks: None Diabetes: No  How often do you need to have someone help you when you read instructions, pamphlets, or other written materials from your doctor or pharmacy?: 4 - Often What is the last grade level you completed in school?: 12th  Diabetic? No  Interpreter Needed?: No      Activities of Daily Living In your present state of health, do you have any difficulty performing the following activities: 09/23/2020 12/14/2019  Hearing? N N  Vision? N N  Difficulty concentrating or making decisions? N N  Walking or climbing stairs? N N  Dressing or bathing? N N  Doing errands, shopping? N N  Preparing Food and eating ? N -  Using the Toilet? N -  In the past six months, have you accidently leaked urine? N -  Do you have problems with loss of bowel control? N -  Managing your Medications? N -  Managing your Finances? N -  Housekeeping or managing your Housekeeping? N -  Some recent data might be hidden    Patient Care Team: Jon Billings, NP as PCP - General Kate Sable, MD as PCP - Cardiology (Cardiology) Derrill Memo, MD as Referring Physician (Neurosurgery) Greg Cutter, LCSW as [place] [facility] (Licensed  Clinical Social Worker) Minor, Dalbert Garnet, RN (Inactive) as [place] any recent [place] you may have received from other than [facility] providers in the past year (date may be approximate).     Assessment:   This is a routine wellness examination for [place].  Hearing/Vision screen No results found.  Dietary issues and exercise activities discussed: Current Exercise Habits: Home exercise routine, Type of exercise: walking, Time (Minutes): 60, Frequency (Times/Week): 4, Weekly Exercise (Minutes/Week): 240, Intensity: Mild, Exercise limited by: None identified   Goals Addressed   None   Depression Screen PHQ 2/9 Scores 09/23/2020 12/14/2019 09/18/2019 11/11/2018 08/18/2018 08/20/2017 08/05/2017  PHQ - 2 Score 0 0 0 0 0 0 0  PHQ- 9 Score - - - - - 0 -    Fall Risk Fall Risk  09/23/2020 09/18/2019 08/18/2018 04/20/2018 08/05/2017  Falls in the past year? 0 0 0 0 No  Number falls in past yr: 0 - - - -  Injury with Fall? 0 - - - -  Risk for fall due to : No Fall Risks Impaired balance/gait;Medication side effect - - -  Follow up Falls evaluation completed Falls evaluation completed;Education provided;Falls prevention discussed - Falls evaluation completed -    FALL RISK PREVENTION PERTAINING TO THE HOME:  Any stairs in or around the home? Yes  If so, are there any without handrails? Yes  Home free of loose throw rugs in walkways, pet beds, electrical cords, etc? Yes  Adequate lighting in your home to reduce risk of falls? Yes   ASSISTIVE DEVICES UTILIZED TO PREVENT FALLS:  Life alert? No  Use of a cane, walker or w/c? Yes  Grab bars in the bathroom? Yes  Shower chair or bench in shower? No  Elevated toilet seat or a handicapped toilet? Yes   TIMED UP AND GO:  Was the test performed? N/A.  Length of time to ambulate 10 feet: N/A sec.     Cognitive Function:  6CIT Screen 09/23/2020 09/18/2019 08/05/2017 07/29/2016  What Year? 0 points 0  points 0 points 0 points  What month? 3 points 0 points 0 points 0 points  What time? 0 points 0 points 0 points 0 points  Count back from 20 0 points 2 points 0 points 0 points  Months in reverse 2 points 4 points 0 points 0 points  Repeat phrase 0 points 0 points 2 points 6 points  Total Score '5 6 2 6    '$ Immunizations Immunization History  Administered Date(s) Administered   Influenza, High Dose Seasonal PF 10/26/2016, 10/19/2017, 09/19/2018, 12/14/2019   Influenza-Unspecified 10/15/2015   Moderna Sars-Covid-2 Vaccination 03/11/2019, 04/15/2019   Pneumococcal Conjugate-13 12/13/2013   Pneumococcal Polysaccharide-23 04/10/2008   Pneumococcal-Unspecified 04/10/2008   Td 10/05/2005, 10/05/2005, 08/19/2016   Zoster, Live 04/10/2008    TDAP status: Up to date  Flu Vaccine status: Due, Education has been provided regarding the importance of this vaccine. Advised may receive this vaccine at local pharmacy or Health Dept. Aware to provide a copy of the vaccination record if obtained from local pharmacy or Health Dept. Verbalized acceptance and understanding.  Pneumococcal vaccine status: Due, Education has been provided regarding the importance of this vaccine. Advised may receive this vaccine at local pharmacy or Health Dept. Aware to provide a copy of the vaccination record if obtained from local pharmacy or Health Dept. Verbalized acceptance and understanding.  Covid-19 vaccine status: Completed vaccines  Qualifies for Shingles Vaccine? Yes   Zostavax completed  Shingrix Completed?: No.    Education has been provided regarding the importance of this vaccine. Patient has been advised to call insurance company to determine out of pocket expense if they have not yet received this vaccine. Advised may also receive vaccine at local pharmacy or Health Dept. Verbalized acceptance and understanding.  Screening Tests Health Maintenance  Topic Date Due   Zoster Vaccines- Shingrix (1 of 2)  Never done   COVID-19 Vaccine (3 - Booster for Moderna series) 09/29/2020 (Originally 09/15/2019)   INFLUENZA VACCINE  04/04/2021 (Originally 08/05/2020)   TETANUS/TDAP  08/20/2026   Hepatitis C Screening  Completed   HPV VACCINES  Aged Out    Health Maintenance  Health Maintenance Due  Topic Date Due   Zoster Vaccines- Shingrix (1 of 2) Never done    Colorectal cancer screening: No longer required.   Lung Cancer Screening: (Low Dose CT Chest recommended if Age 57-80 years, 30 pack-year currently smoking OR have quit w/in 15years.) does not qualify.   Lung Cancer Screening Referral: No  Additional Screening:  Hepatitis C Screening: does qualify; Completed 03/01/2017  Vision Screening: Recommended annual ophthalmology exams for early detection of glaucoma and other disorders of the eye. Is the patient up to date with their annual eye exam?  No  Who is the provider or what is the name of the office in which the patient attends annual eye exams? [facility] If pt is not established with a provider, would they like to be referred to a provider to establish care? No .   Dental Screening: Recommended annual dental exams for proper oral hygiene  Community Resource Referral / Chronic Care Management: CRR required this visit?  No   CCM required this visit?  No      Plan:     I have personally reviewed and noted the following in the patient's chart:   Medical and social history Use of alcohol, tobacco or illicit drugs  Current medications and supplements including  opioid prescriptions. Patient is not currently taking opioid prescriptions. Functional ability and status Nutritional status Physical activity Advanced directives List of other physicians Hospitalizations, surgeries, and ER visits in previous 12 months Vitals Screenings to include cognitive, depression, and falls Referrals and appointments  In addition, I have reviewed and discussed with patient certain  preventive protocols, quality metrics, and best practice recommendations. A written personalized care plan for preventive services as well as general preventive health recommendations were provided to patient.  I connected with  Alvester Chou on 09/23/20 by a video enabled telemedicine application and verified that I am speaking with the correct person using two identifiers.   I discussed the limitations of evaluation and management by telemedicine. The patient expressed understanding and agreed to proceed.      Irena Reichmann, [facility]   09/23/2020   Nurse Notes: Non Face to Face 60 minutes

## 2020-10-29 ENCOUNTER — Ambulatory Visit: Payer: Medicare HMO | Admitting: Internal Medicine

## 2020-10-29 ENCOUNTER — Encounter: Payer: Self-pay | Admitting: Internal Medicine

## 2020-10-29 ENCOUNTER — Other Ambulatory Visit: Payer: Self-pay

## 2020-10-29 DIAGNOSIS — R058 Other specified cough: Secondary | ICD-10-CM | POA: Diagnosis not present

## 2020-10-29 DIAGNOSIS — J449 Chronic obstructive pulmonary disease, unspecified: Secondary | ICD-10-CM

## 2020-10-29 DIAGNOSIS — F17210 Nicotine dependence, cigarettes, uncomplicated: Secondary | ICD-10-CM

## 2020-10-29 DIAGNOSIS — I10 Essential (primary) hypertension: Secondary | ICD-10-CM

## 2020-10-29 DIAGNOSIS — R69 Illness, unspecified: Secondary | ICD-10-CM | POA: Diagnosis not present

## 2020-10-29 DIAGNOSIS — J4489 Other specified chronic obstructive pulmonary disease: Secondary | ICD-10-CM

## 2020-10-29 NOTE — Assessment & Plan Note (Signed)
Onset mid aug 2022 -  D/c acei 09/13/2020 and rx for gerd > resolved 10/29/2020 so ok to taper off gerd rx and f/u gi if flares   Resolved to pt's satisfaction

## 2020-10-29 NOTE — Patient Instructions (Addendum)
Stop pantoprazole (the one before bfast) and change Pepcid to where you take it after breakfast and supper x 1 week, then just take at bedtime for a week then stop  - if the cough comes back you need to see your GI doctor  The key is to stop smoking completely before smoking completely stops you!  Low-dose CT lung cancer screening is recommended for patients who are 36-78 years of age with a 20+ pack-year history of smoking, and who are currently smoking or quit <=15 years ago > deferred to Jon Billings NP      If you are satisfied with your treatment plan,  let your doctor know and he/she can either refill your medications or you can return here when your prescription runs out.     If in any way you are not 100% satisfied,  please tell us.  If 100% better, tell your friends!  Pulmonary follow up is as needed

## 2020-10-29 NOTE — Progress Notes (Signed)
Matthew Randolph, male    DOB: 01/21/1940  MRN: [id number]   Brief patient profile: 16 yobm stopped smoking 08/2020 pattern of bronchitis x 2018 using saba prn then mid August 2022 severe cough with wheezing / sob > rec prednisone / tessalon/ tussionex / ventolin but continued with cough esp noct so referred to pulmonary clinic in [facility]   09/13/2020 by Jon Billings NP          History of Present Illness  09/13/2020  Pulmonary/ 1st office eval/ [facility] / [employer] / on ACEi  Chief Complaint  Patient presents with   Consult    Wheezing, coughing.   Dyspnea:  denies including push mower Cough: mostly non-productive worse to point of almost choking noct  Sleep: on side/ bed is flat/ one pillow  SABA use: not x 48 h, only helps a little   Rec Plan A = Automatic = Always=    Symbicort 80 Take 2 puffs first thing in am and then another 2 puffs about 12 hours later.  Work on inhaler technique:   Stop lisinopril and start valsartan 80 mg daily   Pantoprazole (protonix) 40 mg   Take  30-60 min before first meal of the day and Pepcid (famotidine)  20 mg after supper until return to office - this is the best way to tell whether stomach acid is contributing to your problem.   Plan B = Backup (to supplement plan A, not to replace it) Only use your albuterol inhaler as a rescue medication Prednisone 10 mg take  4 each am x 2 days,   2 each am x 2 days,  1 each am x 2 days and stop  Please remember to go to the  x-ray department  for your tests - we will call you with the results when they are available   Please schedule a follow up office visit in 6 weeks, call sooner if needed with all medications /inhalers/ solutions in hand   10/29/2020  f/u ov/Inez Rosato/ [facility] re: AB/ ? Acei case    maint on symbicort 80  Chief Complaint  Patient presents with   Follow-up    Upper airway syndrome  Dyspnea:  push mower 30 min Cough: almost completely gone off acei  Sleeping: bed is flat  one pillow SABA use: not needing  02: none  Covid status:   vax except 3    No obvious day to day or daytime variability or assoc excess/ purulent sputum or mucus plugs or hemoptysis or cp or chest tightness, subjective wheeze or overt sinus or hb symptoms.   Sleeping as above without nocturnal  or early am exacerbation  of respiratory  c/o's or need for noct saba. Also denies any obvious fluctuation of symptoms with weather or environmental changes or other aggravating or alleviating factors except as outlined above   No unusual exposure hx or h/o childhood pna/ asthma or knowledge of premature birth.  Current Allergies, Complete Past Medical History, Past Surgical History, Family History, and Social History were reviewed in [employer] record.  ROS  The following are not active complaints unless bolded Hoarseness, sore throat, dysphagia, dental problems, itching, sneezing,  nasal congestion or discharge of excess mucus or purulent secretions, ear ache,   fever, chills, sweats, unintended wt loss or wt gain, classically pleuritic or exertional cp,  orthopnea pnd or arm/hand swelling  or leg swelling, presyncope, palpitations, abdominal pain, anorexia, nausea, vomiting, diarrhea  or change in  bowel habits or change in bladder habits, change in stools or change in urine, dysuria, hematuria,  rash, arthralgias, visual complaints, headache, numbness, weakness or ataxia or problems with walking or coordination,  change in mood or  memory.        Current Meds  Medication Sig   albuterol (VENTOLIN HFA) 108 (90 Base) MCG/ACT inhaler Inhale 2 puffs into the lungs every 6 (six) hours as needed for wheezing or shortness of breath.   atorvastatin (LIPITOR) 10 MG tablet Take 1 tablet (10 mg total) by mouth daily.   benzonatate (TESSALON) 200 MG capsule Take 1 capsule (200 mg total) by mouth 2 (two) times daily as needed for cough.   budesonide-formoterol (SYMBICORT) 80-4.5 MCG/ACT  inhaler Take 2 puffs first thing in am and then another 2 puffs about 12 hours later.   chlorpheniramine-HYDROcodone (TUSSIONEX PENNKINETIC ER) 10-8 MG/5ML SUER Take 5 mLs by mouth every 12 (twelve) hours as needed.   famotidine (PEPCID) 20 MG tablet One after supper   fluticasone (FLONASE) 50 MCG/ACT nasal spray Place 2 sprays into both nostrils daily.   Menthol, Topical Analgesic, (ICY HOT EX) Apply 1 application topically daily as needed (pain).   methocarbamol (ROBAXIN) 500 MG tablet Take 1 tablet (500 mg total) by mouth every 6 (six) hours as needed for muscle spasms.   Multiple Vitamin (MULTIVITAMIN) tablet Take 1 tablet by mouth daily.   pantoprazole (PROTONIX) 40 MG tablet Take 1 tablet (40 mg total) by mouth daily. Take 30-60 min before first meal of the day   predniSONE (DELTASONE) 10 MG tablet Take  4 each am x 2 days,   2 each am x 2 days,  1 each am x 2 days and stop   valsartan (DIOVAN) 80 MG tablet Take 1 tablet (80 mg total) by mouth daily.           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   NECK :  without JVD/Nodes/TM/ nl carotid upstrokes bilaterally   LUNGS: no acc muscle use,  Min barrel  contour chest wall with bilateral  slightly decreased bs s audible wheeze and  without cough on insp or exp maneuvers and min   Hyperresonant  to  percussion bilaterally     CV:  RRR  no s3 or murmur or increase in P2, and no edema   ABD:  soft and nontender with pos end  insp Hoover's  in the supine position. No bruits or organomegaly appreciated, bowel sounds nl  MS:   Nl gait/  ext warm without deformities, calf tenderness, cyanosis or clubbing No obvious joint restrictions  SKIN: warm and dry without lesions    NEURO:  alert, approp, nl sensorium with  no motor or cerebellar deficits apparent.          Assessment     Outpatient Encounter Medications as of 10/29/2020  Medication Sig   albuterol (VENTOLIN HFA) 108 (90 Base) MCG/ACT inhaler Inhale 2 puffs into the lungs every 6 (six) hours as needed for wheezing or shortness of breath.   atorvastatin (LIPITOR) 10 MG tablet Take 1 tablet (10 mg total) by mouth daily.   budesonide-formoterol (SYMBICORT) 80-4.5 MCG/ACT inhaler Take 2 puffs first thing in am and then another 2 puffs about 12 hours later.   famotidine (PEPCID) 20 MG tablet One after supper   fluticasone (FLONASE) 50 MCG/ACT nasal spray Place 2 sprays into both nostrils daily.   Menthol, Topical Analgesic, (ICY HOT EX) Apply 1 application topically daily as needed (pain).   methocarbamol (ROBAXIN) 500 MG tablet Take 1 tablet (500 mg total) by mouth every 6 (six) hours as needed for muscle spasms.   Multiple Vitamin (MULTIVITAMIN) tablet Take 1 tablet by mouth daily.   valsartan (DIOVAN) 80 MG tablet Take 1 tablet (80 mg total) by mouth daily.

## 2020-10-29 NOTE — Assessment & Plan Note (Signed)
D/c acei 09/13/2020 due to atypical copd symptoms> resolved   Although even in retrospect it may not be clear the ACEi contributed to the pt's symptoms,  Pt improved off them and adding them back at this point or in the future would risk confusion in interpretation of non-specific respiratory symptoms to which this patient is prone  ie  Better not to muddy the waters here.   >>> f/u PCP off acei          Each maintenance medication was reviewed in detail including emphasizing most importantly the difference between maintenance and prns and under what circumstances the prns are to be triggered using an action plan format where appropriate.  Total time for H and P, chart review, counseling, reviewing hfa device(s) and generating customized AVS unique to this summary final  office visit / same day charting  > 30 min

## 2020-10-29 NOTE — Assessment & Plan Note (Signed)
Active smoker  - try off acei 09/13/2020 and on gerd rx > improved 10/29/2020 > continue symbicort 80   - The proper method of use, as well as anticipated side effects, of a metered-dose inhaler were discussed and demonstrated to the patient using teach back method.   Pulmonary f/u is prn

## 2020-12-15 DIAGNOSIS — I700 Atherosclerosis of aorta: Secondary | ICD-10-CM | POA: Insufficient documentation

## 2020-12-15 NOTE — Progress Notes (Signed)
BP (!) 107/53   Pulse (!) 52   Temp 98.6 F (37 C) (Oral)   Ht 5' 10.5" (1.791 m)   Wt 218 lb 12.8 oz (99.2 kg)   SpO2 96%   BMI 30.95 kg/m    Subjective:    Patient ID: Matthew Randolph, male    DOB: 03-03-1940, 80 y.o.   MRN: [id number]  HPI: MOLLY MASELLI is a 80 y.o. male presenting on 12/16/2020 for comprehensive medical examination. Current medical complaints include:none  He currently lives with: Interim Problems from his last visit: no  HYPERTENSION / HYPERLIPIDEMIA Satisfied with current treatment? no Duration of hypertension: years BP monitoring frequency: weekly BP range: 120-130/80s BP medication side effects: no Past BP meds: valsartan Duration of hyperlipidemia: years Cholesterol medication side effects: no Cholesterol supplements: none Past cholesterol medications: atorvastain (lipitor) Medication compliance: excellent compliance Aspirin: no Recent stressors: no Recurrent headaches: no Visual changes: no Palpitations: no Dyspnea: no Chest pain: no Lower extremity edema: no Dizzy/lightheaded: no  COPD COPD status: controlled Satisfied with current treatment?: yes Oxygen use: no Dyspnea frequency: no Cough frequency: no Rescue inhaler frequency:   Limitation of activity: no Productive cough:  Last Spirometry:  Pneumovax: Up to Date Influenza: Up to Date  CHRONIC KIDNEY DISEASE CKD status: controlled Medications renally dose: yes Previous renal evaluation: no Pneumovax:  Up to Date Influenza Vaccine:  Up to Date  Depression Screen done today and results listed below:  Depression screen [facility] 2/9 12/16/2020 09/23/2020 12/14/2019 09/18/2019 11/11/2018  Decreased Interest 0 0 0 0 0  Down, Depressed, Hopeless 0 0 0 0 0  PHQ - 2 Score 0 0 0 0 0  Altered sleeping 0 - - - -  Tired, decreased energy 0 - - - -  Change in appetite 0 - - - -  Feeling bad or failure about yourself  0 - - - -  Trouble concentrating 0 - - - -  Moving slowly or  fidgety/restless 0 - - - -  Suicidal thoughts 0 - - - -  PHQ-9 Score 0 - - - -  Difficult doing work/chores Not difficult at all - - - -    The patient does not have a history of falls. I did complete a risk assessment for falls. A plan of care for falls was documented.   Past Medical History:  Past Medical History:  Diagnosis Date   Arthritis    Benign tumor of pituitary gland ([place])    Chronic kidney disease    ED (erectile dysfunction)    Erectile dysfunction    GERD (gastroesophageal reflux disease)    High risk sexual behavior    Hyperlipidemia    Hypertension    Pneumonia 2012   Tobacco use disorder, continuous     Surgical History:  Past Surgical History:  Procedure Laterality Date   COLONOSCOPY  2015   LIPOMA EXCISION Left 10/04/2018   Procedure: EXCISION LIPOMA LEFT HIP;  Surgeon: Hessie Knows, MD;  Location: [facility] ORS;  Service: Orthopedics;  Laterality: Left;   PITUITARY SURGERY  12/10/2015   TOTAL HIP ARTHROPLASTY Right 09/09/2017   Procedure: TOTAL HIP ARTHROPLASTY ANTERIOR APPROACH;  Surgeon: Hessie Knows, MD;  Location: [facility] ORS;  Service: Orthopedics;  Laterality: Right;   TOTAL HIP ARTHROPLASTY Left 10/04/2018   Procedure: TOTAL HIP ARTHROPLASTY ANTERIOR APPROACH;  Surgeon: Hessie Knows, MD;  Location: [facility] ORS;  Service: Orthopedics;  Laterality: Left;    Medications:  Current Outpatient Medications on File Prior to  Visit  Medication Sig   acetaminophen (TYLENOL) 500 MG tablet Take 500 mg by mouth every 6 (six) hours as needed.   albuterol (VENTOLIN HFA) 108 (90 Base) MCG/ACT inhaler Inhale 2 puffs into the lungs every 6 (six) hours as needed for wheezing or shortness of breath.   budesonide-formoterol (SYMBICORT) 80-4.5 MCG/ACT inhaler Take 2 puffs first thing in am and then another 2 puffs about 12 hours later.   famotidine (PEPCID) 20 MG tablet One after supper   fluticasone (FLONASE) 50 MCG/ACT nasal spray Place 2 sprays into both nostrils daily.    Menthol, Topical Analgesic, (ICY HOT EX) Apply 1 application topically daily as needed (pain).   methocarbamol (ROBAXIN) 500 MG tablet Take 1 tablet (500 mg total) by mouth every 6 (six) hours as needed for muscle spasms.   Multiple Vitamin (MULTIVITAMIN) tablet Take 1 tablet by mouth daily.   valsartan (DIOVAN) 80 MG tablet Take 1 tablet (80 mg total) by mouth daily.   No current facility-administered medications on file prior to visit.    Allergies:  No Known Allergies  Social History:  Social History   Socioeconomic History   Marital status: Divorced    Spouse name: Not on file   Number of children: 4   Years of education: 12th   Highest education level: High school graduate  Occupational History   Occupation: Retired Visual merchandiser  Tobacco Use   Smoking status: Every Day    Packs/day: 0.25    Years: 40.00    Pack years: 10.00    Types: Cigarettes   Smokeless tobacco: Never   Tobacco comments:    2-3 cig daily 10/29/2020  Vaping Use   Vaping Use: Never used  Substance and Sexual Activity   Alcohol use: Not Currently   Drug use: No   Sexual activity: Yes    Partners: Female  Other Topics Concern   Not on file  Social History Narrative   ** Merged History Encounter **       Social Determinants of Health   Financial Resource Strain: Low Risk    Difficulty of Paying Living Expenses: Not hard at all  Food Insecurity: No Food Insecurity   Worried About Charity fundraiser in the Last Year: Never true   [place] in the Last Year: Never true  Transportation Needs: No Transportation Needs   Lack of Transportation (Medical): No   Lack of Transportation (Non-Medical): No  Physical Activity: Sufficiently Active   Days of Exercise per Week: 4 days   Minutes of Exercise per Session: 60 min  Stress: No Stress Concern Present   Feeling of Stress : Not at all  Social Connections: Moderately Isolated   Frequency of Communication with Friends and Family: More than  three times a week   Frequency of Social Gatherings with Friends and Family: Three times a week   Attends Religious Services: More than 4 times per year   Active Member of Clubs or Organizations: No   Attends Archivist Meetings: Never   Marital Status: Divorced  Human resources officer Violence: Not At Risk   Fear of Current or Ex-Partner: No   Emotionally Abused: No   Physically Abused: No   Sexually Abused: No   Social History   Tobacco Use  Smoking Status Every Day   Packs/day: 0.25   Years: 40.00   Pack years: 10.00   Types: Cigarettes  Smokeless Tobacco Never  Tobacco Comments   2-3 cig daily 10/29/2020  Social History   Substance and Sexual Activity  Alcohol Use Not Currently    Family History:  Family History  Problem Relation Age of Onset   Brain cancer Mother    Cancer Mother        Brain tumor   Thyroid disease Mother    Lung disease Mother        From [place]   Heart disease Father    Stroke Father    Hypertension Brother    Aneurysm Brother    Pneumonia Brother    Heart disease Sister        massive MI   Obesity Sister     Past medical history, surgical history, medications, allergies, family history and social history reviewed with patient today and changes made to appropriate areas of the chart.   Review of Systems  Eyes:  Negative for blurred vision and double vision.  Respiratory:  Negative for shortness of breath.   Cardiovascular:  Negative for chest pain, palpitations and leg swelling.  Neurological:  Negative for dizziness and headaches.  All other ROS negative except what is listed above and in the HPI.      Objective:    BP (!) 107/53   Pulse (!) 52   Temp 98.6 F (37 C) (Oral)   Ht 5' 10.5" (1.791 m)   Wt 218 lb 12.8 oz (99.2 kg)   SpO2 96%   BMI 30.95 kg/m   Wt Readings from Last 3 Encounters:  12/16/20 218 lb 12.8 oz (99.2 kg)  10/29/20 221 lb 3.2 oz (100.3 kg)  09/13/20 212 lb 9.6 oz (96.4 kg)    Physical  Exam Vitals and nursing note reviewed.  Constitutional:      General: He is not in acute distress.    Appearance: Normal appearance. He is not ill-appearing, toxic-appearing or diaphoretic.  HENT:     Head: Normocephalic.     Right Ear: Tympanic membrane, ear canal and external ear normal.     Left Ear: Tympanic membrane, ear canal and external ear normal.     Nose: Nose normal. No congestion or rhinorrhea.     Mouth/Throat:     Mouth: Mucous membranes are moist.  Eyes:     General:        Right eye: No discharge.        Left eye: No discharge.     Extraocular Movements: Extraocular movements intact.     Conjunctiva/sclera: Conjunctivae normal.     Pupils: Pupils are equal, round, and reactive to light.  Cardiovascular:     Rate and Rhythm: Normal rate and regular rhythm.     Heart sounds: No murmur heard. Pulmonary:     Effort: Pulmonary effort is normal. No respiratory distress.     Breath sounds: Normal breath sounds. No wheezing, rhonchi or rales.  Abdominal:     General: Abdomen is flat. Bowel sounds are normal. There is no distension.     Palpations: Abdomen is soft.     Tenderness: There is no abdominal tenderness. There is no guarding.  Musculoskeletal:     Cervical back: Normal range of motion and neck supple.  Skin:    General: Skin is warm and dry.     Capillary Refill: Capillary refill takes less than 2 seconds.  Neurological:     General: No focal deficit present.     Mental Status: He is alert and oriented to person, place, and time.     Cranial Nerves: No cranial nerve  deficit.     Motor: No weakness.     Deep Tendon Reflexes: Reflexes normal.  Psychiatric:        Mood and Affect: Mood normal.        Behavior: Behavior normal.        Thought Content: Thought content normal.        Judgment: Judgment normal.    Results for orders placed or performed in visit on 08/29/20  Comp Met (CMET)  Result Value Ref Range   Glucose 93 65 - 99 mg/dL   BUN 15 8 - 27  mg/dL   Creatinine, Ser 1.14 0.76 - 1.27 mg/dL   eGFR 66 >59 mL/min/1.73   BUN/Creatinine Ratio 13 10 - 24   Sodium 141 134 - 144 mmol/L   Potassium 4.3 3.5 - 5.2 mmol/L   Chloride 104 96 - 106 mmol/L   CO2 24 20 - 29 mmol/L   Calcium 9.6 8.6 - 10.2 mg/dL   Total Protein 6.8 6.0 - 8.5 g/dL   Albumin 4.1 3.7 - 4.7 g/dL   Globulin, Total 2.7 1.5 - 4.5 g/dL   Albumin/Globulin Ratio 1.5 1.2 - 2.2   Bilirubin Total 0.7 0.0 - 1.2 mg/dL   Alkaline Phosphatase 57 44 - 121 IU/L   AST 19 0 - 40 IU/L   ALT 20 0 - 44 IU/L  Lipid Profile  Result Value Ref Range   Cholesterol, Total 156 100 - 199 mg/dL   Triglycerides 104 0 - 149 mg/dL   HDL 65 >39 mg/dL   VLDL Cholesterol Cal 19 5 - 40 mg/dL   LDL Chol Calc (NIH) 72 0 - 99 mg/dL   Chol/HDL Ratio 2.4 0.0 - 5.0 ratio  CBC w/Diff  Result Value Ref Range   WBC 7.6 3.4 - 10.8 x10E3/uL   RBC 4.52 4.14 - 5.80 x10E6/uL   Hemoglobin 12.4 (L) 13.0 - 17.7 g/dL   Hematocrit 39.1 37.5 - 51.0 %   MCV 87 79 - 97 fL   MCH 27.4 26.6 - 33.0 pg   MCHC 31.7 31.5 - 35.7 g/dL   RDW 12.4 11.6 - 15.4 %   Platelets 268 150 - 450 x10E3/uL   Neutrophils 42 Not Estab. %   Lymphs 38 Not Estab. %   Monocytes 10 Not Estab. %   Eos 9 Not Estab. %   Basos 1 Not Estab. %   Neutrophils Absolute 3.2 1.4 - 7.0 x10E3/uL   Lymphocytes Absolute 2.9 0.7 - 3.1 x10E3/uL   Monocytes Absolute 0.8 0.1 - 0.9 x10E3/uL   EOS (ABSOLUTE) 0.7 (H) 0.0 - 0.4 x10E3/uL   Basophils Absolute 0.1 0.0 - 0.2 x10E3/uL   Immature Granulocytes 0 Not Estab. %   Immature Grans (Abs) 0.0 0.0 - 0.1 x10E3/uL      Assessment & Plan:   Problem List Items Addressed This Visit       Cardiovascular and Mediastinum   Essential hypertension    Chronic.  Controlled.  Continue with current medication regimen on Valsartan 80m daily.  Labs ordered today.  Discussed symptoms of low blood pressure.  Running low in office today but higher at home.  States he isn't feeling any symptoms and hasn't had  anything to eat or drink.  Return to clinic in 6 months for reevaluation.  Call sooner if concerns arise.        Relevant Medications   atorvastatin (LIPITOR) 10 MG tablet   Aortic atherosclerosis (HCC)    Chronic.  Controlled.  Continue  with current medication regimen on Atorvastatin daily.  Labs ordered today.  Return to clinic in 6 months for reevaluation.  Call sooner if concerns arise.        Relevant Medications   atorvastatin (LIPITOR) 10 MG tablet     Respiratory   Asthmatic bronchitis , chronic (HCC)    Chronic.  Controlled.  Continue with current medication regimen on Symbicort daily.  Labs ordered today.  Return to clinic in 6 months for reevaluation.  Call sooner if concerns arise.          Endocrine   Pituitary adenoma ([place])     Genitourinary   CKD (chronic kidney disease)    Chronic.  Controlled.  Continue with current medication regimen.  Labs ordered today.  Return to clinic in 6 months for reevaluation.  Call sooner if concerns arise.          Other   Hyperlipemia    Chronic.  Controlled.  Continue with current medication regimen on Atorvastatin 22m daily.  Refill sent today.  Labs ordered today.  Return to clinic in 6 months for reevaluation.  Call sooner if concerns arise.        Relevant Medications   atorvastatin (LIPITOR) 10 MG tablet   Other Visit Diagnoses     Annual physical exam    -  Primary   Health maintenance reviewed during visit. Labs ordered today. Discussed shingles shot.     Relevant Orders   TSH   PSA   Lipid panel   CBC with Differential/Platelet   Comprehensive metabolic panel   Urinalysis, Routine w reflex microscopic   Screening for STD (sexually transmitted disease)       Requested by patient during visit.    Relevant Orders   Chlamydia/Gonococcus/Trichomonas, NAA([facility])   RPR   HIV Antibody (routine testing w rflx)   Hepatitis C Antibody   HSV(herpes simplex vrs) 1+2 ab-IgG        Discussed aspirin prophylaxis  for myocardial infarction prevention and decision was it was not indicated  LABORATORY TESTING:  Health maintenance labs ordered today as discussed above.   The natural history of prostate cancer and ongoing controversy regarding screening and potential treatment outcomes of prostate cancer has been discussed with the patient. The meaning of a false positive PSA and a false negative PSA has been discussed. He indicates understanding of the limitations of this screening test and wishes to proceed with screening PSA testing.   IMMUNIZATIONS:   - Tdap: Tetanus vaccination status reviewed: last tetanus booster within 10 years. - Influenza: Up to date - Pneumovax: Up to date - Prevnar: Up to date - COVID: Up to date - HPV: Not applicable - Shingrix vaccine:  Discussed at visit today  SCREENING: - Colonoscopy: Up to date  Discussed with patient purpose of the colonoscopy is to detect colon cancer at curable precancerous or early stages   - AAA Screening: Not applicable  -Hearing Test: Not applicable  -Spirometry: Not applicable   PATIENT COUNSELING:    Sexuality: Discussed sexually transmitted diseases, partner selection, use of condoms, avoidance of unintended pregnancy  and contraceptive alternatives.   Advised to avoid cigarette smoking.  I discussed with the patient that most people either abstain from alcohol or drink within safe limits (<=14/week and <=4 drinks/occasion for males, <=7/weeks and <= 3 drinks/occasion for females) and that the risk for alcohol disorders and other health effects rises proportionally with the number of drinks per week and how often a drinker  exceeds daily limits.  Discussed cessation/primary prevention of drug use and availability of treatment for abuse.   Diet: Encouraged to adjust caloric intake to maintain  or achieve ideal body weight, to reduce intake of dietary saturated fat and total fat, to limit sodium intake by avoiding high sodium foods and  not adding table salt, and to maintain adequate dietary potassium and calcium preferably from fresh fruits, vegetables, and low-fat dairy products.    stressed the importance of regular exercise  Injury prevention: Discussed safety belts, safety helmets, smoke detector, smoking near bedding or upholstery.   Dental health: Discussed importance of regular tooth brushing, flossing, and dental visits.   Follow up plan: NEXT PREVENTATIVE PHYSICAL DUE IN 1 YEAR. Return in about 6 months (around 06/16/2021) for HTN, HLD, DM2 FU.

## 2020-12-16 ENCOUNTER — Other Ambulatory Visit: Payer: Self-pay

## 2020-12-16 ENCOUNTER — Encounter: Payer: Self-pay | Admitting: Nurse Practitioner

## 2020-12-16 ENCOUNTER — Ambulatory Visit (INDEPENDENT_AMBULATORY_CARE_PROVIDER_SITE_OTHER): Payer: Medicare HMO | Admitting: Nurse Practitioner

## 2020-12-16 VITALS — BP 107/53 | HR 52 | Temp 98.6°F | Ht 70.5 in | Wt 218.8 lb

## 2020-12-16 DIAGNOSIS — I10 Essential (primary) hypertension: Secondary | ICD-10-CM | POA: Diagnosis not present

## 2020-12-16 DIAGNOSIS — J449 Chronic obstructive pulmonary disease, unspecified: Secondary | ICD-10-CM | POA: Diagnosis not present

## 2020-12-16 DIAGNOSIS — Z113 Encounter for screening for infections with a predominantly sexual mode of transmission: Secondary | ICD-10-CM

## 2020-12-16 DIAGNOSIS — I700 Atherosclerosis of aorta: Secondary | ICD-10-CM | POA: Diagnosis not present

## 2020-12-16 DIAGNOSIS — Z0000 Encounter for general adult medical examination without abnormal findings: Secondary | ICD-10-CM

## 2020-12-16 DIAGNOSIS — D352 Benign neoplasm of pituitary gland: Secondary | ICD-10-CM | POA: Diagnosis not present

## 2020-12-16 DIAGNOSIS — N1831 Chronic kidney disease, stage 3a: Secondary | ICD-10-CM

## 2020-12-16 DIAGNOSIS — E782 Mixed hyperlipidemia: Secondary | ICD-10-CM | POA: Diagnosis not present

## 2020-12-16 DIAGNOSIS — Z136 Encounter for screening for cardiovascular disorders: Secondary | ICD-10-CM | POA: Diagnosis not present

## 2020-12-16 DIAGNOSIS — Z125 Encounter for screening for malignant neoplasm of prostate: Secondary | ICD-10-CM | POA: Diagnosis not present

## 2020-12-16 DIAGNOSIS — R69 Illness, unspecified: Secondary | ICD-10-CM | POA: Diagnosis not present

## 2020-12-16 LAB — URINALYSIS, ROUTINE W REFLEX MICROSCOPIC
Bilirubin, UA: NEGATIVE
Glucose, UA: NEGATIVE
Ketones, UA: NEGATIVE
Leukocytes,UA: NEGATIVE
Nitrite, UA: NEGATIVE
Protein,UA: NEGATIVE
RBC, UA: NEGATIVE
Specific Gravity, UA: 1.025 (ref 1.005–1.030)
Urobilinogen, Ur: 0.2 mg/dL (ref 0.2–1.0)
pH, UA: 5.5 (ref 5.0–7.5)

## 2020-12-16 MED ORDER — ATORVASTATIN CALCIUM 10 MG PO TABS: 10.0000 mg | ORAL_TABLET | Freq: Every day | ORAL | 1 refills | Status: DC

## 2020-12-16 NOTE — Assessment & Plan Note (Addendum)
Chronic.  Controlled.  Continue with current medication regimen on Atorvastatin 10mg daily.  Refill sent today.  Labs ordered today.  Return to clinic in 6 months for reevaluation.  Call sooner if concerns arise.   

## 2020-12-16 NOTE — Assessment & Plan Note (Signed)
Chronic.  Controlled.  Continue with current medication regimen on Atorvastatin daily.  Labs ordered today.  Return to clinic in 6 months for reevaluation.  Call sooner if concerns arise.

## 2020-12-16 NOTE — Assessment & Plan Note (Signed)
Chronic.  Controlled.  Continue with current medication regimen.  Labs ordered today.  Return to clinic in 6 months for reevaluation.  Call sooner if concerns arise.  ? ?

## 2020-12-16 NOTE — Assessment & Plan Note (Signed)
Chronic.  Controlled.  Continue with current medication regimen on Valsartan 80mg  daily.  Labs ordered today.  Discussed symptoms of low blood pressure.  Running low in office today but higher at home.  States he isn't feeling any symptoms and hasn't had anything to eat or drink.  Return to clinic in 6 months for reevaluation.  Call sooner if concerns arise.

## 2020-12-16 NOTE — Progress Notes (Signed)
Hi Matthew Randolph. Your urine from today looks good.  I will send you another message once the rest of your lab work comes back.

## 2020-12-16 NOTE — Assessment & Plan Note (Signed)
Chronic.  Controlled.  Continue with current medication regimen on Symbicort daily.  Labs ordered today.  Return to clinic in 6 months for reevaluation.  Call sooner if concerns arise.

## 2020-12-17 DIAGNOSIS — H547 Unspecified visual loss: Secondary | ICD-10-CM | POA: Diagnosis not present

## 2020-12-17 LAB — COMPREHENSIVE METABOLIC PANEL
ALT: 23 IU/L (ref 0–44)
AST: 20 IU/L (ref 0–40)
Albumin/Globulin Ratio: 1.6 (ref 1.2–2.2)
Albumin: 4.5 g/dL (ref 3.7–4.7)
Alkaline Phosphatase: 59 IU/L (ref 44–121)
BUN/Creatinine Ratio: 12 (ref 10–24)
BUN: 14 mg/dL (ref 8–27)
Bilirubin Total: 0.4 mg/dL (ref 0.0–1.2)
CO2: 23 mmol/L (ref 20–29)
Calcium: 9.6 mg/dL (ref 8.6–10.2)
Chloride: 102 mmol/L (ref 96–106)
Creatinine, Ser: 1.15 mg/dL (ref 0.76–1.27)
Globulin, Total: 2.8 g/dL (ref 1.5–4.5)
Glucose: 120 mg/dL — ABNORMAL HIGH (ref 70–99)
Potassium: 4.4 mmol/L (ref 3.5–5.2)
Sodium: 139 mmol/L (ref 134–144)
Total Protein: 7.3 g/dL (ref 6.0–8.5)
eGFR: 65 mL/min/{1.73_m2} (ref >59)

## 2020-12-17 LAB — LIPID PANEL
Chol/HDL Ratio: 3 ratio (ref 0.0–5.0)
Cholesterol, Total: 178 mg/dL (ref 100–199)
HDL: 59 mg/dL (ref >39)
LDL Chol Calc (NIH): 104 mg/dL — ABNORMAL HIGH (ref 0–99)
Triglycerides: 79 mg/dL (ref 0–149)
VLDL Cholesterol Cal: 15 mg/dL (ref 5–40)

## 2020-12-17 LAB — CBC WITH DIFFERENTIAL/PLATELET
Basophils Absolute: 0.1 10*3/uL (ref 0.0–0.2)
Basos: 1 %
EOS (ABSOLUTE): 0.1 10*3/uL (ref 0.0–0.4)
Eos: 2 %
Hematocrit: 43 % (ref 37.5–51.0)
Hemoglobin: 13.7 g/dL (ref 13.0–17.7)
Immature Grans (Abs): 0 10*3/uL (ref 0.0–0.1)
Immature Granulocytes: 0 %
Lymphocytes Absolute: 2.6 10*3/uL (ref 0.7–3.1)
Lymphs: 47 %
MCH: 27.9 pg (ref 26.6–33.0)
MCHC: 31.9 g/dL (ref 31.5–35.7)
MCV: 88 fL (ref 79–97)
Monocytes Absolute: 0.5 10*3/uL (ref 0.1–0.9)
Monocytes: 9 %
Neutrophils Absolute: 2.2 10*3/uL (ref 1.4–7.0)
Neutrophils: 41 %
Platelets: 278 10*3/uL (ref 150–450)
RBC: 4.91 x10E6/uL (ref 4.14–5.80)
RDW: 12.5 % (ref 11.6–15.4)
WBC: 5.4 10*3/uL (ref 3.4–10.8)

## 2020-12-17 LAB — RPR: RPR Ser Ql: NONREACTIVE

## 2020-12-17 LAB — HEPATITIS C ANTIBODY: Hep C Virus Ab: 0.5 s/co ratio (ref 0.0–0.9)

## 2020-12-17 LAB — HSV(HERPES SIMPLEX VRS) I + II AB-IGG
HSV 1 Glycoprotein G Ab, IgG: 33.3 index — ABNORMAL HIGH (ref 0.00–0.90)
HSV 2 IgG, Type Spec: 4.32 index — ABNORMAL HIGH (ref 0.00–0.90)

## 2020-12-17 LAB — TSH: TSH: 0.802 u[IU]/mL (ref 0.450–4.500)

## 2020-12-17 LAB — HIV ANTIBODY (ROUTINE TESTING W REFLEX): HIV Screen 4th Generation wRfx: NONREACTIVE

## 2020-12-17 LAB — HSV-2 IGG SUPPLEMENTAL TEST: HSV-2 IgG Supplemental Test: POSITIVE — AB

## 2020-12-17 LAB — PSA: Prostate Specific Ag, Serum: 3 ng/mL (ref 0.0–4.0)

## 2020-12-18 LAB — CHLAMYDIA/GONOCOCCUS/TRICHOMONAS, NAA
Chlamydia by NAA: NEGATIVE
Gonococcus by NAA: NEGATIVE
Trich vag by NAA: NEGATIVE

## 2020-12-18 NOTE — Progress Notes (Signed)
Please let patient know that overall his lab work looks good. Prostate cancer screen is within normal limits. His cholesterol is slightly elevated. Recommend decreasing the fat in his diet.  Otherwise, lab work looks good.  Please let me know if he has any questions.

## 2021-02-10 ENCOUNTER — Ambulatory Visit (INDEPENDENT_AMBULATORY_CARE_PROVIDER_SITE_OTHER): Payer: Medicare HMO | Admitting: Nurse Practitioner

## 2021-02-10 ENCOUNTER — Other Ambulatory Visit: Payer: Self-pay

## 2021-02-10 ENCOUNTER — Encounter: Payer: Self-pay | Admitting: Nurse Practitioner

## 2021-02-10 VITALS — BP 129/66 | HR 49 | Temp 98.7°F | Wt 216.0 lb

## 2021-02-10 DIAGNOSIS — J069 Acute upper respiratory infection, unspecified: Secondary | ICD-10-CM

## 2021-02-10 DIAGNOSIS — R062 Wheezing: Secondary | ICD-10-CM | POA: Diagnosis not present

## 2021-02-10 MED ORDER — IPRATROPIUM-ALBUTEROL 0.5-2.5 (3) MG/3ML IN SOLN
3.0000 mL | Freq: Once | RESPIRATORY_TRACT | Status: AC
  Administered 2021-02-10: 3 mL via RESPIRATORY_TRACT

## 2021-02-10 MED ORDER — PREDNISONE 10 MG PO TABS: 10.0000 mg | ORAL_TABLET | Freq: Every day | ORAL | 0 refills | Status: DC

## 2021-02-10 MED ORDER — AMOXICILLIN-POT CLAVULANATE 875-125 MG PO TABS: 1.0000 | ORAL_TABLET | Freq: Two times a day (BID) | ORAL | 0 refills | Status: AC

## 2021-02-10 MED ORDER — ALBUTEROL SULFATE HFA 108 (90 BASE) MCG/ACT IN AERS: 2.0000 | INHALATION_SPRAY | Freq: Four times a day (QID) | RESPIRATORY_TRACT | 3 refills | Status: DC | PRN

## 2021-02-10 MED ORDER — GUAIFENESIN-CODEINE 100-10 MG/5ML PO SOLN: 5.0000 mL | Freq: Two times a day (BID) | ORAL | 0 refills | Status: DC | PRN

## 2021-02-10 NOTE — Progress Notes (Signed)
BP 129/66 (BP Location: Right Arm, Cuff Size: Normal)    Pulse (!) 49    Temp 98.7 F (37.1 C) (Oral)    Wt 216 lb (98 kg)    SpO2 97%    BMI 30.55 kg/m    Subjective:    Patient ID: Matthew Randolph, male    DOB: [birth year], 81 y.o.   MRN: [id number]  HPI: Matthew Randolph is a 81 y.o. male  Chief Complaint  Patient presents with   URI    Pt states he has been having a productive cough and chest congestion for a week and a half. States he has taken OTC Robitussin with no relief.    UPPER RESPIRATORY TRACT INFECTION Worst symptom: symptoms having been going on for about a week and a half Fever: no Cough: yes- coughing up phlegm Shortness of breath: no Wheezing: no Chest pain: no Chest tightness: no Chest congestion: yes Nasal congestion: no Runny nose: no Post nasal drip: yes Sneezing: no Sore throat: yes Swollen glands: no Sinus pressure: no Headache: no Face pain: no Toothache: no Ear pain: no bilateral Ear pressure: no bilateral Eyes red/itching:no Eye drainage/crusting: no  Vomiting: no Rash: no Fatigue: no Sick contacts: no Strep contacts: no  Context: stable Recurrent sinusitis: no Relief with OTC cold/cough medications: no  Treatments attempted: cough syrup   Relevant past medical, surgical, family and social history reviewed and updated as indicated. Interim medical history since our last visit reviewed. Allergies and medications reviewed and updated.  Review of Systems  Constitutional:  Negative for fatigue and fever.  HENT:  Positive for congestion. Negative for ear pain, postnasal drip, rhinorrhea, sinus pressure, sinus pain, sneezing and sore throat.   Respiratory:  Positive for cough. Negative for chest tightness, shortness of breath and wheezing.   Gastrointestinal:  Negative for vomiting.  Skin:  Negative for rash.  Neurological:  Negative for headaches.   Per HPI unless specifically indicated above     Objective:    BP 129/66 (BP  Location: Right Arm, Cuff Size: Normal)    Pulse (!) 49    Temp 98.7 F (37.1 C) (Oral)    Wt 216 lb (98 kg)    SpO2 97%    BMI 30.55 kg/m   Wt Readings from Last 3 Encounters:  02/10/21 216 lb (98 kg)  12/16/20 218 lb 12.8 oz (99.2 kg)  10/29/20 221 lb 3.2 oz (100.3 kg)    Physical Exam Vitals and nursing note reviewed.  Constitutional:      General: He is not in acute distress.    Appearance: Normal appearance. He is not ill-appearing, toxic-appearing or diaphoretic.  HENT:     Head: Normocephalic.     Right Ear: External ear normal.     Left Ear: External ear normal.     Nose: Nose normal. No congestion or rhinorrhea.     Mouth/Throat:     Mouth: Mucous membranes are moist.  Eyes:     General:        Right eye: No discharge.        Left eye: No discharge.     Extraocular Movements: Extraocular movements intact.     Conjunctiva/sclera: Conjunctivae normal.     Pupils: Pupils are equal, round, and reactive to light.  Cardiovascular:     Rate and Rhythm: Normal rate and regular rhythm.     Heart sounds: No murmur heard. Pulmonary:     Effort: Pulmonary effort is normal. No respiratory  distress.     Breath sounds: Wheezing present. No rhonchi or rales.  Abdominal:     General: Abdomen is flat. Bowel sounds are normal.  Musculoskeletal:     Cervical back: Normal range of motion and neck supple.  Skin:    General: Skin is warm and dry.     Capillary Refill: Capillary refill takes less than 2 seconds.  Neurological:     General: No focal deficit present.     Mental Status: He is alert and oriented to person, place, and time.  Psychiatric:        Mood and Affect: Mood normal.        Behavior: Behavior normal.        Thought Content: Thought content normal.        Judgment: Judgment normal.    Results for orders placed or performed in visit on 12/16/20  Chlamydia/Gonococcus/Trichomonas, NAA([facility])   Specimen: Urine   UR  Result Value Ref Range   Chlamydia by NAA  Negative Negative   Gonococcus by NAA Negative Negative   Trich vag by NAA Negative Negative  TSH  Result Value Ref Range   TSH 0.802 0.450 - 4.500 uIU/mL  PSA  Result Value Ref Range   Prostate Specific Ag, Serum 3.0 0.0 - 4.0 ng/mL  Lipid panel  Result Value Ref Range   Cholesterol, Total 178 100 - 199 mg/dL   Triglycerides 79 0 - 149 mg/dL   HDL 59 >39 mg/dL   VLDL Cholesterol Cal 15 5 - 40 mg/dL   LDL Chol Calc (NIH) 104 (H) 0 - 99 mg/dL   Chol/HDL Ratio 3.0 0.0 - 5.0 ratio  CBC with Differential/Platelet  Result Value Ref Range   WBC 5.4 3.4 - 10.8 x10E3/uL   RBC 4.91 4.14 - 5.80 x10E6/uL   Hemoglobin 13.7 13.0 - 17.7 g/dL   Hematocrit 43.0 37.5 - 51.0 %   MCV 88 79 - 97 fL   MCH 27.9 26.6 - 33.0 pg   MCHC 31.9 31.5 - 35.7 g/dL   RDW 12.5 11.6 - 15.4 %   Platelets 278 150 - 450 x10E3/uL   Neutrophils 41 Not Estab. %   Lymphs 47 Not Estab. %   Monocytes 9 Not Estab. %   Eos 2 Not Estab. %   Basos 1 Not Estab. %   Neutrophils Absolute 2.2 1.4 - 7.0 x10E3/uL   Lymphocytes Absolute 2.6 0.7 - 3.1 x10E3/uL   Monocytes Absolute 0.5 0.1 - 0.9 x10E3/uL   EOS (ABSOLUTE) 0.1 0.0 - 0.4 x10E3/uL   Basophils Absolute 0.1 0.0 - 0.2 x10E3/uL   Immature Granulocytes 0 Not Estab. %   Immature Grans (Abs) 0.0 0.0 - 0.1 x10E3/uL  Comprehensive metabolic panel  Result Value Ref Range   Glucose 120 (H) 70 - 99 mg/dL   BUN 14 8 - 27 mg/dL   Creatinine, Ser 1.15 0.76 - 1.27 mg/dL   eGFR 65 >59 mL/min/1.73   BUN/Creatinine Ratio 12 10 - 24   Sodium 139 134 - 144 mmol/L   Potassium 4.4 3.5 - 5.2 mmol/L   Chloride 102 96 - 106 mmol/L   CO2 23 20 - 29 mmol/L   Calcium 9.6 8.6 - 10.2 mg/dL   Total Protein 7.3 6.0 - 8.5 g/dL   Albumin 4.5 3.7 - 4.7 g/dL   Globulin, Total 2.8 1.5 - 4.5 g/dL   Albumin/Globulin Ratio 1.6 1.2 - 2.2   Bilirubin Total 0.4 0.0 - 1.2 mg/dL   Alkaline Phosphatase  59 44 - 121 IU/L   AST 20 0 - 40 IU/L   ALT 23 0 - 44 IU/L  Urinalysis, Routine w reflex  microscopic  Result Value Ref Range   Specific Gravity, UA 1.025 1.005 - 1.030   pH, UA 5.5 5.0 - 7.5   Color, UA Yellow Yellow   Appearance Ur Clear Clear   Leukocytes,UA Negative Negative   Protein,UA Negative Negative/Trace   Glucose, UA Negative Negative   Ketones, UA Negative Negative   RBC, UA Negative Negative   Bilirubin, UA Negative Negative   Urobilinogen, Ur 0.2 0.2 - 1.0 mg/dL   Nitrite, UA Negative Negative  RPR  Result Value Ref Range   RPR Ser Ql Non Reactive Non Reactive  HIV Antibody (routine testing w rflx)  Result Value Ref Range   HIV Screen 4th Generation wRfx Non Reactive Non Reactive  Hepatitis C Antibody  Result Value Ref Range   Hep C Virus Ab 0.5 0.0 - 0.9 s/co ratio  HSV(herpes simplex vrs) 1+2 ab-IgG  Result Value Ref Range   HSV 1 Glycoprotein G Ab, IgG 33.30 (H) 0.00 - 0.90 index   HSV 2 IgG, Type Spec 4.32 (H) 0.00 - 0.90 index  HSV-2 IgG Supplemental Test  Result Value Ref Range   HSV-2 IgG Supplemental Test Positive (A) Negative      Assessment & Plan:   Problem List Items Addressed This Visit   None Visit Diagnoses     Viral upper respiratory tract infection    -  Primary   Will treat with Prednisone and Augmenting. Cough medicine with codeine given. Discussed s/s of worsening infection. Discussed poss chest xray. FU in 1 week.   Relevant Medications   ipratropium-albuterol (DUONEB) 0.5-2.5 (3) MG/3ML nebulizer solution 3 mL (Completed)   Wheezing       DUO neb given in office.  Breath sounds improved after treatment.  Discussed importance of using Albuterol PRN and Symbicort daily.        Follow up plan: Return if symptoms worsen or fail to improve.

## 2021-02-11 IMAGING — DX DG HIP (WITH OR WITHOUT PELVIS) 2-3V*L*
2 series · 2 of 2 positions shown · non-contrast
Comparison: Radiograph September 26, 2014.

CLINICAL DATA: Status post left total hip arthroplasty.

EXAM:
DG HIP (WITH OR WITHOUT PELVIS) 2-3V LEFT

[hip ap]
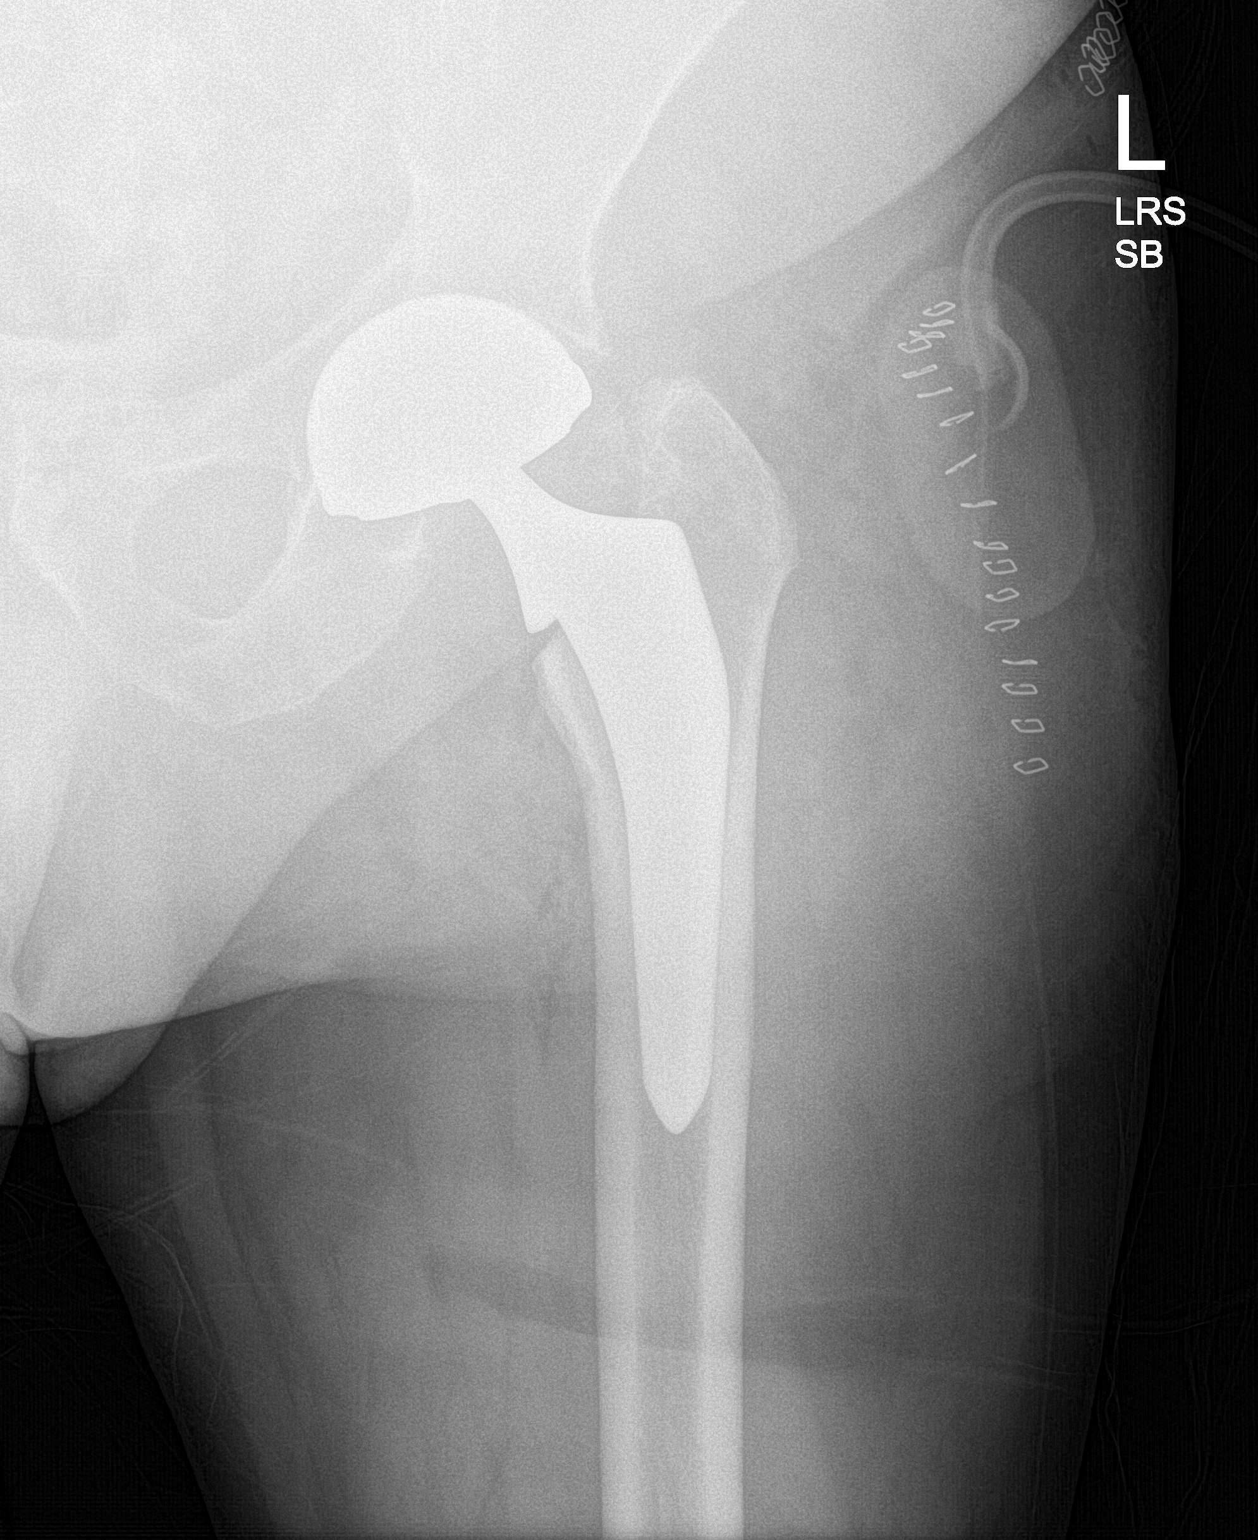

[hip lat]
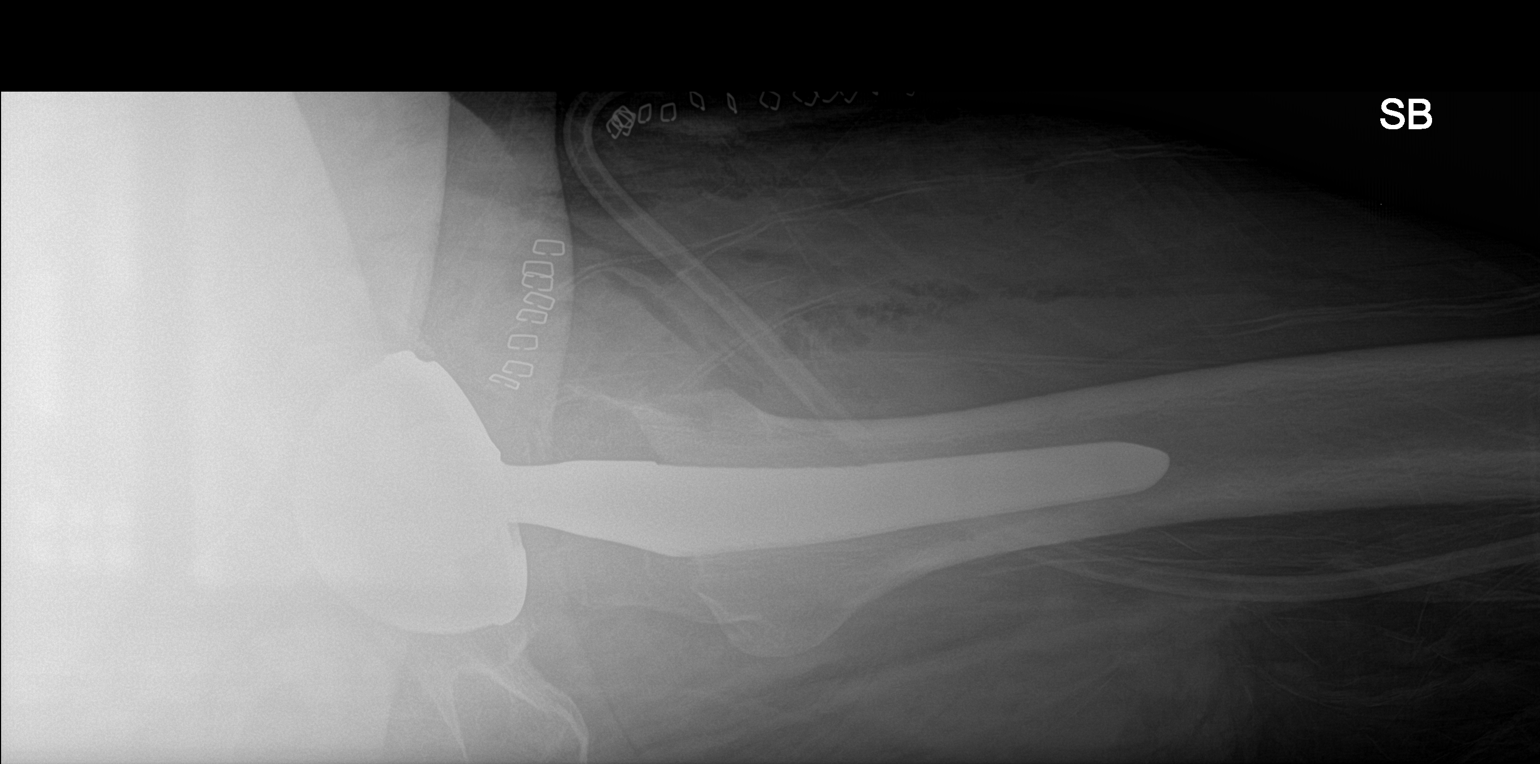

[2 of 2 positions shown; findings below may reference images not displayed]

FINDINGS: The left femoral and acetabular components appear to be well
situated. Expected postoperative changes are seen in the surrounding
soft tissues.
IMPRESSION: Status post left total hip arthroplasty.

## 2021-06-13 ENCOUNTER — Other Ambulatory Visit: Payer: Self-pay | Admitting: Nurse Practitioner

## 2021-06-13 NOTE — Telephone Encounter (Signed)
Requested medication (s) are due for refill today - expired Rx  Requested medication (s) are on the active medication list -yes  Future visit scheduled -yes  Last refill: 03/19/20 48g 3RF  Notes to clinic: expired Rx  Requested Prescriptions  Pending Prescriptions Disp Refills   fluticasone (FLONASE) 50 MCG/ACT nasal spray 48 g 3    Sig: Place 2 sprays into both nostrils daily.     Ear, Nose, and Throat: Nasal Preparations - Corticosteroids Passed - 06/13/2021 10:24 AM      Passed - Valid encounter within last 12 months    Recent Outpatient Visits           4 months ago Viral upper respiratory tract infection   [facility] Jon Billings, NP   5 months ago Annual physical exam   [facility] Jon Billings, NP   9 months ago Stage 3 chronic kidney disease, unspecified whether stage 3a or 3b CKD ([place])   [facility] Jon Billings, NP   9 months ago Cough   [place], Megan P, DO   1 year ago Routine general medical examination at a health care facility   [place], Barb Merino, DO       Future Appointments             In 6 days Jon Billings, NP [place], [facility]               Requested Prescriptions  Pending Prescriptions Disp Refills   fluticasone (FLONASE) 50 MCG/ACT nasal spray 48 g 3    Sig: Place 2 sprays into both nostrils daily.     Ear, Nose, and Throat: Nasal Preparations - Corticosteroids Passed - 06/13/2021 10:24 AM      Passed - Valid encounter within last 12 months    Recent Outpatient Visits           4 months ago Viral upper respiratory tract infection   [place], NP   5 months ago Annual physical exam   [facility] Jon Billings, NP   9 months ago Stage 3 chronic kidney disease, unspecified whether stage 3a or 3b CKD ([place])   [facility] Jon Billings, NP   9 months ago  Cough   [place], Megan P, DO   1 year ago Routine general medical examination at a health care facility   [facility] Valerie Roys, DO       Future Appointments             In 6 days Jon Billings, NP [facility], [place]

## 2021-06-13 NOTE — Telephone Encounter (Signed)
Medication Refill - Medication: fluticasone (FLONASE) 50 MCG/ACT nasal spray  Has the patient contacted their pharmacy? Yes.    Preferred Pharmacy (with phone number or street name):  [facility] [place], [place] to [facility] [employer] [facility] Phone:  [phone]  Fax:  [phone]     Has the patient been seen for an appointment in the last year OR does the patient have an upcoming appointment? Yes.    Agent: Please be advised that RX refills may take up to 3 business days. We ask that you follow-up with your pharmacy.

## 2021-06-15 MED ORDER — FLUTICASONE PROPIONATE 50 MCG/ACT NA SUSP: 2.0000 | Freq: Every day | NASAL | 3 refills | Status: DC

## 2021-06-16 ENCOUNTER — Ambulatory Visit: Payer: Medicare HMO | Admitting: Nurse Practitioner

## 2021-06-18 NOTE — Progress Notes (Signed)
BP (!) 122/49   Pulse (!) 42   Temp 97.6 F (36.4 C) (Oral)   Wt 213 lb 9.6 oz (96.9 kg)   SpO2 96%   BMI 30.22 kg/m    Subjective:    Patient ID: Matthew Randolph, male    DOB: 1940/08/02, 81 y.o.   MRN: [id number]  HPI: Matthew Randolph is a 81 y.o. male  Chief Complaint  Patient presents with   Hypertension   Hyperlipidemia   Diabetes   HYPERTENSION / [place] Satisfied with current treatment? yes Duration of hypertension: years BP monitoring frequency: rarely BP range: not sure BP medication side effects: no Past BP meds: valsartan Duration of hyperlipidemia: years Cholesterol medication side effects: no Cholesterol supplements: none Past cholesterol medications: atorvastain (lipitor) Medication compliance: excellent compliance Aspirin: no Recent stressors: no Recurrent headaches: no Visual changes: no Palpitations: no Dyspnea: no Chest pain: no Lower extremity edema: no Dizzy/lightheaded: no  CHRONIC KIDNEY DISEASE CKD status: controlled Medications renally dose: yes Previous renal evaluation: no Pneumovax:  Up to Date Influenza Vaccine:  Up to Date  ANEMIA Anemia status: controlled Etiology of anemia: CKD Duration of anemia treatment:  Compliance with treatment: excellent compliance Iron supplementation side effects: no Severity of anemia: mild Fatigue: no Decreased exercise tolerance: no  Dyspnea on exertion: no Palpitations: no Bleeding: no Pica: no     Relevant past medical, surgical, family and social history reviewed and updated as indicated. Interim medical history since our last visit reviewed. Allergies and medications reviewed and updated.  Review of Systems  Constitutional:  Negative for fatigue.  Eyes:  Negative for visual disturbance.  Respiratory:  Negative for chest tightness and shortness of breath.   Cardiovascular:  Negative for chest pain, palpitations and leg swelling.  Gastrointestinal:  Negative for blood in  stool and rectal pain.  Genitourinary:  Negative for hematuria.  Neurological:  Negative for dizziness, light-headedness and headaches.    Per HPI unless specifically indicated above     Objective:    BP (!) 122/49   Pulse (!) 42   Temp 97.6 F (36.4 C) (Oral)   Wt 213 lb 9.6 oz (96.9 kg)   SpO2 96%   BMI 30.22 kg/m   Wt Readings from Last 3 Encounters:  06/19/21 213 lb 9.6 oz (96.9 kg)  02/10/21 216 lb (98 kg)  12/16/20 218 lb 12.8 oz (99.2 kg)    Physical Exam Vitals and nursing note reviewed.  Constitutional:      General: He is not in acute distress.    Appearance: Normal appearance. He is not ill-appearing, toxic-appearing or diaphoretic.  HENT:     Head: Normocephalic.     Right Ear: External ear normal.     Left Ear: External ear normal.     Nose: Nose normal. No congestion or rhinorrhea.     Mouth/Throat:     Mouth: Mucous membranes are moist.  Eyes:     General:        Right eye: No discharge.        Left eye: No discharge.     Extraocular Movements: Extraocular movements intact.     Conjunctiva/sclera: Conjunctivae normal.     Pupils: Pupils are equal, round, and reactive to light.  Cardiovascular:     Rate and Rhythm: Normal rate and regular rhythm.     Heart sounds: No murmur heard. Pulmonary:     Effort: Pulmonary effort is normal. No respiratory distress.     Breath sounds: Normal  breath sounds. No wheezing, rhonchi or rales.  Abdominal:     General: Abdomen is flat. Bowel sounds are normal.  Musculoskeletal:     Cervical back: Normal range of motion and neck supple.  Skin:    General: Skin is warm and dry.     Capillary Refill: Capillary refill takes less than 2 seconds.  Neurological:     General: No focal deficit present.     Mental Status: He is alert and oriented to person, place, and time.  Psychiatric:        Mood and Affect: Mood normal.        Behavior: Behavior normal.        Thought Content: Thought content normal.         Judgment: Judgment normal.     Results for orders placed or performed in visit on 12/16/20  Chlamydia/Gonococcus/Trichomonas, NAA([facility])   Specimen: Urine   UR  Result Value Ref Range   Chlamydia by NAA Negative Negative   Gonococcus by NAA Negative Negative   Trich vag by NAA Negative Negative  TSH  Result Value Ref Range   TSH 0.802 0.450 - 4.500 uIU/mL  PSA  Result Value Ref Range   Prostate Specific Ag, Serum 3.0 0.0 - 4.0 ng/mL  Lipid panel  Result Value Ref Range   Cholesterol, Total 178 100 - 199 mg/dL   Triglycerides 79 0 - 149 mg/dL   HDL 59 >39 mg/dL   VLDL Cholesterol Cal 15 5 - 40 mg/dL   LDL Chol Calc (NIH) 104 (H) 0 - 99 mg/dL   Chol/HDL Ratio 3.0 0.0 - 5.0 ratio  CBC with Differential/Platelet  Result Value Ref Range   WBC 5.4 3.4 - 10.8 x10E3/uL   RBC 4.91 4.14 - 5.80 x10E6/uL   Hemoglobin 13.7 13.0 - 17.7 g/dL   Hematocrit 43.0 37.5 - 51.0 %   MCV 88 79 - 97 fL   MCH 27.9 26.6 - 33.0 pg   MCHC 31.9 31.5 - 35.7 g/dL   RDW 12.5 11.6 - 15.4 %   Platelets 278 150 - 450 x10E3/uL   Neutrophils 41 Not Estab. %   Lymphs 47 Not Estab. %   Monocytes 9 Not Estab. %   Eos 2 Not Estab. %   Basos 1 Not Estab. %   Neutrophils Absolute 2.2 1.4 - 7.0 x10E3/uL   Lymphocytes Absolute 2.6 0.7 - 3.1 x10E3/uL   Monocytes Absolute 0.5 0.1 - 0.9 x10E3/uL   EOS (ABSOLUTE) 0.1 0.0 - 0.4 x10E3/uL   Basophils Absolute 0.1 0.0 - 0.2 x10E3/uL   Immature Granulocytes 0 Not Estab. %   Immature Grans (Abs) 0.0 0.0 - 0.1 x10E3/uL  Comprehensive metabolic panel  Result Value Ref Range   Glucose 120 (H) 70 - 99 mg/dL   BUN 14 8 - 27 mg/dL   Creatinine, Ser 1.15 0.76 - 1.27 mg/dL   eGFR 65 >59 mL/min/1.73   BUN/Creatinine Ratio 12 10 - 24   Sodium 139 134 - 144 mmol/L   Potassium 4.4 3.5 - 5.2 mmol/L   Chloride 102 96 - 106 mmol/L   CO2 23 20 - 29 mmol/L   Calcium 9.6 8.6 - 10.2 mg/dL   Total Protein 7.3 6.0 - 8.5 g/dL   Albumin 4.5 3.7 - 4.7 g/dL   Globulin, Total 2.8  1.5 - 4.5 g/dL   Albumin/Globulin Ratio 1.6 1.2 - 2.2   Bilirubin Total 0.4 0.0 - 1.2 mg/dL   Alkaline Phosphatase 59 44 - 121  IU/L   AST 20 0 - 40 IU/L   ALT 23 0 - 44 IU/L  Urinalysis, Routine w reflex microscopic  Result Value Ref Range   Specific Gravity, UA 1.025 1.005 - 1.030   pH, UA 5.5 5.0 - 7.5   Color, UA Yellow Yellow   Appearance Ur Clear Clear   Leukocytes,UA Negative Negative   Protein,UA Negative Negative/Trace   Glucose, UA Negative Negative   Ketones, UA Negative Negative   RBC, UA Negative Negative   Bilirubin, UA Negative Negative   Urobilinogen, Ur 0.2 0.2 - 1.0 mg/dL   Nitrite, UA Negative Negative  RPR  Result Value Ref Range   RPR Ser Ql Non Reactive Non Reactive  HIV Antibody (routine testing w rflx)  Result Value Ref Range   HIV Screen 4th Generation wRfx Non Reactive Non Reactive  Hepatitis C Antibody  Result Value Ref Range   Hep C Virus Ab 0.5 0.0 - 0.9 s/co ratio  HSV(herpes simplex vrs) 1+2 ab-IgG  Result Value Ref Range   HSV 1 Glycoprotein G Ab, IgG 33.30 (H) 0.00 - 0.90 index   HSV 2 IgG, Type Spec 4.32 (H) 0.00 - 0.90 index  HSV-2 IgG Supplemental Test  Result Value Ref Range   HSV-2 IgG Supplemental Test Positive (A) Negative      Assessment & Plan:   Problem List Items Addressed This Visit       Cardiovascular and Mediastinum   Essential hypertension - Primary    Chronic.  Controlled.  Running on the low end with HR in the 40s.  Will decrease Valsartan to 62m daily.  Labs ordered today.  Discussed symptoms of low blood pressure.  Running low in office today but higher at home.  States he isn't feeling any symptoms and hasn't had anything to eat or drink.  Return to clinic in 6 months for reevaluation.  Call sooner if concerns arise.        Relevant Medications   valsartan (DIOVAN) 40 MG tablet   atorvastatin (LIPITOR) 10 MG tablet   Other Relevant Orders   Comp Met (CMET)   Aortic atherosclerosis (HCC)    Chronic.   Controlled.  Continue with current medication regimen on Atorvastatin daily.  Labs ordered today.  Return to clinic in 6 months for reevaluation.  Call sooner if concerns arise.        Relevant Medications   valsartan (DIOVAN) 40 MG tablet   atorvastatin (LIPITOR) 10 MG tablet     Respiratory   Asthmatic bronchitis , chronic (HCC)    Chronic.  Controlled.  Continue with current medication regimen on Symbicort daily.  Followed by Dr. WMelvyn Novas  Labs ordered today.  Return to clinic in 6 months for reevaluation.  Call sooner if concerns arise.          Endocrine   Pit[place] adenoma ([place]    Under good control on current regimen. Continue current regimen. Continue to monitor. Call with any concerns. Refills given. Labs drawn today.          Genitourinary   CKD (chronic kidney disease)    Chronic.  Controlled.  Continue with current medication regimen.  Labs ordered today.  Return to clinic in 6 months for reevaluation.  Call sooner if concerns arise.        Relevant Orders   CBC w/Diff     Other   Hyperlipemia    Chronic.  Controlled.  Continue with current medication regimen on Atorvastatin 146mdaily.  Refill sent today.  Labs ordered today.  Return to clinic in 6 months for reevaluation.  Call sooner if concerns arise.        Relevant Medications   valsartan (DIOVAN) 40 MG tablet   atorvastatin (LIPITOR) 10 MG tablet   Other Relevant Orders   Lipid Profile     Follow up plan: Return in about 6 months (around 12/19/2021) for Physical and Fasting labs.

## 2021-06-19 ENCOUNTER — Ambulatory Visit (INDEPENDENT_AMBULATORY_CARE_PROVIDER_SITE_OTHER): Payer: Medicare HMO | Admitting: Nurse Practitioner

## 2021-06-19 ENCOUNTER — Encounter: Payer: Self-pay | Admitting: Nurse Practitioner

## 2021-06-19 VITALS — BP 122/49 | HR 42 | Temp 97.6°F | Wt 213.6 lb

## 2021-06-19 DIAGNOSIS — D352 Benign neoplasm of pituitary gland: Secondary | ICD-10-CM | POA: Diagnosis not present

## 2021-06-19 DIAGNOSIS — I10 Essential (primary) hypertension: Secondary | ICD-10-CM | POA: Diagnosis not present

## 2021-06-19 DIAGNOSIS — E782 Mixed hyperlipidemia: Secondary | ICD-10-CM

## 2021-06-19 DIAGNOSIS — I700 Atherosclerosis of aorta: Secondary | ICD-10-CM | POA: Diagnosis not present

## 2021-06-19 DIAGNOSIS — N1831 Chronic kidney disease, stage 3a: Secondary | ICD-10-CM

## 2021-06-19 DIAGNOSIS — J449 Chronic obstructive pulmonary disease, unspecified: Secondary | ICD-10-CM

## 2021-06-19 MED ORDER — VALSARTAN 40 MG PO TABS: 40.0000 mg | ORAL_TABLET | Freq: Every day | ORAL | 1 refills | Status: DC

## 2021-06-19 MED ORDER — ATORVASTATIN CALCIUM 10 MG PO TABS: 10.0000 mg | ORAL_TABLET | Freq: Every day | ORAL | 1 refills | Status: DC

## 2021-06-19 NOTE — Assessment & Plan Note (Signed)
Chronic.  Controlled.  Continue with current medication regimen on Atorvastatin 10mg daily.  Refill sent today.  Labs ordered today.  Return to clinic in 6 months for reevaluation.  Call sooner if concerns arise.   

## 2021-06-19 NOTE — Assessment & Plan Note (Signed)
Chronic.  Controlled.  Continue with current medication regimen on Atorvastatin daily.  Labs ordered today.  Return to clinic in 6 months for reevaluation.  Call sooner if concerns arise.

## 2021-06-19 NOTE — Assessment & Plan Note (Signed)
Chronic.  Controlled.  Running on the low end with HR in the 40s.  Will decrease Valsartan to '40mg'$  daily.  Labs ordered today.  Discussed symptoms of low blood pressure.  Running low in office today but higher at home.  States he isn't feeling any symptoms and hasn't had anything to eat or drink.  Return to clinic in 6 months for reevaluation.  Call sooner if concerns arise.

## 2021-06-19 NOTE — Assessment & Plan Note (Signed)
Chronic.  Controlled.  Continue with current medication regimen.  Labs ordered today.  Return to clinic in 6 months for reevaluation.  Call sooner if concerns arise.  ? ?

## 2021-06-19 NOTE — Assessment & Plan Note (Signed)
Chronic.  Controlled.  Continue with current medication regimen on Symbicort daily.  Followed by Dr. Melvyn Novas.  Labs ordered today.  Return to clinic in 6 months for reevaluation.  Call sooner if concerns arise.

## 2021-06-19 NOTE — Assessment & Plan Note (Signed)
Under good control on current regimen. Continue current regimen. Continue to monitor. Call with any concerns. Refills given. Labs drawn today.   

## 2021-06-20 LAB — CBC WITH DIFFERENTIAL/PLATELET
Basophils Absolute: 0 10*3/uL (ref 0.0–0.2)
Basos: 1 %
EOS (ABSOLUTE): 0.1 10*3/uL (ref 0.0–0.4)
Eos: 2 %
Hematocrit: 40.2 % (ref 37.5–51.0)
Hemoglobin: 13 g/dL (ref 13.0–17.7)
Immature Grans (Abs): 0 10*3/uL (ref 0.0–0.1)
Immature Granulocytes: 0 %
Lymphocytes Absolute: 2.8 10*3/uL (ref 0.7–3.1)
Lymphs: 47 %
MCH: 28.1 pg (ref 26.6–33.0)
MCHC: 32.3 g/dL (ref 31.5–35.7)
MCV: 87 fL (ref 79–97)
Monocytes Absolute: 0.5 10*3/uL (ref 0.1–0.9)
Monocytes: 9 %
Neutrophils Absolute: 2.4 10*3/uL (ref 1.4–7.0)
Neutrophils: 41 %
Platelets: 286 10*3/uL (ref 150–450)
RBC: 4.62 x10E6/uL (ref 4.14–5.80)
RDW: 12.3 % (ref 11.6–15.4)
WBC: 5.8 10*3/uL (ref 3.4–10.8)

## 2021-06-20 LAB — COMPREHENSIVE METABOLIC PANEL
ALT: 21 IU/L (ref 0–44)
AST: 22 IU/L (ref 0–40)
Albumin/Globulin Ratio: 1.6 (ref 1.2–2.2)
Albumin: 4.4 g/dL (ref 3.7–4.7)
Alkaline Phosphatase: 51 IU/L (ref 44–121)
BUN/Creatinine Ratio: 12 (ref 10–24)
BUN: 13 mg/dL (ref 8–27)
Bilirubin Total: 0.7 mg/dL (ref 0.0–1.2)
CO2: 21 mmol/L (ref 20–29)
Calcium: 9.7 mg/dL (ref 8.6–10.2)
Chloride: 104 mmol/L (ref 96–106)
Creatinine, Ser: 1.12 mg/dL (ref 0.76–1.27)
Globulin, Total: 2.7 g/dL (ref 1.5–4.5)
Glucose: 100 mg/dL — ABNORMAL HIGH (ref 70–99)
Potassium: 4.8 mmol/L (ref 3.5–5.2)
Sodium: 140 mmol/L (ref 134–144)
Total Protein: 7.1 g/dL (ref 6.0–8.5)
eGFR: 67 mL/min/{1.73_m2} (ref >59)

## 2021-06-20 LAB — LIPID PANEL
Chol/HDL Ratio: 2.8 ratio (ref 0.0–5.0)
Cholesterol, Total: 154 mg/dL (ref 100–199)
HDL: 55 mg/dL (ref >39)
LDL Chol Calc (NIH): 82 mg/dL (ref 0–99)
Triglycerides: 91 mg/dL (ref 0–149)
VLDL Cholesterol Cal: 17 mg/dL (ref 5–40)

## 2021-07-21 ENCOUNTER — Encounter: Payer: Self-pay | Admitting: Nurse Practitioner

## 2021-07-22 ENCOUNTER — Telehealth: Payer: Self-pay

## 2021-07-22 DIAGNOSIS — R8281 Pyuria: Secondary | ICD-10-CM

## 2021-07-22 NOTE — Telephone Encounter (Signed)
Attempted to call pt regarding scheduling an office visit for his DME orders for a bedside commode. Left detailed message to return call to schedule the appt.   OK for [facility]/Nurse triage to schedule office visit if pt calls back.

## 2021-07-22 NOTE — Telephone Encounter (Signed)
Patient is going to have to come in for an appt.  This has not been documented before and medicare will want documentation to pay for this.

## 2021-08-21 ENCOUNTER — Encounter: Payer: Self-pay | Admitting: Emergency Medicine

## 2021-08-21 ENCOUNTER — Other Ambulatory Visit: Payer: Self-pay

## 2021-08-21 ENCOUNTER — Emergency Department
Admission: EM | Admit: 2021-08-21 | Discharge: 2021-08-21 | Disposition: A | Discharge status: Home / Self Care | Payer: Medicare HMO | Attending: Emergency Medicine | Admitting: Emergency Medicine

## 2021-08-21 DIAGNOSIS — I129 Hypertensive chronic kidney disease with stage 1 through stage 4 chronic kidney disease, or unspecified chronic kidney disease: Secondary | ICD-10-CM | POA: Diagnosis not present

## 2021-08-21 DIAGNOSIS — T675XXA Heat exhaustion, unspecified, initial encounter: Secondary | ICD-10-CM | POA: Diagnosis not present

## 2021-08-21 DIAGNOSIS — E860 Dehydration: Secondary | ICD-10-CM | POA: Diagnosis not present

## 2021-08-21 DIAGNOSIS — N189 Chronic kidney disease, unspecified: Secondary | ICD-10-CM | POA: Diagnosis not present

## 2021-08-21 DIAGNOSIS — R55 Syncope and collapse: Secondary | ICD-10-CM | POA: Diagnosis not present

## 2021-08-21 LAB — CBC
HCT: 40.2 % (ref 39.0–52.0)
Hemoglobin: 12 g/dL — ABNORMAL LOW (ref 13.0–17.0)
MCH: 27.4 pg (ref 26.0–34.0)
MCHC: 29.9 g/dL — ABNORMAL LOW (ref 30.0–36.0)
MCV: 91.8 fL (ref 80.0–100.0)
Platelets: 219 10*3/uL (ref 150–400)
RBC: 4.38 MIL/uL (ref 4.22–5.81)
RDW: 13.4 % (ref 11.5–15.5)
WBC: 7.2 10*3/uL (ref 4.0–10.5)
nRBC: 0 % (ref 0.0–0.2)

## 2021-08-21 LAB — BASIC METABOLIC PANEL
Anion gap: 9 (ref 5–15)
BUN: 18 mg/dL (ref 8–23)
CO2: 20 mmol/L — ABNORMAL LOW (ref 22–32)
Calcium: 8.3 mg/dL — ABNORMAL LOW (ref 8.9–10.3)
Chloride: 109 mmol/L (ref 98–111)
Creatinine, Ser: 1.75 mg/dL — ABNORMAL HIGH (ref 0.61–1.24)
GFR, Estimated: 39 mL/min — ABNORMAL LOW (ref >60)
Glucose, Bld: 103 mg/dL — ABNORMAL HIGH (ref 70–99)
Potassium: 4.6 mmol/L (ref 3.5–5.1)
Sodium: 138 mmol/L (ref 135–145)

## 2021-08-21 LAB — CK: Total CK: 109 U/L (ref 49–397)

## 2021-08-21 LAB — TROPONIN I (HIGH SENSITIVITY): Troponin I (High Sensitivity): 10 ng/L (ref <18)

## 2021-08-21 MED ORDER — LACTATED RINGERS IV BOLUS: 1000.0000 mL | Freq: Once | INTRAVENOUS | Status: DC

## 2021-08-21 NOTE — ED Notes (Signed)
Pt comes via [facility] from home with c/o LOC. Pt was outside mowing and per girlfriend he went unresponsive. Pt was alert to painful stimula at first and then A*OX4. HR was in 50s, BP-90/52 CBG-157  20 g in lac and 750 fluids given.

## 2021-08-21 NOTE — ED Provider Notes (Signed)
[facility] Provider Note    Event Date/Time   First MD Initiated Contact with Patient 08/21/21 1951     (approximate)   History   Chief Complaint: Heat Exposure   HPI  Matthew Randolph is a 81 y.o. male with a past history of CKD, GERD, hypertension who is brought to the ED due to syncope.  Patient reports that he ate some cereal for breakfast this morning, and then spent several hours in the hot weather mowing grass.  During this time he did not eat or drink any fluids.  By the time he was finished, he was feeling very fatigued.  He then spent several minutes of strenuous activity trying to loosen a hose nozzle that was firmly in place.  He then got lightheaded and passed out.  He denies any chest pain abdominal pain or back pain, no headache or vision changes.  He regained consciousness within seconds and was at normal mental status.  On route to the hospital, patient reports that EMS gave him 2 bags of IV fluids.  EMS reported giving 500 cc of fluid.  After this and being in a cool environment indoors, patient reports that he feels back to normal.  Denies any dizziness or orthostatic symptoms.     Physical Exam   Triage Vital Signs: ED Triage Vitals  Enc Vitals Group     BP 08/21/21 1439 (!) 116/54     Pulse Rate 08/21/21 1439 (!) 104     Resp 08/21/21 1439 18     Temp 08/21/21 1439 97.6 F (36.4 C)     Temp Source 08/21/21 1439 Oral     SpO2 08/21/21 1439 100 %     Weight 08/21/21 1436 180 lb (81.6 kg)     Height 08/21/21 1436 '5\' 10"'$  (1.778 m)     Head Circumference --      Peak Flow --      Pain Score 08/21/21 1436 0     Pain Loc --      Pain Edu? --      Excl. in [place]? --     Most recent vital signs: Vitals:   08/21/21 2007 08/21/21 2030  BP:  134/74  Pulse:  (!) 46  Resp: 15 15  Temp:    SpO2:  100%    General: Awake, no distress.  CV:  Good peripheral perfusion.  Bradycardia heart rate 50 Resp:  Normal effort.  Clear to  auscultation bilaterally Abd:  No distention.  Soft nontender Other:  Dry mucous membranes   ED Results / Procedures / Treatments   Labs (all labs ordered are listed, but only abnormal results are displayed) Labs Reviewed  BASIC METABOLIC PANEL - Abnormal; Notable for the following components:      Result Value   CO2 20 (*)    Glucose, Bld 103 (*)    Creatinine, Ser 1.75 (*)    Calcium 8.3 (*)    GFR, Estimated 39 (*)    All other components within normal limits  CBC - Abnormal; Notable for the following components:   Hemoglobin 12.0 (*)    MCHC 29.9 (*)    All other components within normal limits  CK  URINALYSIS, ROUTINE W REFLEX MICROSCOPIC  CBG MONITORING, ED  TROPONIN I (HIGH SENSITIVITY)     EKG Interpreted by me Sinus bradycardia, rate of 53.  Left axis, first-degree AV block.  Poor R wave progression.  Normal ST segments and T waves.  No acute ischemic changes.  Compared to previous EKGs in 2020 and 2019, no interval change.   RADIOLOGY    PROCEDURES:  Procedures   MEDICATIONS ORDERED IN ED: Medications - No data to display   IMPRESSION / MDM / [place] / ED COURSE  I reviewed the triage vital signs and the nursing notes.                              Differential diagnosis includes, but is not limited to, heat exhaustion, dehydration, AKI, electrolyte abnormality, anemia  Patient's presentation is most consistent with acute presentation with potential threat to life or bodily function.  Patient presents with syncope, apparently due to heat exhaustion and dehydration and compounded by insufficient food intake.  After some IV fluids and time in a cool environment, patient is energetic, good spirits, feels back to normal and wishes to be discharged.  Offered additional IV fluids due to the mild AKI seen on creatinine of 1.7, patient declines and wishes to go home so that he can eat dinner.  He will set a goal of drinking at least 32 ounces of  fluids tonight before bed.   Considering the patient's symptoms, medical history, and physical examination today, I have low suspicion for ACS, PE, TAD, pneumothorax, carditis, mediastinitis, pneumonia, CHF, or sepsis.  His bradycardia is asymptomatic and chronic for many years compared to previous EKGs.  Patient also notes that about 8 years ago he saw cardiology for bradycardia and was told that this was his normal physiologic state.      FINAL CLINICAL IMPRESSION(S) / ED DIAGNOSES   Final diagnoses:  Dehydration  Heat exhaustion, initial encounter     Rx / DC Orders   ED Discharge Orders     None        Note:  This document was prepared using Dragon voice recognition software and may include unintentional dictation errors.   Carrie Mew, MD 08/21/21 2212

## 2021-08-21 NOTE — ED Triage Notes (Signed)
Patient to ED via [facility] for heat exposure. Patient was mowing his yard and per family friend he was unresponsive. Patient now AOX4 and thinks he was just dehydrated.  18 L AC and 500 NACL from EMS

## 2021-09-08 ENCOUNTER — Other Ambulatory Visit: Payer: Self-pay | Admitting: Nurse Practitioner

## 2021-09-10 NOTE — Telephone Encounter (Signed)
Refilled 06/19/2021 #90 1 refill - confirmed by same pharmacy. Requested Prescriptions  Pending Prescriptions Disp Refills  . atorvastatin (LIPITOR) 10 MG tablet [Pharmacy Med Name: ATORVASTATIN TAB '10MG'$ ] 90 tablet 1    Sig: TAKE 1 TABLET DAILY     Cardiovascular:  Antilipid - Statins Failed - 09/08/2021  2:21 AM      Failed - Lipid Panel in normal range within the last 12 months    Cholesterol, Total  Date Value Ref Range Status  06/19/2021 154 100 - 199 mg/dL Final   Cholesterol Piccolo, Waived  Date Value Ref Range Status  01/23/2015 243 (H) <200 mg/dL Final    Comment:                            Desirable                <200                         Borderline High      200- 239                         High                     >239    LDL Chol Calc (NIH)  Date Value Ref Range Status  06/19/2021 82 0 - 99 mg/dL Final   HDL  Date Value Ref Range Status  06/19/2021 55 >39 mg/dL Final   Triglycerides  Date Value Ref Range Status  06/19/2021 91 0 - 149 mg/dL Final   Triglycerides Piccolo,Waived  Date Value Ref Range Status  01/23/2015 107 <150 mg/dL Final    Comment:                            Normal                   <150                         Borderline High     150 - 199                         High                200 - 499                         Very High                >499          Passed - Patient is not pregnant      Passed - Valid encounter within last 12 months    Recent Outpatient Visits          2 months ago Essential hypertension   [facility] Jon Billings, NP   7 months ago Viral upper respiratory tract infection   [facility] Jon Billings, NP   8 months ago Annual physical exam   [facility] Jon Billings, NP   1 year ago Stage 3 chronic kidney disease, unspecified whether stage 3a or 3b CKD ([place])   Wilson, Karen, NP   1 year ago Cough   [facility]  Valerie Roys, DO      Future Appointments            In 3 months Jon Billings, NP [facility] [place]

## 2021-09-22 ENCOUNTER — Ambulatory Visit: Payer: Medicare HMO

## 2021-09-23 ENCOUNTER — Telehealth: Payer: Self-pay | Admitting: Nurse Practitioner

## 2021-09-23 NOTE — Telephone Encounter (Signed)
Copied from [place] [id number]. Topic: Medicare AWV >> Sep 23, 2021  4:36 PM Josephina Gip wrote: Reason for CRM:  Left message for patient to call back and schedule the Medicare Annual Wellness Visit (AWV) virtually or by telephone.  Last AWV 09/23/20  Please schedule at anytime with [facility]Nurse Health Advisor.   Any questions, please call me at [phone]

## 2021-10-18 ENCOUNTER — Encounter: Payer: Self-pay | Admitting: Nurse Practitioner

## 2021-11-07 ENCOUNTER — Telehealth: Payer: Self-pay | Admitting: Nurse Practitioner

## 2021-11-07 NOTE — Telephone Encounter (Signed)
Copied from [place] [id number]. Topic: Medicare AWV >> Nov 07, 2021 12:33 PM Josephina Gip wrote: Reason for CRM:  Left message for patient to call back and schedule the Medicare Annual Wellness Visit (AWV) virtually or by telephone.  Last AWV 09/23/20  Please schedule at anytime with [facility]-Nurse Health Advisor.    Any questions, please call me at [phone]

## 2021-11-18 ENCOUNTER — Ambulatory Visit (INDEPENDENT_AMBULATORY_CARE_PROVIDER_SITE_OTHER): Payer: Medicare HMO | Admitting: *Deleted

## 2021-11-18 VITALS — BP 155/67

## 2021-11-18 DIAGNOSIS — Z0000 Encounter for general adult medical examination without abnormal findings: Secondary | ICD-10-CM

## 2021-11-18 NOTE — Patient Instructions (Signed)
Matthew Randolph , Thank you for taking time to come for your Medicare Wellness Visit. I appreciate your ongoing commitment to your health goals. Please review the following plan we discussed and let me know if I can assist you in the future.   These are the goals we discussed:  Goals      Patient Stated     09/18/2019, wants to get down to 200 pounds     Quit Smoking     Smoking cessation discussed     Quit smoking / using tobacco     Smoking cessation discussed      Weight (lb) < 200 lb (90.7 kg)        This is a list of the screening recommended for you and due dates:  Health Maintenance  Topic Date Due   Zoster (Shingles) Vaccine (1 of 2) Never done   Flu Shot  08/05/2021   COVID-19 Vaccine (5 - Moderna series) 12/13/2021   Medicare Annual Wellness Visit  11/19/2022   Tetanus Vaccine  08/20/2026   Pneumonia Vaccine  Completed   Hepatitis C Screening: USPSTF Recommendation to screen - Ages 18-79 yo.  Completed   HPV Vaccine  Aged Out   Colon Cancer Screening  Discontinued    Advanced directives: Education provided  Conditions/risks identified:   Next appointment: Follow up in one year for your annual wellness visit. 12-18-2021  @ 9:20  Preventive Care 65 Years and Older, Male  Preventive care refers to lifestyle choices and visits with your health care provider that can promote health and wellness. What does preventive care include? A yearly physical exam. This is also called an annual well check. Dental exams once or twice a year. Routine eye exams. Ask your health care provider how often you should have your eyes checked. Personal lifestyle choices, including: Daily care of your teeth and gums. Regular physical activity. Eating a healthy diet. Avoiding tobacco and drug use. Limiting alcohol use. Practicing safe sex. Taking low doses of aspirin every day. Taking vitamin and mineral supplements as recommended by your health care provider. What happens during an annual  well check? The services and screenings done by your health care provider during your annual well check will depend on your age, overall health, lifestyle risk factors, and family history of disease. Counseling  Your health care provider may ask you questions about your: Alcohol use. Tobacco use. Drug use. Emotional well-being. Home and relationship well-being. Sexual activity. Eating habits. History of falls. Memory and ability to understand (cognition). Work and work Statistician. Screening  You may have the following tests or measurements: Height, weight, and BMI. Blood pressure. Lipid and cholesterol levels. These may be checked every 5 years, or more frequently if you are over 65 years old. Skin check. Lung cancer screening. You may have this screening every year starting at age 83 if you have a 30-pack-year history of smoking and currently smoke or have quit within the past 15 years. Fecal occult blood test (FOBT) of the stool. You may have this test every year starting at age 31. Flexible sigmoidoscopy or colonoscopy. You may have a sigmoidoscopy every 5 years or a colonoscopy every 10 years starting at age 66. Prostate cancer screening. Recommendations will vary depending on your family history and other risks. Hepatitis C blood test. Hepatitis B blood test. Sexually transmitted disease (STD) testing. Diabetes screening. This is done by checking your blood sugar (glucose) after you have not eaten for a while (fasting). You may have  this done every 1-3 years. Abdominal aortic aneurysm (AAA) screening. You may need this if you are a current or former smoker. Osteoporosis. You may be screened starting at age 70 if you are at high risk. Talk with your health care provider about your test results, treatment options, and if necessary, the need for more tests. Vaccines  Your health care provider may recommend certain vaccines, such as: Influenza vaccine. This is recommended every  year. Tetanus, diphtheria, and acellular pertussis (Tdap, Td) vaccine. You may need a Td booster every 10 years. Zoster vaccine. You may need this after age 20. Pneumococcal 13-valent conjugate (PCV13) vaccine. One dose is recommended after age 75. Pneumococcal polysaccharide (PPSV23) vaccine. One dose is recommended after age 59. Talk to your health care provider about which screenings and vaccines you need and how often you need them. This information is not intended to replace advice given to you by your health care provider. Make sure you discuss any questions you have with your health care provider. Document Released: 01/18/2015 Document Revised: 09/11/2015 Document Reviewed: 10/23/2014 Elsevier Interactive Patient Education  2017 [place] Prevention in the Home Falls can cause injuries. They can happen to people of all ages. There are many things you can do to make your home safe and to help prevent falls. What can I do on the outside of my home? Regularly fix the edges of walkways and driveways and fix any cracks. Remove anything that might make you trip as you walk through a door, such as a raised step or threshold. Trim any bushes or trees on the path to your home. Use bright outdoor lighting. Clear any walking paths of anything that might make someone trip, such as rocks or tools. Regularly check to see if handrails are loose or broken. Make sure that both sides of any steps have handrails. Any raised decks and porches should have guardrails on the edges. Have any leaves, snow, or ice cleared regularly. Use sand or salt on walking paths during winter. Clean up any spills in your garage right away. This includes oil or grease spills. What can I do in the bathroom? Use night lights. Install grab bars by the toilet and in the tub and shower. Do not use towel bars as grab bars. Use non-skid mats or decals in the tub or shower. If you need to sit down in the shower, use a  plastic, non-slip stool. Keep the floor dry. Clean up any water that spills on the floor as soon as it happens. Remove soap buildup in the tub or shower regularly. Attach bath mats securely with double-sided non-slip rug tape. Do not have throw rugs and other things on the floor that can make you trip. What can I do in the bedroom? Use night lights. Make sure that you have a light by your bed that is easy to reach. Do not use any sheets or blankets that are too big for your bed. They should not hang down onto the floor. Have a firm chair that has side arms. You can use this for support while you get dressed. Do not have throw rugs and other things on the floor that can make you trip. What can I do in the kitchen? Clean up any spills right away. Avoid walking on wet floors. Keep items that you use a lot in easy-to-reach places. If you need to reach something above you, use a strong step stool that has a grab bar. Keep electrical cords out  of the way. Do not use floor polish or wax that makes floors slippery. If you must use wax, use non-skid floor wax. Do not have throw rugs and other things on the floor that can make you trip. What can I do with my stairs? Do not leave any items on the stairs. Make sure that there are handrails on both sides of the stairs and use them. Fix handrails that are broken or loose. Make sure that handrails are as long as the stairways. Check any carpeting to make sure that it is firmly attached to the stairs. Fix any carpet that is loose or worn. Avoid having throw rugs at the top or bottom of the stairs. If you do have throw rugs, attach them to the floor with carpet tape. Make sure that you have a light switch at the top of the stairs and the bottom of the stairs. If you do not have them, ask someone to add them for you. What else can I do to help prevent falls? Wear shoes that: Do not have high heels. Have rubber bottoms. Are comfortable and fit you  well. Are closed at the toe. Do not wear sandals. If you use a stepladder: Make sure that it is fully opened. Do not climb a closed stepladder. Make sure that both sides of the stepladder are locked into place. Ask someone to hold it for you, if possible. Clearly mark and make sure that you can see: Any grab bars or handrails. First and last steps. Where the edge of each step is. Use tools that help you move around (mobility aids) if they are needed. These include: Canes. Walkers. Scooters. Crutches. Turn on the lights when you go into a dark area. Replace any light bulbs as soon as they burn out. Set up your furniture so you have a clear path. Avoid moving your furniture around. If any of your floors are uneven, fix them. If there are any pets around you, be aware of where they are. Review your medicines with your doctor. Some medicines can make you feel dizzy. This can increase your chance of falling. Ask your doctor what other things that you can do to help prevent falls. This information is not intended to replace advice given to you by your health care provider. Make sure you discuss any questions you have with your health care provider. Document Released: 10/18/2008 Document Revised: 05/30/2015 Document Reviewed: 01/26/2014 Elsevier Interactive Patient Education  2017 [employer].

## 2021-11-18 NOTE — Progress Notes (Signed)
Subjective:   Matthew Randolph is a 81 y.o. male who presents for Medicare Annual/Subsequent preventive examination.  I connected with  Alvester Chou on 11/18/21 by a telephone enabled telemedicine application and verified that I am speaking with the correct person using two identifiers.   I discussed the limitations of evaluation and management by telemedicine. The patient expressed understanding and agreed to proceed.  Patient location: home  Provider location:  Tele-health-home     Review of Systems     Cardiac Risk Factors include: advanced age (>53mn, >>66women);male gender;obesity (BMI >30kg/m2);smoking/ tobacco exposure;family history of premature cardiovascular disease;hypertension     Objective:    Today's Vitals   11/18/21 1133  BP: (!) 155/67   There is no height or weight on file to calculate BMI.     11/18/2021   11:37 AM 08/21/2021    2:37 PM 09/23/2020    5:13 PM 09/18/2019    3:20 PM 10/04/2018    3:00 PM 10/04/2018    9:36 AM 08/18/2018   11:45 AM  Advanced Directives  Does Patient Have a Medical Advance Directive? No No Yes No No No No  Type of Advance Directive   [place]     Does patient want to make changes to medical advance directive?   No - Patient declined      Would patient like information on creating a medical advance directive? No - Patient declined    No - Patient declined No - Patient declined     Current Medications (verified) Outpatient Encounter Medications as of 11/18/2021  Medication Sig   acetaminophen (TYLENOL) 500 MG tablet Take 500 mg by mouth every 6 (six) hours as needed.   albuterol (VENTOLIN HFA) 108 (90 Base) MCG/ACT inhaler Inhale 2 puffs into the lungs every 6 (six) hours as needed for wheezing or shortness of breath.   atorvastatin (LIPITOR) 10 MG tablet Take 1 tablet (10 mg total) by mouth daily.   budesonide-formoterol (SYMBICORT) 80-4.5 MCG/ACT inhaler Take 2 puffs first thing in am and then another  2 puffs about 12 hours later.   famotidine (PEPCID) 20 MG tablet One after supper   fluticasone (FLONASE) 50 MCG/ACT nasal spray Place 2 sprays into both nostrils daily.   Menthol, Topical Analgesic, (ICY HOT EX) Apply 1 application topically daily as needed (pain).   Multiple Vitamin (MULTIVITAMIN) tablet Take 1 tablet by mouth daily.   valsartan (DIOVAN) 40 MG tablet Take 1 tablet (40 mg total) by mouth daily.   No facility-administered encounter medications on file as of 11/18/2021.    Allergies (verified) Patient has no known allergies.   History: Past Medical History:  Diagnosis Date   Arthritis    Benign tumor of pituitary gland ([place]    Chronic kidney disease    ED (erectile dysfunction)    Erectile dysfunction    GERD (gastroesophageal reflux disease)    High risk sexual behavior    Hyperlipidemia    Hypertension    Pneumonia 2012   Tobacco use disorder, continuous    Past Surgical History:  Procedure Laterality Date   COLONOSCOPY  2015   LIPOMA EXCISION Left 10/04/2018   Procedure: EXCISION LIPOMA LEFT HIP;  Surgeon: MHessie Knows MD;  Location: [facility] ORS;  Service: Orthopedics;  Laterality: Left;   PITUITARY SURGERY  12/10/2015   TOTAL HIP ARTHROPLASTY Right 09/09/2017   Procedure: TOTAL HIP ARTHROPLASTY ANTERIOR APPROACH;  Surgeon: MHessie Knows MD;  Location: [facility] ORS;  Service:  Orthopedics;  Laterality: Right;   TOTAL HIP ARTHROPLASTY Left 10/04/2018   Procedure: TOTAL HIP ARTHROPLASTY ANTERIOR APPROACH;  Surgeon: Hessie Knows, MD;  Location: [facility] ORS;  Service: Orthopedics;  Laterality: Left;   Family History  Problem Relation Age of Onset   Brain cancer Mother    Cancer Mother        Brain tumor   Thyroid disease Mother    Lung disease Mother        From [place]   Heart disease Father    Stroke Father    Hypertension Brother    Aneurysm Brother    Pneumonia Brother    Heart disease Sister        massive MI   Obesity Sister    Social History    Socioeconomic History   Marital status: Divorced    Spouse name: Not on file   Number of children: 4   Years of education: 12th   Highest education level: High school graduate  Occupational History   Occupation: Retired Visual merchandiser  Tobacco Use   Smoking status: Every Day    Packs/day: 0.25    Years: 40.00    Total pack years: 10.00    Types: Cigarettes   Smokeless tobacco: Never   Tobacco comments:    2-3 cig daily 10/29/2020  Vaping Use   Vaping Use: Never used  Substance and Sexual Activity   Alcohol use: Not Currently   Drug use: No   Sexual activity: Yes    Partners: Female  Other Topics Concern   Not on file  Social History Narrative   ** Merged History Encounter **       Social Determinants of Health   Financial Resource Strain: Low Risk  (11/18/2021)   Overall Financial Resource Strain (CARDIA)    Difficulty of Paying Living Expenses: Not hard at all  Food Insecurity: No Food Insecurity (11/18/2021)   Hunger Vital Sign    Worried About Running Out of Food in the Last Year: Never true    Ran Out of Food in the Last Year: Never true  Transportation Needs: No Transportation Needs (11/18/2021)   PRAPARE - Hydrologist (Medical): No    Lack of Transportation (Non-Medical): No  Physical Activity: Insufficiently Active (11/18/2021)   Exercise Vital Sign    Days of Exercise per Week: 3 days    Minutes of Exercise per Session: 20 min  Stress: No Stress Concern Present (11/18/2021)   [place]    Feeling of Stress : Not at all  Social Connections: Moderately Isolated (11/18/2021)   Social Connection and Isolation Panel [NHANES]    Frequency of Communication with Friends and Family: Three times a week    Frequency of Social Gatherings with Friends and Family: Once a week    Attends Religious Services: More than 4 times per year    Active Member of [employer] or  Organizations: No    Attends Archivist Meetings: Never    Marital Status: Divorced    Tobacco Counseling Ready to quit: Not Answered Counseling given: Not Answered Tobacco comments: 2-3 cig daily 10/29/2020   Clinical Intake:  Pre-visit preparation completed: Yes  Pain : No/denies pain     Diabetes: No  How often do you need to have someone help you when you read instructions, pamphlets, or other written materials from your doctor or pharmacy?: 1 - Never  Diabetic? no  Interpreter Needed?:  No  Information entered by :: Leroy Kennedy LPN   Activities of Daily Living    11/18/2021   11:42 AM 12/16/2020    7:59 AM  In your present state of health, do you have any difficulty performing the following activities:  Hearing? 1 0  Vision? 0 0  Difficulty concentrating or making decisions? 0 0  Walking or climbing stairs? 0 0  Dressing or bathing? 0 0  Doing errands, shopping? 0 0  Preparing Food and eating ? N   Using the Toilet? N   In the past six months, have you accidently leaked urine? N   Do you have problems with loss of bowel control? N   Managing your Medications? N   Managing your Finances? N   Housekeeping or managing your Housekeeping? N     Patient Care Team: Jon Billings, NP as PCP - General Kate Sable, MD as PCP - Cardiology (Cardiology) Derrill Memo, MD as Referring Physician (Neurosurgery) Greg Cutter, LCSW as [facility] (Licensed Clinical Social Worker) Minor, Dalbert Garnet, RN (Inactive) as [place] any recent [place] you may have received from other than [facility] providers in the past year (date may be approximate).     Assessment:   This is a routine wellness examination for Timon.  Hearing/Vision screen Hearing Screening - Comments:: No hearing aids Some hearing loss l   Vision Screening - Comments:: Up to date [facility]  Dietary  issues and exercise activities discussed: Current Exercise Habits: Home exercise routine, Time (Minutes): 15, Frequency (Times/Week): 4, Weekly Exercise (Minutes/Week): 60, Intensity: Mild   Goals Addressed             This Visit's Progress    Weight (lb) < 200 lb (90.7 kg)         Depression Screen    11/18/2021   11:45 AM 06/19/2021   10:08 AM 02/10/2021   10:13 AM 12/16/2020    8:04 AM 09/23/2020    5:07 PM 12/14/2019    8:56 AM 09/18/2019    3:23 PM  PHQ 2/9 Scores  PHQ - 2 Score 0 0 0 0 0 0 0  PHQ- 9 Score 0 0 0 0       Fall Risk    11/18/2021   11:37 AM 06/19/2021   10:08 AM 02/10/2021   10:13 AM 12/16/2020    8:04 AM 09/23/2020    5:14 PM  Fall Risk   Falls in the past year? 0 0 0 0 0  Number falls in past yr: 0 0 0 0 0  Injury with Fall? 0 0 0 0 0  Risk for fall due to :  No Fall Risks No Fall Risks No Fall Risks No Fall Risks  Follow up Education provided;Falls prevention discussed;Falls evaluation completed Falls evaluation completed Falls evaluation completed Falls evaluation completed Falls evaluation completed    FALL RISK PREVENTION PERTAINING TO THE HOME:  Any stairs in or around the home? No  If so, are there any without handrails? No  Home free of loose throw rugs in walkways, pet beds, electrical cords, etc? Yes  Adequate lighting in your home to reduce risk of falls? Yes   ASSISTIVE DEVICES UTILIZED TO PREVENT FALLS:  Life alert? No  Use of a cane, walker or w/c? Yes  Grab bars in the bathroom? Yes  Shower chair or bench in shower? No  Elevated toilet seat or a handicapped toilet? Yes  TIMED UP AND GO:  Was the test performed? No .    Cognitive Function:        11/18/2021   11:40 AM 09/23/2020    5:16 PM 09/18/2019    3:27 PM 08/05/2017    9:27 AM 07/29/2016   11:52 AM  6CIT Screen  What Year? 0 points 0 points 0 points 0 points 0 points  What month? 0 points 3 points 0 points 0 points 0 points  What time? 0 points 0 points 0 points 0  points 0 points  Count back from 20 0 points 0 points 2 points 0 points 0 points  Months in reverse 4 points 2 points 4 points 0 points 0 points  Repeat phrase 0 points 0 points 0 points 2 points 6 points  Total Score 4 points 5 points 6 points 2 points 6 points    Immunizations Immunization History  Administered Date(s) Administered   COVID-19, mRNA, vaccine(Comirnaty)12 years and older 10/18/2021   Fluad Quad(high Dose 65+) 10/16/2020   Influenza, High Dose Seasonal PF 10/26/2016, 10/19/2017, 09/19/2018, 12/14/2019   Influenza-Unspecified 10/15/2015   Moderna Sars-Covid-2 Vaccination 03/11/2019, 04/15/2019, 09/30/2020, 10/18/2021   Pneumococcal Conjugate-13 12/13/2013   Pneumococcal Polysaccharide-23 04/10/2008   Pneumococcal-Unspecified 04/10/2008   Td 10/05/2005, 10/05/2005, 08/19/2016   Zoster, Live 04/10/2008    TDAP status: Up to date  Flu Vaccine status: Due, Education has been provided regarding the importance of this vaccine. Advised may receive this vaccine at local pharmacy or Health Dept. Aware to provide a copy of the vaccination record if obtained from local pharmacy or Health Dept. Verbalized acceptance and understanding.  Pneumococcal vaccine status: Up to date  Covid-19 vaccine status: Completed vaccines  Qualifies for Shingles Vaccine? Yes   Zostavax completed Yes   Shingrix Completed?: No.    Education has been provided regarding the importance of this vaccine. Patient has been advised to call insurance company to determine out of pocket expense if they have not yet received this vaccine. Advised may also receive vaccine at local pharmacy or Health Dept. Verbalized acceptance and understanding.  Screening Tests Health Maintenance  Topic Date Due   Zoster Vaccines- Shingrix (1 of 2) Never done   INFLUENZA VACCINE  08/05/2021   COVID-19 Vaccine (5 - Moderna series) 12/13/2021   Medicare Annual Wellness (AWV)  11/19/2022   TETANUS/TDAP  08/20/2026    Pneumonia Vaccine 60+ Years old  Completed   Hepatitis C Screening  Completed   HPV VACCINES  Aged Out   COLONOSCOPY (Pts 45-54yr Insurance coverage will need to be confirmed)  Discontinued    Health Maintenance  Health Maintenance Due  Topic Date Due   Zoster Vaccines- Shingrix (1 of 2) Never done   INFLUENZA VACCINE  08/05/2021    Colorectal cancer screening: No longer required.   Lung Cancer Screening: (Low Dose CT Chest recommended if Age 79-80years, 30 pack-year currently smoking OR have quit w/in 15years.) does not qualify.   Lung Cancer Screening Referral:   Additional Screening:  Hepatitis C Screening: does not qualify; Completed 2022  Vision Screening: Recommended annual ophthalmology exams for early detection of glaucoma and other disorders of the eye. Is the patient up to date with their annual eye exam?  Yes  Who is the provider or what is the name of the office in which the patient attends annual eye exams? [facility] If pt is not established with a provider, would they like to be referred to a provider to establish care?  No .   Dental Screening: Recommended annual dental exams for proper oral hygiene  Community Resource Referral / Chronic Care Management: CRR required this visit?  No   CCM required this visit?  No      Plan:     I have personally reviewed and noted the following in the patient's chart:   Medical and social history Use of alcohol, tobacco or illicit drugs  Current medications and supplements including opioid prescriptions. Patient is not currently taking opioid prescriptions. Functional ability and status Nutritional status Physical activity Advanced directives List of other physicians Hospitalizations, surgeries, and ER visits in previous 12 months Vitals Screenings to include cognitive, depression, and falls Referrals and appointments  In addition, I have reviewed and discussed with patient certain preventive protocols,  quality metrics, and best practice recommendations. A written personalized care plan for preventive services as well as general preventive health recommendations were provided to patient.     Leroy Kennedy, LPN   90/30/0923   Nurse Notes:

## 2021-11-22 ENCOUNTER — Encounter: Payer: Self-pay | Admitting: Nurse Practitioner

## 2021-11-22 ENCOUNTER — Other Ambulatory Visit: Payer: Self-pay | Admitting: Internal Medicine

## 2021-11-24 ENCOUNTER — Encounter: Payer: Self-pay | Admitting: Nurse Practitioner

## 2021-11-24 MED ORDER — MELOXICAM 15 MG PO TABS: 15.0000 mg | ORAL_TABLET | Freq: Every day | ORAL | 0 refills | Status: DC

## 2021-11-24 MED ORDER — BUDESONIDE-FORMOTEROL FUMARATE 80-4.5 MCG/ACT IN AERO
INHALATION_SPRAY | RESPIRATORY_TRACT | 12 refills | Status: DC
Start: 2021-11-24 — End: 2022-06-19

## 2021-11-24 MED ORDER — FLUTICASONE PROPIONATE 50 MCG/ACT NA SUSP
2.0000 | Freq: Every day | NASAL | 3 refills | Status: DC
Start: 2021-11-24 — End: 2022-06-16

## 2021-12-17 NOTE — Progress Notes (Unsigned)
There were no vitals taken for this visit.   Subjective:    Patient ID: Matthew Randolph, male    DOB: 26-Mar-1940, 81 y.o.   MRN: [id number]  HPI: Matthew Randolph is a 81 y.o. male  No chief complaint on file.  HYPERTENSION / HYPERLIPIDEMIA Satisfied with current treatment? yes Duration of hypertension: years BP monitoring frequency: rarely BP range: not sure BP medication side effects: no Past BP meds: valsartan Duration of hyperlipidemia: years Cholesterol medication side effects: no Cholesterol supplements: none Past cholesterol medications: atorvastain (lipitor) Medication compliance: excellent compliance Aspirin: no Recent stressors: no Recurrent headaches: no Visual changes: no Palpitations: no Dyspnea: no Chest pain: no Lower extremity edema: no Dizzy/lightheaded: no  CHRONIC KIDNEY DISEASE CKD status: controlled Medications renally dose: yes Previous renal evaluation: no Pneumovax:  Up to Date Influenza Vaccine:  Up to Date  ANEMIA Anemia status: controlled Etiology of anemia: CKD Duration of anemia treatment:  Compliance with treatment: excellent compliance Iron supplementation side effects: no Severity of anemia: mild Fatigue: no Decreased exercise tolerance: no  Dyspnea on exertion: no Palpitations: no Bleeding: no Pica: no     Relevant past medical, surgical, family and social history reviewed and updated as indicated. Interim medical history since our last visit reviewed. Allergies and medications reviewed and updated.  Review of Systems  Constitutional:  Negative for fatigue.  Eyes:  Negative for visual disturbance.  Respiratory:  Negative for chest tightness and shortness of breath.   Cardiovascular:  Negative for chest pain, palpitations and leg swelling.  Gastrointestinal:  Negative for blood in stool and rectal pain.  Genitourinary:  Negative for hematuria.  Neurological:  Negative for dizziness, light-headedness and headaches.     Per HPI unless specifically indicated above     Objective:    There were no vitals taken for this visit.  Wt Readings from Last 3 Encounters:  08/21/21 180 lb (81.6 kg)  06/19/21 213 lb 9.6 oz (96.9 kg)  02/10/21 216 lb (98 kg)    Physical Exam Vitals and nursing note reviewed.  Constitutional:      General: He is not in acute distress.    Appearance: Normal appearance. He is not ill-appearing, toxic-appearing or diaphoretic.  HENT:     Head: Normocephalic.     Right Ear: External ear normal.     Left Ear: External ear normal.     Nose: Nose normal. No congestion or rhinorrhea.     Mouth/Throat:     Mouth: Mucous membranes are moist.  Eyes:     General:        Right eye: No discharge.        Left eye: No discharge.     Extraocular Movements: Extraocular movements intact.     Conjunctiva/sclera: Conjunctivae normal.     Pupils: Pupils are equal, round, and reactive to light.  Cardiovascular:     Rate and Rhythm: Normal rate and regular rhythm.     Heart sounds: No murmur heard. Pulmonary:     Effort: Pulmonary effort is normal. No respiratory distress.     Breath sounds: Normal breath sounds. No wheezing, rhonchi or rales.  Abdominal:     General: Abdomen is flat. Bowel sounds are normal.  Musculoskeletal:     Cervical back: Normal range of motion and neck supple.  Skin:    General: Skin is warm and dry.     Capillary Refill: Capillary refill takes less than 2 seconds.  Neurological:     General:  No focal deficit present.     Mental Status: He is alert and oriented to person, place, and time.  Psychiatric:        Mood and Affect: Mood normal.        Behavior: Behavior normal.        Thought Content: Thought content normal.        Judgment: Judgment normal.    Results for orders placed or performed during the hospital encounter of 35/46/56  Basic metabolic panel  Result Value Ref Range   Sodium 138 135 - 145 mmol/L   Potassium 4.6 3.5 - 5.1 mmol/L    Chloride 109 98 - 111 mmol/L   CO2 20 (L) 22 - 32 mmol/L   Glucose, Bld 103 (H) 70 - 99 mg/dL   BUN 18 8 - 23 mg/dL   Creatinine, Ser 1.75 (H) 0.61 - 1.24 mg/dL   Calcium 8.3 (L) 8.9 - 10.3 mg/dL   GFR, Estimated 39 (L) >60 mL/min   Anion gap 9 5 - 15  CBC  Result Value Ref Range   WBC 7.2 4.0 - 10.5 K/uL   RBC 4.38 4.22 - 5.81 MIL/uL   Hemoglobin 12.0 (L) 13.0 - 17.0 g/dL   HCT 40.2 39.0 - 52.0 %   MCV 91.8 80.0 - 100.0 fL   MCH 27.4 26.0 - 34.0 pg   MCHC 29.9 (L) 30.0 - 36.0 g/dL   RDW 13.4 11.5 - 15.5 %   Platelets 219 150 - 400 K/uL   nRBC 0.0 0.0 - 0.2 %  CK  Result Value Ref Range   Total CK 109 49 - 397 U/L  Troponin I (High Sensitivity)  Result Value Ref Range   Troponin I (High Sensitivity) 10 <18 ng/L      Assessment & Plan:   Problem List Items Addressed This Visit      Cardiovascular and Mediastinum   Essential hypertension   Aortic atherosclerosis ([place]) - Primary     Other   Hyperlipemia   Anemia     Follow up plan: No follow-ups on file.

## 2021-12-18 ENCOUNTER — Ambulatory Visit (INDEPENDENT_AMBULATORY_CARE_PROVIDER_SITE_OTHER): Payer: Medicare HMO | Admitting: Nurse Practitioner

## 2021-12-18 ENCOUNTER — Other Ambulatory Visit: Payer: Self-pay | Admitting: Nurse Practitioner

## 2021-12-18 ENCOUNTER — Encounter: Payer: Self-pay | Admitting: Nurse Practitioner

## 2021-12-18 VITALS — BP 116/54 | HR 47 | Temp 98.2°F | Ht 71.2 in | Wt 215.5 lb

## 2021-12-18 DIAGNOSIS — Z0000 Encounter for general adult medical examination without abnormal findings: Secondary | ICD-10-CM | POA: Diagnosis not present

## 2021-12-18 DIAGNOSIS — Z125 Encounter for screening for malignant neoplasm of prostate: Secondary | ICD-10-CM | POA: Diagnosis not present

## 2021-12-18 DIAGNOSIS — Z113 Encounter for screening for infections with a predominantly sexual mode of transmission: Secondary | ICD-10-CM

## 2021-12-18 DIAGNOSIS — E782 Mixed hyperlipidemia: Secondary | ICD-10-CM

## 2021-12-18 DIAGNOSIS — R69 Illness, unspecified: Secondary | ICD-10-CM | POA: Diagnosis not present

## 2021-12-18 DIAGNOSIS — R7309 Other abnormal glucose: Secondary | ICD-10-CM

## 2021-12-18 DIAGNOSIS — I700 Atherosclerosis of aorta: Secondary | ICD-10-CM

## 2021-12-18 DIAGNOSIS — I10 Essential (primary) hypertension: Secondary | ICD-10-CM

## 2021-12-18 DIAGNOSIS — D649 Anemia, unspecified: Secondary | ICD-10-CM | POA: Diagnosis not present

## 2021-12-18 LAB — URINALYSIS, ROUTINE W REFLEX MICROSCOPIC
Bilirubin, UA: NEGATIVE
Glucose, UA: NEGATIVE
Ketones, UA: NEGATIVE
Leukocytes,UA: NEGATIVE
Nitrite, UA: NEGATIVE
Protein,UA: NEGATIVE
RBC, UA: NEGATIVE
Specific Gravity, UA: 1.02 (ref 1.005–1.030)
Urobilinogen, Ur: 0.2 mg/dL (ref 0.2–1.0)
pH, UA: 5 (ref 5.0–7.5)

## 2021-12-18 MED ORDER — VALSARTAN 40 MG PO TABS
40.0000 mg | ORAL_TABLET | Freq: Every day | ORAL | 1 refills | Status: DC
Start: 2021-12-18 — End: 2022-06-16

## 2021-12-18 MED ORDER — ATORVASTATIN CALCIUM 10 MG PO TABS
10.0000 mg | ORAL_TABLET | Freq: Every day | ORAL | 1 refills | Status: DC
Start: 2021-12-18 — End: 2022-06-19

## 2021-12-18 NOTE — Assessment & Plan Note (Signed)
Chronic.  Controlled.  Running on the low end with HR in the 40s.  Will decrease Valsartan to '40mg'$  daily.  Labs ordered today.  Refills sent today.  Discussed symptoms of low blood pressure.  Running low in office today but higher at home.  States he isn't feeling any symptoms and hasn't had anything to eat or drink.  Return to clinic in 6 months for reevaluation.  Call sooner if concerns arise.

## 2021-12-18 NOTE — Assessment & Plan Note (Addendum)
Chronic.  Controlled.  Continue with current medication regimen on Atorvastatin daily.  Refills sent today.  Labs ordered today.  Return to clinic in 6 months for reevaluation.  Call sooner if concerns arise.

## 2021-12-18 NOTE — Assessment & Plan Note (Signed)
Labs ordered at visit today.  Will make recommendations based on lab results.   

## 2021-12-18 NOTE — Telephone Encounter (Signed)
Patient needs OV, will refill medication for 30 days until OV can be made.Patient needs OV for additional refills.  Requested Prescriptions  Pending Prescriptions Disp Refills   meloxicam (MOBIC) 15 MG tablet [Pharmacy Med Name: MELOXICAM 15 MG TABLET] 30 tablet 0    Sig: TAKE 1 TABLET (15 MG TOTAL) BY MOUTH DAILY.     Analgesics:  COX2 Inhibitors Failed - 12/18/2021 12:35 AM      Failed - Manual Review: Labs are only required if the patient has taken medication for more than 8 weeks.      Failed - HGB in normal range and within 360 days    Hemoglobin  Date Value Ref Range Status  08/21/2021 12.0 (L) 13.0 - 17.0 g/dL Final  06/19/2021 13.0 13.0 - 17.7 g/dL Final         Failed - Cr in normal range and within 360 days    Creatinine, Ser  Date Value Ref Range Status  08/21/2021 1.75 (H) 0.61 - 1.24 mg/dL Final         Passed - HCT in normal range and within 360 days    HCT  Date Value Ref Range Status  08/21/2021 40.2 39.0 - 52.0 % Final   Hematocrit  Date Value Ref Range Status  06/19/2021 40.2 37.5 - 51.0 % Final         Passed - AST in normal range and within 360 days    AST  Date Value Ref Range Status  06/19/2021 22 0 - 40 IU/L Final         Passed - ALT in normal range and within 360 days    ALT  Date Value Ref Range Status  06/19/2021 21 0 - 44 IU/L Final         Passed - eGFR is 30 or above and within 360 days    GFR calc Af Amer  Date Value Ref Range Status  12/14/2019 74 >59 mL/min/1.73 Final    Comment:    **In accordance with recommendations from the NKF-ASN Task force,**   [facility] is in the process of updating its eGFR calculation to the   2021 CKD-EPI creatinine equation that estimates kidney function   without a race variable.    GFR, Estimated  Date Value Ref Range Status  08/21/2021 39 (L) >60 mL/min Final    Comment:    (NOTE) Calculated using the CKD-EPI Creatinine Equation (2021)    eGFR  Date Value Ref Range Status  06/19/2021 67  >59 mL/min/1.73 Final         Passed - Patient is not pregnant      Passed - Valid encounter within last 12 months    Recent Outpatient Visits           6 months ago Essential hypertension   [facility] Jon Billings, NP   10 months ago Viral upper respiratory tract infection   [facility] Jon Billings, NP   1 year ago Annual physical exam   [facility] Jon Billings, NP   1 year ago Stage 3 chronic kidney disease, unspecified whether stage 3a or 3b CKD ([facility])   [facility] Jon Billings, NP   1 year ago Cough   [place], Barb Merino, DO       Future Appointments             Today Jon Billings, NP [facility] [place]

## 2021-12-18 NOTE — Assessment & Plan Note (Signed)
Chronic.  Controlled.  Continue with current medication regimen on Atorvastatin '10mg'$  daily.  Refill sent today.  Labs ordered today.  Return to clinic in 6 months for reevaluation.  Call sooner if concerns arise.

## 2021-12-18 NOTE — Progress Notes (Signed)
BP (!) 116/54   Pulse (!) 47   Temp 98.2 F (36.8 C) (Oral)   Ht 5' 11.2" (1.808 m)   Wt 215 lb 8 oz (97.8 kg)   SpO2 96%   BMI 29.89 kg/m    Subjective:    Patient ID: Matthew Randolph, male    DOB: 1940/05/30, 81 y.o.   MRN: [id number]  HPI: COUGAR IMEL is a 81 y.o. male presenting on 12/18/2021 for comprehensive medical examination. Current medical complaints include:none  He currently lives with: Interim Problems from his last visit: no  HYPERTENSION / HYPERLIPIDEMIA Satisfied with current treatment? yes Duration of hypertension: years BP monitoring frequency: rarely BP range: not sure BP medication side effects: no Past BP meds: valsartan Duration of hyperlipidemia: years Cholesterol medication side effects: no Cholesterol supplements: none Past cholesterol medications: atorvastain (lipitor) Medication compliance: excellent compliance Aspirin: no Recent stressors: no Recurrent headaches: no Visual changes: no Palpitations: no Dyspnea: no Chest pain: no Lower extremity edema: no Dizzy/lightheaded: no  CHRONIC KIDNEY DISEASE CKD status: controlled Medications renally dose: yes Previous renal evaluation: no Pneumovax:  Up to Date Influenza Vaccine:  Up to Date  ANEMIA Anemia status: controlled Etiology of anemia: CKD Duration of anemia treatment:  Compliance with treatment: excellent compliance Iron supplementation side effects: no Severity of anemia: mild Fatigue: no Decreased exercise tolerance: no  Dyspnea on exertion: no Palpitations: no Bleeding: no Pica: no Depression Screen done today and results listed below:     12/18/2021    9:28 AM 11/18/2021   11:45 AM 06/19/2021   10:08 AM 02/10/2021   10:13 AM 12/16/2020    8:04 AM  Depression screen PHQ 2/9  Decreased Interest 0 0 0 0 0  Down, Depressed, Hopeless 0 0 0 0 0  PHQ - 2 Score 0 0 0 0 0  Altered sleeping 0 0 0 0 0  Tired, decreased energy 0 0 0 0 0  Change in appetite 0 0 0 0 0   Feeling bad or failure about yourself  0 0 0 0 0  Trouble concentrating 0 0 0 0 0  Moving slowly or fidgety/restless 0 0 0 0 0  Suicidal thoughts 0  0 0 0  PHQ-9 Score 0 0 0 0 0  Difficult doing work/chores Not difficult at all Not difficult at all Not difficult at all Not difficult at all Not difficult at all    The patient does not have a history of falls. I did complete a risk assessment for falls. A plan of care for falls was documented.   Past Medical History:  Past Medical History:  Diagnosis Date   Arthritis    Benign tumor of pituitary gland ([place])    Chronic kidney disease    ED (erectile dysfunction)    Erectile dysfunction    GERD (gastroesophageal reflux disease)    High risk sexual behavior    Hyperlipidemia    Hypertension    Pneumonia 2012   Tobacco use disorder, continuous     Surgical History:  Past Surgical History:  Procedure Laterality Date   COLONOSCOPY  2015   LIPOMA EXCISION Left 10/04/2018   Procedure: EXCISION LIPOMA LEFT HIP;  Surgeon: Hessie Knows, MD;  Location: [facility] OR[facility];  Service: Orthopedics;  Laterality: Left;   PITUITARY SURGERY  12/10/2015   TOTAL HIP ARTHROPLASTY Right 09/09/2017   Procedure: TOTAL HIP ARTHROPLASTY ANTERIOR APPROACH;  Surgeon: Hessie Knows, MD;  Location: [facility];  Service: Orthopedics;  Laterality: Right;  TOTAL HIP ARTHROPLASTY Left 10/04/2018   Procedure: TOTAL HIP ARTHROPLASTY ANTERIOR APPROACH;  Surgeon: Hessie Knows, MD;  Location: [facility];  Service: Orthopedics;  Laterality: Left;    Medications:  Current Outpatient Medications on File Prior to Visit  Medication Sig   acetaminophen (TYLENOL) 500 MG tablet Take 500 mg by mouth every 6 (six) hours as needed.   albuterol (VENTOLIN HFA) 108 (90 Base) MCG/ACT inhaler Inhale 2 puffs into the lungs every 6 (six) hours as needed for wheezing or shortness of breath.   budesonide-formoterol (SYMBICORT) 80-4.5 MCG/ACT inhaler Take 2 puffs first thing in am and then another  2 puffs about 12 hours later.   famotidine (PEPCID) 20 MG tablet One after supper   fluticasone (FLONASE) 50 MCG/ACT nasal spray Place 2 sprays into both nostrils daily.   Menthol, Topical Analgesic, (ICY HOT EX) Apply 1 application topically daily as needed (pain).   Multiple Vitamin (MULTIVITAMIN) tablet Take 1 tablet by mouth daily.   No current facility-administered medications on file prior to visit.    Allergies:  No Known Allergies  Social History:  Social History   Socioeconomic History   Marital status: Divorced    Spouse name: Not on file   Number of children: 4   Years of education: 12th   Highest education level: High school graduate  Occupational History   Occupation: Retired Visual merchandiser  Tobacco Use   Smoking status: Every Day    Packs/day: 0.25    Years: 40.00    Total pack years: 10.00    Types: Cigarettes   Smokeless tobacco: Never   Tobacco comments:    2-3 cig daily 10/29/2020  Vaping Use   Vaping Use: Never used  Substance and Sexual Activity   Alcohol use: Not Currently   Drug use: No   Sexual activity: Yes    Partners: Female  Other Topics Concern   Not on file  Social History Narrative   ** Merged History Encounter **       Social Determinants of Health   Financial Resource Strain: Low Risk  (11/18/2021)   Overall Financial Resource Strain (CARDIA)    Difficulty of Paying Living Expenses: Not hard at all  Food Insecurity: No Food Insecurity (11/18/2021)   Hunger Vital Sign    Worried About Running Out of Food in the Last Year: Never true    [place] in the Last Year: Never true  Transportation Needs: No Transportation Needs (11/18/2021)   PRAPARE - Hydrologist (Medical): No    Lack of Transportation (Non-Medical): No  Physical Activity: Insufficiently Active (11/18/2021)   Exercise Vital Sign    Days of Exercise per Week: 3 days    Minutes of Exercise per Session: 20 min  Stress: No Stress  Concern Present (11/18/2021)   [place]    Feeling of Stress : Not at all  Social Connections: Moderately Isolated (11/18/2021)   Social Connection and Isolation Panel [NHANES]    Frequency of Communication with Friends and Family: Three times a week    Frequency of Social Gatherings with Friends and Family: Once a week    Attends Religious Services: More than 4 times per year    Active Member of [employer] or Organizations: No    Attends Archivist Meetings: Never    Marital Status: Divorced  Human resources officer Violence: Not At Risk (11/18/2021)   Humiliation, Afraid, Rape, and Kick  questionnaire    Fear of Current or Ex-Partner: No    Emotionally Abused: No    Physically Abused: No    Sexually Abused: No   Social History   Tobacco Use  Smoking Status Every Day   Packs/day: 0.25   Years: 40.00   Total pack years: 10.00   Types: Cigarettes  Smokeless Tobacco Never  Tobacco Comments   2-3 cig daily 10/29/2020   Social History   Substance and Sexual Activity  Alcohol Use Not Currently    Family History:  Family History  Problem Relation Age of Onset   Brain cancer Mother    Cancer Mother        Brain tumor   Thyroid disease Mother    Lung disease Mother        From [place]   Heart disease Father    Stroke Father    Hypertension Brother    Aneurysm Brother    Pneumonia Brother    Heart disease Sister        massive MI   Obesity Sister     Past medical history, surgical history, medications, allergies, family history and social history reviewed with patient today and changes made to appropriate areas of the chart.   Review of Systems  Eyes:  Negative for blurred vision and double vision.  Respiratory:  Negative for shortness of breath.   Cardiovascular:  Negative for chest pain, palpitations and leg swelling.  Neurological:  Negative for dizziness and headaches.   All other ROS negative  except what is listed above and in the HPI.      Objective:    BP (!) 116/54   Pulse (!) 47   Temp 98.2 F (36.8 C) (Oral)   Ht 5' 11.2" (1.808 m)   Wt 215 lb 8 oz (97.8 kg)   SpO2 96%   BMI 29.89 kg/m   Wt Readings from Last 3 Encounters:  12/18/21 215 lb 8 oz (97.8 kg)  08/21/21 180 lb (81.6 kg)  06/19/21 213 lb 9.6 oz (96.9 kg)    Physical Exam Vitals and nursing note reviewed.  Constitutional:      General: He is not in acute distress.    Appearance: Normal appearance. He is not ill-appearing, toxic-appearing or diaphoretic.  HENT:     Head: Normocephalic.     Right Ear: Tympanic membrane, ear canal and external ear normal.     Left Ear: Tympanic membrane, ear canal and external ear normal.     Nose: Nose normal. No congestion or rhinorrhea.     Mouth/Throat:     Mouth: Mucous membranes are moist.  Eyes:     General:        Right eye: No discharge.        Left eye: No discharge.     Extraocular Movements: Extraocular movements intact.     Conjunctiva/sclera: Conjunctivae normal.     Pupils: Pupils are equal, round, and reactive to light.  Cardiovascular:     Rate and Rhythm: Normal rate and regular rhythm.     Heart sounds: No murmur heard. Pulmonary:     Effort: Pulmonary effort is normal. No respiratory distress.     Breath sounds: Normal breath sounds. No wheezing, rhonchi or rales.  Abdominal:     General: Abdomen is flat. Bowel sounds are normal. There is no distension.     Palpations: Abdomen is soft.     Tenderness: There is no abdominal tenderness. There is no guarding.  Musculoskeletal:  Cervical back: Normal range of motion and neck supple.  Skin:    General: Skin is warm and dry.     Capillary Refill: Capillary refill takes less than 2 seconds.  Neurological:     General: No focal deficit present.     Mental Status: He is alert and oriented to person, place, and time.     Cranial Nerves: No cranial nerve deficit.     Motor: No weakness.      Deep Tendon Reflexes: Reflexes normal.  Psychiatric:        Mood and Affect: Mood normal.        Behavior: Behavior normal.        Thought Content: Thought content normal.        Judgment: Judgment normal.     Results for orders placed or performed during the hospital encounter of 00/17/49  Basic metabolic panel  Result Value Ref Range   Sodium 138 135 - 145 mmol/L   Potassium 4.6 3.5 - 5.1 mmol/L   Chloride 109 98 - 111 mmol/L   CO2 20 (L) 22 - 32 mmol/L   Glucose, Bld 103 (H) 70 - 99 mg/dL   BUN 18 8 - 23 mg/dL   Creatinine, Ser 1.75 (H) 0.61 - 1.24 mg/dL   Calcium 8.3 (L) 8.9 - 10.3 mg/dL   GFR, Estimated 39 (L) >60 mL/min   Anion gap 9 5 - 15  CBC  Result Value Ref Range   WBC 7.2 4.0 - 10.5 K/uL   RBC 4.38 4.22 - 5.81 MIL/uL   Hemoglobin 12.0 (L) 13.0 - 17.0 g/dL   HCT 40.2 39.0 - 52.0 %   MCV 91.8 80.0 - 100.0 fL   MCH 27.4 26.0 - 34.0 pg   MCHC 29.9 (L) 30.0 - 36.0 g/dL   RDW 13.4 11.5 - 15.5 %   Platelets 219 150 - 400 K/uL   nRBC 0.0 0.0 - 0.2 %  CK  Result Value Ref Range   Total CK 109 49 - 397 U/L  Troponin I (High Sensitivity)  Result Value Ref Range   Troponin I (High Sensitivity) 10 <18 ng/L      Assessment & Plan:   Problem List Items Addressed This Visit       Cardiovascular and Mediastinum   Essential hypertension    Chronic.  Controlled.  Running on the low end with HR in the 40s.  Will decrease Valsartan to '40mg'$  daily.  Labs ordered today.  Refills sent today.  Discussed symptoms of low blood pressure.  Running low in office today but higher at home.  States he isn't feeling any symptoms and hasn't had anything to eat or drink.  Return to clinic in 6 months for reevaluation.  Call sooner if concerns arise.        Relevant Medications   atorvastatin (LIPITOR) 10 MG tablet   valsartan (DIOVAN) 40 MG tablet   Aortic atherosclerosis (HCC)    Chronic.  Controlled.  Continue with current medication regimen on Atorvastatin daily.  Refills sent  today.  Labs ordered today.  Return to clinic in 6 months for reevaluation.  Call sooner if concerns arise.        Relevant Medications   atorvastatin (LIPITOR) 10 MG tablet   valsartan (DIOVAN) 40 MG tablet     Other   Hyperlipemia    Chronic.  Controlled.  Continue with current medication regimen on Atorvastatin '10mg'$  daily.  Refill sent today.  Labs ordered today.  Return  to clinic in 6 months for reevaluation.  Call sooner if concerns arise.        Relevant Medications   atorvastatin (LIPITOR) 10 MG tablet   valsartan (DIOVAN) 40 MG tablet   Other Relevant Orders   Lipid panel   Anemia    Labs ordered at visit today.  Will make recommendations based on lab results.         Other Visit Diagnoses     Annual physical exam    -  Primary   Health maintenance reviewed during visit today.  Labs ordered.  Vaccine sup to date.  Requested STI screening.   Relevant Orders   HgB A1c   TSH   PSA   Lipid panel   CBC with Differential/Platelet   Comprehensive metabolic panel   Urinalysis, Routine w reflex microscopic   Elevated glucose       Relevant Orders   HgB A1c   Routine screening for STI (sexually transmitted infection)       Relevant Orders   HIV Antibody (routine testing w rflx)   RPR   Hepatitis C antibody   Chlamydia/Gonococcus/Trichomonas, NAA   HSV 1 and 2 Ab, IgG        Discussed aspirin prophylaxis for myocardial infarction prevention and decision was it was not indicated  LABORATORY TESTING:  Health maintenance labs ordered today as discussed above.   The natural history of prostate cancer and ongoing controversy regarding screening and potential treatment outcomes of prostate cancer has been discussed with the patient. The meaning of a false positive PSA and a false negative PSA has been discussed. He indicates understanding of the limitations of this screening test and wishes to proceed with screening PSA testing.   IMMUNIZATIONS:   - Tdap: Tetanus  vaccination status reviewed: last tetanus booster within 10 years. - Influenza: Up to date - Pneumovax: Up to date - Prevnar: Up to date - COVID: Up to date - HPV: Not applicable - Shingrix vaccine:  Discussed at visit today  SCREENING: - Colonoscopy: Not applicable  Discussed with patient purpose of the colonoscopy is to detect colon cancer at curable precancerous or early stages   - AAA Screening: Not applicable  -Hearing Test: Not applicable  -Spirometry: Not applicable   PATIENT COUNSELING:    Sexuality: Discussed sexually transmitted diseases, partner selection, use of condoms, avoidance of unintended pregnancy  and contraceptive alternatives.   Advised to avoid cigarette smoking.  I discussed with the patient that most people either abstain from alcohol or drink within safe limits (<=14/week and <=4 drinks/occasion for males, <=7/weeks and <= 3 drinks/occasion for females) and that the risk for alcohol disorders and other health effects rises proportionally with the number of drinks per week and how often a drinker exceeds daily limits.  Discussed cessation/primary prevention of drug use and availability of treatment for abuse.   Diet: Encouraged to adjust caloric intake to maintain  or achieve ideal body weight, to reduce intake of dietary saturated fat and total fat, to limit sodium intake by avoiding high sodium foods and not adding table salt, and to maintain adequate dietary potassium and calcium preferably from fresh fruits, vegetables, and low-fat dairy products.    stressed the importance of regular exercise  Injury prevention: Discussed safety belts, safety helmets, smoke detector, smoking near bedding or upholstery.   Dental health: Discussed importance of regular tooth brushing, flossing, and dental visits.   Follow up plan: NEXT PREVENTATIVE PHYSICAL DUE IN 1 YEAR. Return in  about 6 months (around 06/19/2022) for HTN, HLD, DM2 FU.

## 2021-12-19 ENCOUNTER — Encounter: Payer: Self-pay | Admitting: Nurse Practitioner

## 2021-12-19 LAB — CBC WITH DIFFERENTIAL/PLATELET
Basophils Absolute: 0.1 10*3/uL (ref 0.0–0.2)
Basos: 1 %
EOS (ABSOLUTE): 0.1 10*3/uL (ref 0.0–0.4)
Eos: 2 %
Hematocrit: 40.4 % (ref 37.5–51.0)
Hemoglobin: 13 g/dL (ref 13.0–17.7)
Immature Grans (Abs): 0 10*3/uL (ref 0.0–0.1)
Immature Granulocytes: 0 %
Lymphocytes Absolute: 2.8 10*3/uL (ref 0.7–3.1)
Lymphs: 49 %
MCH: 28.4 pg (ref 26.6–33.0)
MCHC: 32.2 g/dL (ref 31.5–35.7)
MCV: 88 fL (ref 79–97)
Monocytes Absolute: 0.5 10*3/uL (ref 0.1–0.9)
Monocytes: 8 %
Neutrophils Absolute: 2.2 10*3/uL (ref 1.4–7.0)
Neutrophils: 40 %
Platelets: 151 10*3/uL (ref 150–450)
RBC: 4.58 x10E6/uL (ref 4.14–5.80)
RDW: 12.4 % (ref 11.6–15.4)
WBC: 5.6 10*3/uL (ref 3.4–10.8)

## 2021-12-19 LAB — COMPREHENSIVE METABOLIC PANEL
ALT: 24 IU/L (ref 0–44)
AST: 19 IU/L (ref 0–40)
Albumin/Globulin Ratio: 1.6 (ref 1.2–2.2)
Albumin: 4.1 g/dL (ref 3.8–4.8)
Alkaline Phosphatase: 65 IU/L (ref 44–121)
BUN/Creatinine Ratio: 7 — ABNORMAL LOW (ref 10–24)
BUN: 8 mg/dL (ref 8–27)
Bilirubin Total: 0.4 mg/dL (ref 0.0–1.2)
CO2: 21 mmol/L (ref 20–29)
Calcium: 9.3 mg/dL (ref 8.6–10.2)
Chloride: 106 mmol/L (ref 96–106)
Creatinine, Ser: 1.11 mg/dL (ref 0.76–1.27)
Globulin, Total: 2.6 g/dL (ref 1.5–4.5)
Glucose: 99 mg/dL (ref 70–99)
Potassium: 4.4 mmol/L (ref 3.5–5.2)
Sodium: 142 mmol/L (ref 134–144)
Total Protein: 6.7 g/dL (ref 6.0–8.5)
eGFR: 68 mL/min/{1.73_m2} (ref >59)

## 2021-12-19 LAB — HEMOGLOBIN A1C
Est. average glucose Bld gHb Est-mCnc: 126 mg/dL
Hgb A1c MFr Bld: 6 % — ABNORMAL HIGH (ref 4.8–5.6)

## 2021-12-19 LAB — LIPID PANEL
Chol/HDL Ratio: 2.4 ratio (ref 0.0–5.0)
Cholesterol, Total: 160 mg/dL (ref 100–199)
HDL: 67 mg/dL (ref >39)
LDL Chol Calc (NIH): 79 mg/dL (ref 0–99)
Triglycerides: 76 mg/dL (ref 0–149)
VLDL Cholesterol Cal: 14 mg/dL (ref 5–40)

## 2021-12-19 LAB — HIV ANTIBODY (ROUTINE TESTING W REFLEX): HIV Screen 4th Generation wRfx: NONREACTIVE

## 2021-12-19 LAB — HSV 1 AND 2 AB, IGG
HSV 1 Glycoprotein G Ab, IgG: 33.5 index — ABNORMAL HIGH (ref 0.00–0.90)
HSV 2 IgG, Type Spec: 2.71 index — ABNORMAL HIGH (ref 0.00–0.90)

## 2021-12-19 LAB — HEPATITIS C ANTIBODY: Hep C Virus Ab: NONREACTIVE

## 2021-12-19 LAB — RPR: RPR Ser Ql: NONREACTIVE

## 2021-12-19 LAB — PSA: Prostate Specific Ag, Serum: 2.2 ng/mL (ref 0.0–4.0)

## 2021-12-19 LAB — TSH: TSH: 1.35 u[IU]/mL (ref 0.450–4.500)

## 2021-12-22 DIAGNOSIS — H2513 Age-related nuclear cataract, bilateral: Secondary | ICD-10-CM | POA: Diagnosis not present

## 2021-12-23 LAB — CHLAMYDIA/GONOCOCCUS/TRICHOMONAS, NAA
Chlamydia by NAA: NEGATIVE
Gonococcus by NAA: NEGATIVE
Trich vag by NAA: NEGATIVE

## 2021-12-30 ENCOUNTER — Encounter: Payer: Self-pay | Admitting: Nurse Practitioner

## 2022-01-05 HISTORY — PX: DENTAL SURGERY: SHX609

## 2022-02-25 ENCOUNTER — Encounter: Payer: Self-pay | Admitting: Nurse Practitioner

## 2022-06-16 ENCOUNTER — Encounter: Payer: Self-pay | Admitting: Nurse Practitioner

## 2022-06-16 MED ORDER — FLUTICASONE PROPIONATE 50 MCG/ACT NA SUSP: 2.0000 | Freq: Every day | NASAL | 3 refills | Status: DC

## 2022-06-16 MED ORDER — VALSARTAN 40 MG PO TABS
40.0000 mg | ORAL_TABLET | Freq: Every day | ORAL | 0 refills | Status: DC
Start: 2022-06-16 — End: 2022-06-19

## 2022-06-19 ENCOUNTER — Ambulatory Visit (INDEPENDENT_AMBULATORY_CARE_PROVIDER_SITE_OTHER): Payer: Medicare HMO | Admitting: Nurse Practitioner

## 2022-06-19 ENCOUNTER — Encounter: Payer: Self-pay | Admitting: Nurse Practitioner

## 2022-06-19 VITALS — BP 114/57 | HR 37 | Temp 97.7°F | Wt 206.2 lb

## 2022-06-19 DIAGNOSIS — R001 Bradycardia, unspecified: Secondary | ICD-10-CM

## 2022-06-19 DIAGNOSIS — I700 Atherosclerosis of aorta: Secondary | ICD-10-CM | POA: Diagnosis not present

## 2022-06-19 DIAGNOSIS — E782 Mixed hyperlipidemia: Secondary | ICD-10-CM

## 2022-06-19 DIAGNOSIS — I10 Essential (primary) hypertension: Secondary | ICD-10-CM

## 2022-06-19 DIAGNOSIS — D352 Benign neoplasm of pituitary gland: Secondary | ICD-10-CM | POA: Diagnosis not present

## 2022-06-19 DIAGNOSIS — D649 Anemia, unspecified: Secondary | ICD-10-CM

## 2022-06-19 DIAGNOSIS — N1831 Chronic kidney disease, stage 3a: Secondary | ICD-10-CM

## 2022-06-19 MED ORDER — VALSARTAN 40 MG PO TABS: 40.0000 mg | ORAL_TABLET | Freq: Every day | ORAL | 1 refills | Status: DC

## 2022-06-19 MED ORDER — MELOXICAM 15 MG PO TABS: 15.0000 mg | ORAL_TABLET | Freq: Every day | ORAL | 1 refills | Status: DC

## 2022-06-19 MED ORDER — FLUTICASONE PROPIONATE 50 MCG/ACT NA SUSP: 2.0000 | Freq: Every day | NASAL | 3 refills | Status: DC

## 2022-06-19 MED ORDER — BUDESONIDE-FORMOTEROL FUMARATE 80-4.5 MCG/ACT IN AERO: INHALATION_SPRAY | RESPIRATORY_TRACT | 12 refills | Status: DC

## 2022-06-19 MED ORDER — ALBUTEROL SULFATE HFA 108 (90 BASE) MCG/ACT IN AERS: 2.0000 | INHALATION_SPRAY | Freq: Four times a day (QID) | RESPIRATORY_TRACT | 3 refills | Status: AC | PRN

## 2022-06-19 MED ORDER — ATORVASTATIN CALCIUM 10 MG PO TABS: 10.0000 mg | ORAL_TABLET | Freq: Every day | ORAL | 1 refills | Status: DC

## 2022-06-19 NOTE — Progress Notes (Signed)
Results discussed with patient during visit.

## 2022-06-19 NOTE — Assessment & Plan Note (Signed)
Under good control on current regimen. Continue current regimen. Continue to monitor. Call with any concerns. Labs drawn today.  

## 2022-06-19 NOTE — Assessment & Plan Note (Signed)
Chronic.  Controlled.  Continue with current medication regimen on Atorvastatin daily.  Refills sent today.  Labs ordered today.  Return to clinic in 6 months for reevaluation.  Call sooner if concerns arise.   

## 2022-06-19 NOTE — Assessment & Plan Note (Signed)
Chronic.  Controlled.  Continue with current medication regimen.  Can consider adding SGLT2.  Labs ordered today.  Return to clinic in 6 months for reevaluation.  Call sooner if concerns arise.

## 2022-06-19 NOTE — Assessment & Plan Note (Signed)
EKG done in office showed sinus brady with 1st degree heart block.  Urgent referral placed for Cardiology.  Has not seen Cardiology since 2020.  Patient is fasting in office today.  Follow up in 1 week for HR check after eating and drinking in the morning.

## 2022-06-19 NOTE — Progress Notes (Signed)
BP (!) 114/57   Pulse (!) 37   Temp 97.7 F (36.5 C) (Oral)   Wt 206 lb 3.2 oz (93.5 kg)   SpO2 95%   BMI 28.60 kg/m    Subjective:    Patient ID: Matthew Randolph, male    DOB: 1940/04/12, 82 y.o.   MRN: [id number]  HPI: Matthew Randolph is a 82 y.o. male  Chief Complaint  Patient presents with   Hypertension   Hyperlipidemia   HYPERTENSION / HYPERLIPIDEMIA Satisfied with current treatment? yes Duration of hypertension: years BP monitoring frequency: rarely BP range: not sure BP medication side effects: no Past BP meds: valsartan Duration of hyperlipidemia: years Cholesterol medication side effects: no Cholesterol supplements: none Past cholesterol medications: atorvastain (lipitor) Medication compliance: excellent compliance Aspirin: no Recent stressors: no Recurrent headaches: no Visual changes: no Palpitations: no Dyspnea: no Chest pain: no Lower extremity edema: no Dizzy/lightheaded: no  CHRONIC KIDNEY DISEASE CKD status: controlled Medications renally dose: yes Previous renal evaluation: no Pneumovax:  Up to Date Influenza Vaccine:  Up to Date  ANEMIA Anemia status: controlled Etiology of anemia: CKD Duration of anemia treatment:  Compliance with treatment: excellent compliance Iron supplementation side effects: no Severity of anemia: mild Fatigue: no Decreased exercise tolerance: no  Dyspnea on exertion: no Palpitations: no Bleeding: no Pica: no     Relevant past medical, surgical, family and social history reviewed and updated as indicated. Interim medical history since our last visit reviewed. Allergies and medications reviewed and updated.  Review of Systems  Constitutional:  Negative for fatigue.  Eyes:  Negative for visual disturbance.  Respiratory:  Negative for chest tightness and shortness of breath.   Cardiovascular:  Negative for chest pain, palpitations and leg swelling.  Gastrointestinal:  Negative for blood in stool and  rectal pain.  Genitourinary:  Negative for hematuria.  Neurological:  Negative for dizziness, light-headedness and headaches.    Per HPI unless specifically indicated above     Objective:    BP (!) 114/57   Pulse (!) 37   Temp 97.7 F (36.5 C) (Oral)   Wt 206 lb 3.2 oz (93.5 kg)   SpO2 95%   BMI 28.60 kg/m   Wt Readings from Last 3 Encounters:  06/19/22 206 lb 3.2 oz (93.5 kg)  12/18/21 215 lb 8 oz (97.8 kg)  08/21/21 180 lb (81.6 kg)    Physical Exam Vitals and nursing note reviewed.  Constitutional:      General: He is not in acute distress.    Appearance: Normal appearance. He is not ill-appearing, toxic-appearing or diaphoretic.  HENT:     Head: Normocephalic.     Right Ear: External ear normal.     Left Ear: External ear normal.     Nose: Nose normal. No congestion or rhinorrhea.     Mouth/Throat:     Mouth: Mucous membranes are moist.  Eyes:     General:        Right eye: No discharge.        Left eye: No discharge.     Extraocular Movements: Extraocular movements intact.     Conjunctiva/sclera: Conjunctivae normal.     Pupils: Pupils are equal, round, and reactive to light.  Cardiovascular:     Rate and Rhythm: Regular rhythm. Bradycardia present.     Heart sounds: No murmur heard. Pulmonary:     Effort: Pulmonary effort is normal. No respiratory distress.     Breath sounds: Normal breath sounds. No wheezing,  rhonchi or rales.  Abdominal:     General: Abdomen is flat. Bowel sounds are normal.  Musculoskeletal:     Cervical back: Normal range of motion and neck supple.  Skin:    General: Skin is warm and dry.     Capillary Refill: Capillary refill takes less than 2 seconds.  Neurological:     General: No focal deficit present.     Mental Status: He is alert and oriented to person, place, and time.  Psychiatric:        Mood and Affect: Mood normal.        Behavior: Behavior normal.        Thought Content: Thought content normal.        Judgment:  Judgment normal.     Results for orders placed or performed in visit on 12/18/21  Chlamydia/Gonococcus/Trichomonas, NAA   Specimen: Urine   UR  Result Value Ref Range   Chlamydia by NAA Negative Negative   Gonococcus by NAA Negative Negative   Trich vag by NAA Negative Negative  HgB A1c  Result Value Ref Range   Hgb A1c MFr Bld 6.0 (H) 4.8 - 5.6 %   Est. average glucose Bld gHb Est-mCnc 126 mg/dL  TSH  Result Value Ref Range   TSH 1.350 0.450 - 4.500 uIU/mL  PSA  Result Value Ref Range   Prostate Specific Ag, Serum 2.2 0.0 - 4.0 ng/mL  Lipid panel  Result Value Ref Range   Cholesterol, Total 160 100 - 199 mg/dL   Triglycerides 76 0 - 149 mg/dL   HDL 67 >16 mg/dL   VLDL Cholesterol Cal 14 5 - 40 mg/dL   LDL Chol Calc (NIH) 79 0 - 99 mg/dL   Chol/HDL Ratio 2.4 0.0 - 5.0 ratio  CBC with Differential/Platelet  Result Value Ref Range   WBC 5.6 3.4 - 10.8 x10E3/uL   RBC 4.58 4.14 - 5.80 x10E6/uL   Hemoglobin 13.0 13.0 - 17.7 g/dL   Hematocrit 10.9 60.4 - 51.0 %   MCV 88 79 - 97 fL   MCH 28.4 26.6 - 33.0 pg   MCHC 32.2 31.5 - 35.7 g/dL   RDW 54.0 98.1 - 19.1 %   Platelets 151 150 - 450 x10E3/uL   Neutrophils 40 Not Estab. %   Lymphs 49 Not Estab. %   Monocytes 8 Not Estab. %   Eos 2 Not Estab. %   Basos 1 Not Estab. %   Neutrophils Absolute 2.2 1.4 - 7.0 x10E3/uL   Lymphocytes Absolute 2.8 0.7 - 3.1 x10E3/uL   Monocytes Absolute 0.5 0.1 - 0.9 x10E3/uL   EOS (ABSOLUTE) 0.1 0.0 - 0.4 x10E3/uL   Basophils Absolute 0.1 0.0 - 0.2 x10E3/uL   Immature Granulocytes 0 Not Estab. %   Immature Grans (Abs) 0.0 0.0 - 0.1 x10E3/uL  Comprehensive metabolic panel  Result Value Ref Range   Glucose 99 70 - 99 mg/dL   BUN 8 8 - 27 mg/dL   Creatinine, Ser 4.78 0.76 - 1.27 mg/dL   eGFR 68 >29 FA/OZH/0.86   BUN/Creatinine Ratio 7 (L) 10 - 24   Sodium 142 134 - 144 mmol/L   Potassium 4.4 3.5 - 5.2 mmol/L   Chloride 106 96 - 106 mmol/L   CO2 21 20 - 29 mmol/L   Calcium 9.3 8.6 - 10.2  mg/dL   Total Protein 6.7 6.0 - 8.5 g/dL   Albumin 4.1 3.8 - 4.8 g/dL   Globulin, Total 2.6 1.5 - 4.5 g/dL  Albumin/Globulin Ratio 1.6 1.2 - 2.2   Bilirubin Total 0.4 0.0 - 1.2 mg/dL   Alkaline Phosphatase 65 44 - 121 IU/L   AST 19 0 - 40 IU/L   ALT 24 0 - 44 IU/L  Urinalysis, Routine w reflex microscopic  Result Value Ref Range   Specific Gravity, UA 1.020 1.005 - 1.030   pH, UA 5.0 5.0 - 7.5   Color, UA Yellow Yellow   Appearance Ur Clear Clear   Leukocytes,UA Negative Negative   Protein,UA Negative Negative/Trace   Glucose, UA Negative Negative   Ketones, UA Negative Negative   RBC, UA Negative Negative   Bilirubin, UA Negative Negative   Urobilinogen, Ur 0.2 0.2 - 1.0 mg/dL   Nitrite, UA Negative Negative   Microscopic Examination Comment   HIV Antibody (routine testing w rflx)  Result Value Ref Range   HIV Screen 4th Generation wRfx Non Reactive Non Reactive  RPR  Result Value Ref Range   RPR Ser Ql Non Reactive Non Reactive  Hepatitis C antibody  Result Value Ref Range   Hep C Virus Ab Non Reactive Non Reactive  HSV 1 and 2 Ab, IgG  Result Value Ref Range   HSV 1 Glycoprotein G Ab, IgG 33.50 (H) 0.00 - 0.90 index   HSV 2 IgG, Type Spec 2.71 (H) 0.00 - 0.90 index      Assessment & Plan:   Problem List Items Addressed This Visit       Cardiovascular and Mediastinum   Essential hypertension    Chronic.  Controlled.  Running on the low end with HR in the 30-40s.  Patient is fasting this morning.  EKG done in the office. Continue Valsartan to 40mg  daily.  Labs ordered today.  Refills sent today.  Discussed symptoms of low blood pressure.  Running low in office today but higher at home.  States he isn't feeling any symptoms and hasn't had anything to eat or drink.  Return to clinic in 6 months for reevaluation.  Call sooner if concerns arise.        Relevant Medications   atorvastatin (LIPITOR) 10 MG tablet   valsartan (DIOVAN) 40 MG tablet   Other Relevant  Orders   Comp Met (CMET)   Aortic atherosclerosis (HCC) - Primary    Chronic.  Controlled.  Continue with current medication regimen on Atorvastatin daily.  Refills sent today.  Labs ordered today.  Return to clinic in 6 months for reevaluation.  Call sooner if concerns arise.        Relevant Medications   atorvastatin (LIPITOR) 10 MG tablet   valsartan (DIOVAN) 40 MG tablet     Endocrine   Pituitary adenoma (HCC)    Under good control on current regimen. Continue current regimen. Continue to monitor. Call with any concerns. Labs drawn today.          Genitourinary   CKD (chronic kidney disease)    Chronic.  Controlled.  Continue with current medication regimen.  Can consider adding SGLT2.  Labs ordered today.  Return to clinic in 6 months for reevaluation.  Call sooner if concerns arise.          Other   Hyperlipemia    Chronic.  Controlled.  Continue with current medication regimen on Atorvastatin 10mg  daily.  Refill sent today.  Labs ordered today.  Return to clinic in 6 months for reevaluation.  Call sooner if concerns arise.        Relevant Medications   atorvastatin (  LIPITOR) 10 MG tablet   valsartan (DIOVAN) 40 MG tablet   Other Relevant Orders   Lipid Profile   Anemia    Labs ordered at visit today.  Will make recommendations based on lab results.         Relevant Orders   CBC w/Diff   Bradycardia    EKG done in office showed sinus brady with 1st degree heart block.  Urgent referral placed for Cardiology.  Has not seen Cardiology since 2020.  Patient is fasting in office today.  Follow up in 1 week for HR check after eating and drinking in the morning.        Relevant Orders   EKG 12-Lead (Completed)   Ambulatory referral to Cardiology     Follow up plan: Return in about 6 months (around 12/19/2022) for Physical and Fasting labs.

## 2022-06-19 NOTE — Assessment & Plan Note (Signed)
Chronic.  Controlled.  Running on the low end with HR in the 30-40s.  Patient is fasting this morning.  EKG done in the office. Continue Valsartan to 40mg  daily.  Labs ordered today.  Refills sent today.  Discussed symptoms of low blood pressure.  Running low in office today but higher at home.  States he isn't feeling any symptoms and hasn't had anything to eat or drink.  Return to clinic in 6 months for reevaluation.  Call sooner if concerns arise.

## 2022-06-19 NOTE — Assessment & Plan Note (Signed)
Chronic.  Controlled.  Continue with current medication regimen on Atorvastatin 10mg daily.  Refill sent today.  Labs ordered today.  Return to clinic in 6 months for reevaluation.  Call sooner if concerns arise.   

## 2022-06-19 NOTE — Assessment & Plan Note (Signed)
Labs ordered at visit today.  Will make recommendations based on lab results.   

## 2022-06-20 LAB — COMPREHENSIVE METABOLIC PANEL
ALT: 21 IU/L (ref 0–44)
AST: 22 IU/L (ref 0–40)
Albumin: 4.4 g/dL (ref 3.8–4.8)
Alkaline Phosphatase: 54 IU/L (ref 44–121)
BUN/Creatinine Ratio: 12 (ref 10–24)
BUN: 12 mg/dL (ref 8–27)
Bilirubin Total: 0.7 mg/dL (ref 0.0–1.2)
CO2: 22 mmol/L (ref 20–29)
Calcium: 9.7 mg/dL (ref 8.6–10.2)
Chloride: 105 mmol/L (ref 96–106)
Creatinine, Ser: 1.03 mg/dL (ref 0.76–1.27)
Globulin, Total: 2.6 g/dL (ref 1.5–4.5)
Glucose: 97 mg/dL (ref 70–99)
Potassium: 4.4 mmol/L (ref 3.5–5.2)
Sodium: 142 mmol/L (ref 134–144)
Total Protein: 7 g/dL (ref 6.0–8.5)
eGFR: 74 mL/min/{1.73_m2} (ref >59)

## 2022-06-20 LAB — CBC WITH DIFFERENTIAL/PLATELET
Basophils Absolute: 0.1 10*3/uL (ref 0.0–0.2)
Basos: 1 %
EOS (ABSOLUTE): 0.1 10*3/uL (ref 0.0–0.4)
Eos: 1 %
Hematocrit: 42.3 % (ref 37.5–51.0)
Hemoglobin: 13.1 g/dL (ref 13.0–17.7)
Immature Grans (Abs): 0 10*3/uL (ref 0.0–0.1)
Immature Granulocytes: 0 %
Lymphocytes Absolute: 2.5 10*3/uL (ref 0.7–3.1)
Lymphs: 52 %
MCH: 28 pg (ref 26.6–33.0)
MCHC: 31 g/dL — ABNORMAL LOW (ref 31.5–35.7)
MCV: 90 fL (ref 79–97)
Monocytes Absolute: 0.4 10*3/uL (ref 0.1–0.9)
Monocytes: 8 %
Neutrophils Absolute: 1.9 10*3/uL (ref 1.4–7.0)
Neutrophils: 38 %
Platelets: 200 10*3/uL (ref 150–450)
RBC: 4.68 x10E6/uL (ref 4.14–5.80)
RDW: 12.8 % (ref 11.6–15.4)
WBC: 4.9 10*3/uL (ref 3.4–10.8)

## 2022-06-20 LAB — LIPID PANEL
Chol/HDL Ratio: 2.5 ratio (ref 0.0–5.0)
Cholesterol, Total: 160 mg/dL (ref 100–199)
HDL: 65 mg/dL (ref >39)
LDL Chol Calc (NIH): 80 mg/dL (ref 0–99)
Triglycerides: 82 mg/dL (ref 0–149)
VLDL Cholesterol Cal: 15 mg/dL (ref 5–40)

## 2022-06-22 NOTE — Progress Notes (Signed)
Hi Taishawn. It was nice to see you last week.  Your lab work looks good.  No concerns at this time. Continue with your current medication regimen.  Follow up as discussed.  Please let me know if you have any questions.

## 2022-06-23 ENCOUNTER — Ambulatory Visit (INDEPENDENT_AMBULATORY_CARE_PROVIDER_SITE_OTHER): Payer: Medicare HMO | Admitting: Family Medicine

## 2022-06-23 ENCOUNTER — Encounter: Payer: Self-pay | Admitting: Family Medicine

## 2022-06-23 VITALS — BP 125/67 | HR 47 | Temp 97.7°F | Wt 211.6 lb

## 2022-06-23 DIAGNOSIS — R001 Bradycardia, unspecified: Secondary | ICD-10-CM

## 2022-06-23 NOTE — Patient Instructions (Signed)
If anything changes follow up with [place] sooner

## 2022-06-23 NOTE — Progress Notes (Signed)
BP 125/67   Pulse (!) 47 Comment: Manual  Temp 97.7 F (36.5 C) (Oral)   Wt 211 lb 9.6 oz (96 kg)   SpO2 98%   BMI 29.35 kg/m    Subjective:    Patient ID: Matthew Randolph, male    DOB: Feb 19, 1940, 82 y.o.   MRN: [id number]  HPI: Matthew Randolph is a 82 y.o. male  Chief Complaint  Patient presents with   Bradycardia    4 day follow up on heart rate   Pt HR 37 at last visit, with first degree heart block on EKG. He is here for 4 day follow up today. Encouraged to increase his water intake at previous visit. He has increased his water intake to x13, 16 oz cups of water daily over the last 4 days. He denies cp, sob, light headedness, dizziness, syncope, orthopnea, fatigue, or weakness. Cardiology reached out during appt today, scheduled appt for August 2024, will place him on wait list for sooner if availability arises.  He previously saw Cardiology in 2020 for surgical clearance d/t bradycardia, HR in the 40s.    Relevant past medical, surgical, family and social history reviewed and updated as indicated. Interim medical history since our last visit reviewed. Allergies and medications reviewed and updated.  Review of Systems  Respiratory: Negative.    Cardiovascular:  Negative for chest pain, palpitations and leg swelling.  Skin:  Negative for pallor.  Neurological:  Negative for dizziness, syncope, weakness and light-headedness.    Per HPI unless specifically indicated above     Objective:    BP 125/67   Pulse (!) 47 Comment: Manual  Temp 97.7 F (36.5 C) (Oral)   Wt 211 lb 9.6 oz (96 kg)   SpO2 98%   BMI 29.35 kg/m   Wt Readings from Last 3 Encounters:  06/23/22 211 lb 9.6 oz (96 kg)  06/19/22 206 lb 3.2 oz (93.5 kg)  12/18/21 215 lb 8 oz (97.8 kg)    Physical Exam Vitals and nursing note reviewed.  Constitutional:      General: He is awake. He is not in acute distress.    Appearance: Normal appearance. He is well-developed and well-groomed. He is obese. He  is not ill-appearing.  HENT:     Head: Normocephalic and atraumatic.     Right Ear: Hearing and external ear normal. No drainage.     Left Ear: Hearing and external ear normal. No drainage.     Nose: Nose normal.  Eyes:     General: Lids are normal.        Right eye: No discharge.        Left eye: No discharge.     Conjunctiva/sclera: Conjunctivae normal.  Cardiovascular:     Rate and Rhythm: Regular rhythm. Bradycardia present.     Pulses:          Radial pulses are 2+ on the right side and 2+ on the left side.       Dorsalis pedis pulses are 2+ on the right side and 2+ on the left side.       Posterior tibial pulses are 2+ on the right side and 2+ on the left side.     Heart sounds: Normal heart sounds, S1 normal and S2 normal. No murmur heard.    No gallop.  Pulmonary:     Effort: Pulmonary effort is normal. No accessory muscle usage or respiratory distress.     Breath sounds: Normal breath sounds.  Musculoskeletal:        General: Normal range of motion.     Cervical back: Full passive range of motion without pain and normal range of motion.     Right lower leg: No edema.     Left lower leg: No edema.  Skin:    General: Skin is warm and dry.     Capillary Refill: Capillary refill takes less than 2 seconds.  Neurological:     Mental Status: He is alert and oriented to person, place, and time.  Psychiatric:        Attention and Perception: Attention normal.        Mood and Affect: Mood normal.        Speech: Speech normal.        Behavior: Behavior normal. Behavior is cooperative.        Thought Content: Thought content normal.     Results for orders placed or performed in visit on 06/19/22  Comp Met (CMET)  Result Value Ref Range   Glucose 97 70 - 99 mg/dL   BUN 12 8 - 27 mg/dL   Creatinine, Ser 1.61 0.76 - 1.27 mg/dL   eGFR 74 >09 UE/AVW/0.98   BUN/Creatinine Ratio 12 10 - 24   Sodium 142 134 - 144 mmol/L   Potassium 4.4 3.5 - 5.2 mmol/L   Chloride 105 96 - 106  mmol/L   CO2 22 20 - 29 mmol/L   Calcium 9.7 8.6 - 10.2 mg/dL   Total Protein 7.0 6.0 - 8.5 g/dL   Albumin 4.4 3.8 - 4.8 g/dL   Globulin, Total 2.6 1.5 - 4.5 g/dL   Bilirubin Total 0.7 0.0 - 1.2 mg/dL   Alkaline Phosphatase 54 44 - 121 IU/L   AST 22 0 - 40 IU/L   ALT 21 0 - 44 IU/L  Lipid Profile  Result Value Ref Range   Cholesterol, Total 160 100 - 199 mg/dL   Triglycerides 82 0 - 149 mg/dL   HDL 65 >11 mg/dL   VLDL Cholesterol Cal 15 5 - 40 mg/dL   LDL Chol Calc (NIH) 80 0 - 99 mg/dL   Chol/HDL Ratio 2.5 0.0 - 5.0 ratio  CBC w/Diff  Result Value Ref Range   WBC 4.9 3.4 - 10.8 x10E3/uL   RBC 4.68 4.14 - 5.80 x10E6/uL   Hemoglobin 13.1 13.0 - 17.7 g/dL   Hematocrit 91.4 78.2 - 51.0 %   MCV 90 79 - 97 fL   MCH 28.0 26.6 - 33.0 pg   MCHC 31.0 (L) 31.5 - 35.7 g/dL   RDW 95.6 21.3 - 08.6 %   Platelets 200 150 - 450 x10E3/uL   Neutrophils 38 Not Estab. %   Lymphs 52 Not Estab. %   Monocytes 8 Not Estab. %   Eos 1 Not Estab. %   Basos 1 Not Estab. %   Neutrophils Absolute 1.9 1.4 - 7.0 x10E3/uL   Lymphocytes Absolute 2.5 0.7 - 3.1 x10E3/uL   Monocytes Absolute 0.4 0.1 - 0.9 x10E3/uL   EOS (ABSOLUTE) 0.1 0.0 - 0.4 x10E3/uL   Basophils Absolute 0.1 0.0 - 0.2 x10E3/uL   Immature Granulocytes 0 Not Estab. %   Immature Grans (Abs) 0.0 0.0 - 0.1 x10E3/uL      Assessment & Plan:   Problem List Items Addressed This Visit     Bradycardia - Primary    Acute, ongoing. Cardiology appt scheduled for August 2024, recommend water intake of at least 100 oz daily, call office  if symptoms arise. Provided educational materials on bradycardia, healthy dieting, and exercise.         Follow up plan: Return if symptoms worsen or fail to improve.

## 2022-06-23 NOTE — Assessment & Plan Note (Addendum)
Acute, ongoing. Cardiology appt scheduled for August 2024, recommend water intake of at least 100 oz daily, call office if symptoms arise. Provided educational materials on bradycardia, healthy dieting, and exercise.

## 2022-08-12 ENCOUNTER — Encounter: Payer: Self-pay | Admitting: Nurse Practitioner

## 2022-08-12 ENCOUNTER — Other Ambulatory Visit: Payer: Self-pay

## 2022-08-13 NOTE — Telephone Encounter (Signed)
This has already been updated. 

## 2022-08-21 ENCOUNTER — Other Ambulatory Visit: Payer: Self-pay | Admitting: Nurse Practitioner

## 2022-08-21 MED ORDER — VALSARTAN 40 MG PO TABS: 40.0000 mg | ORAL_TABLET | Freq: Every day | ORAL | 1 refills | Status: DC

## 2022-08-21 MED ORDER — FLUTICASONE PROPIONATE 50 MCG/ACT NA SUSP: 2.0000 | Freq: Every day | NASAL | 3 refills | Status: DC

## 2022-08-21 MED ORDER — ATORVASTATIN CALCIUM 10 MG PO TABS: 10.0000 mg | ORAL_TABLET | Freq: Every day | ORAL | 2 refills | Status: DC

## 2022-08-21 NOTE — Telephone Encounter (Signed)
Requested Prescriptions  Pending Prescriptions Disp Refills   valsartan (DIOVAN) 40 MG tablet 90 tablet 1    Sig: Take 1 tablet (40 mg total) by mouth daily.     Cardiovascular:  Angiotensin Receptor Blockers Passed - 08/21/2022 10:01 AM      Passed - Cr in normal range and within 180 days    Creatinine, Ser  Date Value Ref Range Status  06/19/2022 1.03 0.76 - 1.27 mg/dL Final         Passed - K in normal range and within 180 days    Potassium  Date Value Ref Range Status  06/19/2022 4.4 3.5 - 5.2 mmol/L Final         Passed - Patient is not pregnant      Passed - Last BP in normal range    BP Readings from Last 1 Encounters:  06/23/22 125/67         Passed - Valid encounter within last 6 months    Recent Outpatient Visits           1 month ago Bradycardia   [place] [facility] [place], Sherran Needs, NP   2 months ago Aortic atherosclerosis [facility])   [place] [facility] Larae Grooms, NP   8 months ago Annual physical exam   [place] [facility] Larae Grooms, NP   1 year ago Essential hypertension   [place] [facility] Larae Grooms, NP   1 year ago Viral upper respiratory tract infection   [place] [facility] Larae Grooms, NP       Future Appointments             In 1 week Debbe Odea, MD [facility] [place]   In 4 months Larae Grooms, NP [facility]             fluticasone (FLONASE) 50 MCG/ACT nasal spray 48 g 3    Sig: Place 2 sprays into both nostrils daily.     Ear, Nose, and Throat: Nasal Preparations - Corticosteroids Passed - 08/21/2022 10:01 AM      Passed - Valid encounter within last 12 months    Recent Outpatient Visits           1 month ago Bradycardia   [place] [facility] [place], Sherran Needs, NP   2 months ago Aortic atherosclerosis [facility])   [place]  [facility] Larae Grooms, NP   8 months ago Annual physical exam   [place] Larae Grooms, NP   1 year ago Essential hypertension   [place] [facility] Larae Grooms, NP   1 year ago Viral upper respiratory tract infection   [place] [facility] Larae Grooms, NP       Future Appointments             In 1 week Debbe Odea, MD [facility] [place]   In 4 months Larae Grooms, NP [facility]             atorvastatin (LIPITOR) 10 MG tablet 90 tablet 2    Sig: Take 1 tablet (10 mg total) by mouth daily.     Cardiovascular:  Antilipid - Statins Failed - 08/21/2022 10:01 AM      Failed - Lipid Panel in normal range within the last 12 months    Cholesterol, Total  Date  Value Ref Range Status  06/19/2022 160 100 - 199 mg/dL Final   Cholesterol [place]  Date Value Ref Range Status  01/23/2015 243 (H) <200 mg/dL Final    Comment:                            Desirable                <200                         Borderline High      200- 239                         High                     >239    LDL Chol Calc (NIH)  Date Value Ref Range Status  06/19/2022 80 0 - 99 mg/dL Final   HDL  Date Value Ref Range Status  06/19/2022 65 >39 mg/dL Final   Triglycerides  Date Value Ref Range Status  06/19/2022 82 0 - 149 mg/dL Final   Triglycerides Piccolo,Waived  Date Value Ref Range Status  01/23/2015 107 <150 mg/dL Final    Comment:                            Normal                   <150                         Borderline High     150 - 199                         High                200 - 499                         Very High                >499          Passed - Patient is not pregnant      Passed - Valid encounter within last 12 months    Recent Outpatient Visits           1 month ago Bradycardia   [place]  [facility] [place], Sherran Needs, NP   2 months ago Aortic atherosclerosis [facility])   [place] [facility] Larae Grooms, NP   8 months ago Annual physical exam   [place] [facility] Larae Grooms, NP   1 year ago Essential hypertension   [place] [facility] Larae Grooms, NP   1 year ago Viral upper respiratory tract infection   [place] [facility] Larae Grooms, NP       Future Appointments             In 1 week [place], Arlys John, MD [facility] [place]   In 4 months Larae Grooms, NP [facility] [facility]

## 2022-08-21 NOTE — Telephone Encounter (Signed)
Medication Refill - Medication: valsartan (DIOVAN) 40 MG tablet, fluticasone (FLONASE) 50 MCG/ACT nasal spray, and atorvastatin (LIPITOR) 10 MG tablet   Has the patient contacted their pharmacy? Yes.    Preferred Pharmacy (with phone number or street name):  [facility] - [place], [place] - [address] Phone: [phone]  Fax: [phone]     Has the patient been seen for an appointment in the last year OR does the patient have an upcoming appointment? Yes.    Agent: Please be advised that RX refills may take up to 3 business days. We ask that you follow-up with your pharmacy.

## 2022-08-25 NOTE — Telephone Encounter (Signed)
Or does this need to be sent to [facility] considering there are prescription instructions at the bottom?

## 2022-08-25 NOTE — Telephone Encounter (Signed)
This has already been updated, see messages below.  Pt is wanting for Clydie Braun to reach out to him it seems like.

## 2022-08-26 ENCOUNTER — Telehealth: Payer: Self-pay

## 2022-08-28 ENCOUNTER — Encounter: Payer: Self-pay | Admitting: Cardiology

## 2022-08-28 ENCOUNTER — Ambulatory Visit: Payer: 59 | Attending: Cardiology | Admitting: Cardiology

## 2022-08-28 VITALS — BP 138/74 | HR 45 | Resp 96 | Ht 72.0 in | Wt 209.8 lb

## 2022-08-28 DIAGNOSIS — I10 Essential (primary) hypertension: Secondary | ICD-10-CM | POA: Diagnosis not present

## 2022-08-28 DIAGNOSIS — E7800 Pure hypercholesterolemia, unspecified: Secondary | ICD-10-CM | POA: Diagnosis not present

## 2022-08-28 DIAGNOSIS — F17200 Nicotine dependence, unspecified, uncomplicated: Secondary | ICD-10-CM

## 2022-08-28 DIAGNOSIS — R001 Bradycardia, unspecified: Secondary | ICD-10-CM | POA: Diagnosis not present

## 2022-08-28 NOTE — Patient Instructions (Signed)
Medication Instructions:   Your physician recommends that you continue on your current medications as directed. Please refer to the Current Medication list given to you today.  *If you need a refill on your cardiac medications before your next appointment, please call your pharmacy*   Lab Work:  None Ordered  If you have labs (blood work) drawn today and your tests are completely normal, you will receive your results only by: MyChart Message (if you have MyChart) OR A paper copy in the mail If you have any lab test that is abnormal or we need to change your treatment, we will call you to review the results.   Testing/Procedures:  Your physician has requested that you have an echocardiogram. Echocardiography is a painless test that uses sound waves to create images of your heart. It provides your doctor with information about the size and shape of your heart and how well your heart's chambers and valves are working. This procedure takes approximately one hour. There are no restrictions for this procedure. Please do NOT wear cologne, perfume, aftershave, or lotions (deodorant is allowed). Please arrive 15 minutes prior to your appointment time.    Follow-Up: At [facility], you and your health needs are our priority.  As part of our continuing mission to provide you with exceptional heart care, we have created designated Provider Care Teams.  These Care Teams include your primary Cardiologist (physician) and Advanced Practice Providers (APPs -  Physician Assistants and Nurse Practitioners) who all work together to provide you with the care you need, when you need it.  We recommend signing up for the patient portal called "MyChart".  Sign up information is provided on this After Visit Summary.  MyChart is used to connect with patients for Virtual Visits (Telemedicine).  Patients are able to view lab/test results, encounter notes, upcoming appointments, etc.  Non-urgent messages can  be sent to your provider as well.   To learn more about what you can do with MyChart, go to [email or web address].    Your next appointment:   1 year(s)  Provider:   You may see Debbe Odea, MD or one of the following Advanced Practice Providers on your designated Care Team:   Nicolasa Ducking, NP Eula Listen, PA-C Cadence Fransico Michael, PA-C Charlsie Quest, NP

## 2022-08-28 NOTE — Progress Notes (Signed)
Cardiology Office Note:    Date:  08/28/2022   ID:  Jaamal, Sandra 05-31-1940, MRN [id number]  PCP:  Larae Grooms, NP  Cardiologist:  Debbe Odea, MD  Electrophysiologist:  None   Referring MD: Larae Grooms, NP   Chief Complaint  Patient presents with   New Patient (Initial Visit)    Referred for cardiac evaluation of Bradycardia.  Last seen by Dr. Azucena Cecil in 2020.    History of Present Illness:    Matthew Randolph is a 82 y.o. male with a hx of hypertension, hyperlipidemia, sinus bradycardia, current smoker x40 years who presents due to bradycardia.    Patient previously seen by myself back in 2020 for preop evaluation.  He has a known history of bradycardia documented per EKG back in 2019.  Denies any dizziness, chest pain or shortness of breath.  He still smokes, is working on quitting.  Feels well, no concerns at this time.  Past Medical History:  Diagnosis Date   Arthritis    Benign tumor of pituitary gland (HCC)    Chronic kidney disease    ED (erectile dysfunction)    Erectile dysfunction    GERD (gastroesophageal reflux disease)    High risk sexual behavior    Hyperlipidemia    Hypertension    Pneumonia 2012   Tobacco use disorder, continuous     Past Surgical History:  Procedure Laterality Date   COLONOSCOPY  2015   DENTAL SURGERY  2024   LIPOMA EXCISION Left 10/04/2018   Procedure: EXCISION LIPOMA LEFT HIP;  Surgeon: Kennedy Bucker, MD;  Location: [facility] ORS;  Service: Orthopedics;  Laterality: Left;   PITUITARY SURGERY  12/10/2015   TOTAL HIP ARTHROPLASTY Right 09/09/2017   Procedure: TOTAL HIP ARTHROPLASTY ANTERIOR APPROACH;  Surgeon: Kennedy Bucker, MD;  Location: [facility] ORS;  Service: Orthopedics;  Laterality: Right;   TOTAL HIP ARTHROPLASTY Left 10/04/2018   Procedure: TOTAL HIP ARTHROPLASTY ANTERIOR APPROACH;  Surgeon: Kennedy Bucker, MD;  Location: [facility] ORS;  Service: Orthopedics;  Laterality: Left;    Current Medications: Current  Meds  Medication Sig   acetaminophen (TYLENOL) 500 MG tablet Take 500 mg by mouth every 6 (six) hours as needed.   albuterol (VENTOLIN HFA) 108 (90 Base) MCG/ACT inhaler Inhale 2 puffs into the lungs every 6 (six) hours as needed for wheezing or shortness of breath.   atorvastatin (LIPITOR) 10 MG tablet Take 1 tablet (10 mg total) by mouth daily.   budesonide-formoterol (SYMBICORT) 80-4.5 MCG/ACT inhaler Take 2 puffs first thing in am and then another 2 puffs about 12 hours later.   famotidine (PEPCID) 20 MG tablet One after supper   fluticasone (FLONASE) 50 MCG/ACT nasal spray Place 2 sprays into both nostrils daily.   meloxicam (MOBIC) 15 MG tablet Take 1 tablet (15 mg total) by mouth daily.   Menthol, Topical Analgesic, (ICY HOT EX) Apply 1 application topically daily as needed (pain).   Multiple Vitamin (MULTIVITAMIN) tablet Take 1 tablet by mouth daily.   valsartan (DIOVAN) 40 MG tablet Take 1 tablet (40 mg total) by mouth daily.     Allergies:   Patient has no known allergies.   Social History   Socioeconomic History   Marital status: Divorced    Spouse name: Not on file   Number of children: 4   Years of education: 12th   Highest education level: 12th grade  Occupational History   Occupation: Retired Systems developer  Tobacco Use   Smoking status: Every Day  Current packs/day: 0.25    Average packs/day: 0.3 packs/day for 40.0 years (10.0 ttl pk-yrs)    Types: Cigarettes   Smokeless tobacco: Never   Tobacco comments:    2-3 cig daily 10/29/2020  Vaping Use   Vaping status: Never Used  Substance and Sexual Activity   Alcohol use: Not Currently   Drug use: No   Sexual activity: Yes    Partners: Female  Other Topics Concern   Not on file  Social History Narrative   ** Merged History Encounter **       Social Determinants of Health   Financial Resource Strain: Low Risk  (06/16/2022)   Overall Financial Resource Strain (CARDIA)    Difficulty of Paying Living  Expenses: Not hard at all  Food Insecurity: No Food Insecurity (06/16/2022)   Hunger Vital Sign    Worried About Running Out of Food in the Last Year: Never true    Ran Out of Food in the Last Year: Never true  Transportation Needs: No Transportation Needs (06/16/2022)   PRAPARE - Administrator, Civil Service (Medical): No    Lack of Transportation (Non-Medical): No  Physical Activity: Insufficiently Active (06/16/2022)   Exercise Vital Sign    Days of Exercise per Week: 4 days    Minutes of Exercise per Session: 20 min  Stress: No Stress Concern Present (06/16/2022)   [employer] of Occupational Health - Occupational Stress Questionnaire    Feeling of Stress : Not at all  Social Connections: Moderately Isolated (06/16/2022)   Social Connection and Isolation Panel [NHANES]    Frequency of Communication with Friends and Family: More than three times a week    Frequency of Social Gatherings with Friends and Family: Three times a week    Attends Religious Services: More than 4 times per year    Active Member of Clubs or Organizations: No    Attends Banker Meetings: Never    Marital Status: Divorced     Family History: The patient's family history includes Aneurysm in his brother; Brain cancer in his mother; Cancer in his mother; Heart disease in his father and sister; Hypertension in his brother; Lung disease in his mother; Obesity in his sister; Pneumonia in his brother; Stroke in his father; Thyroid disease in his mother.  ROS:   Please see the history of present illness.     All other systems reviewed and are negative.  EKGs/Labs/Other Studies Reviewed:    The following studies were reviewed today:   EKG Interpretation Date/Time:  Friday August 28 2022 09:48:57 EDT Ventricular Rate:  45 PR Interval:  276 QRS Duration:  110 QT Interval:  412 QTC Calculation: 356 R Axis:   -62  Text Interpretation: Sinus bradycardia with sinus arrhythmia with  1st degree A-V block Left axis deviation Minimal voltage criteria for LVH, may be normal variant ( R in aVL ) Inferior infarct , age undetermined Confirmed by Debbe Odea ([id number]) on 08/28/2022 9:58:35 AM    Recent Labs: 12/18/2021: TSH 1.350 06/19/2022: ALT 21; BUN 12; Creatinine, Ser 1.03; Hemoglobin 13.1; Platelets 200; Potassium 4.4; Sodium 142  Recent Lipid Panel    Component Value Date/Time   CHOL 160 06/19/2022 0905   CHOL 243 (H) 01/23/2015 1048   TRIG 82 06/19/2022 0905   TRIG 107 01/23/2015 1048   HDL 65 06/19/2022 0905   CHOLHDL 2.5 06/19/2022 0905   VLDL 21 01/23/2015 1048   LDLCALC 80 06/19/2022 0905    Physical  Exam:    VS:  BP 138/74 (BP Location: Left Arm, Patient Position: Sitting, Cuff Size: Normal)   Pulse (!) 45   Resp (!) 96   Ht 6' (1.829 m)   Wt 209 lb 12.8 oz (95.2 kg)   BMI 28.45 kg/m     Wt Readings from Last 3 Encounters:  08/28/22 209 lb 12.8 oz (95.2 kg)  06/23/22 211 lb 9.6 oz (96 kg)  06/19/22 206 lb 3.2 oz (93.5 kg)     GEN:  Well nourished, well developed in no acute distress HEENT: Normal NECK: No JVD; No carotid bruits CARDIAC: Bradycardia, regular, no murmurs, rubs, gallops RESPIRATORY:  Clear to auscultation without rales, wheezing or rhonchi  ABDOMEN: Soft, non-tender, non-distended MUSCULOSKELETAL:  No edema; No deformity  SKIN: Warm and dry NEUROLOGIC:  Alert and oriented x 3 PSYCHIATRIC:  Normal affect   ASSESSMENT:    1. Bradycardia   2. Primary hypertension   3. Pure hypercholesterolemia   4. Smoking    PLAN:    In order of problems listed above:  Sinus bradycardia, chronic, asymptomatic.  Continue to monitor.  Obtain echo to rule out any structural abnormalities.   Hypertension, blood pressure adequately controlled.  Continue valsartan 40 mg daily. Hyperlipidemia, cholesterol controlled, continue Lipitor 10 mg daily .  Follow-up in 1 year unless significant abnormalities noted on echo.  Medication  Adjustments/Labs and Tests Ordered: Current medicines are reviewed at length with the patient today.  Concerns regarding medicines are outlined above.  Orders Placed This Encounter  Procedures   EKG 12-Lead   ECHOCARDIOGRAM COMPLETE   No orders of the defined types were placed in this encounter.   Patient Instructions  Medication Instructions:   Your physician recommends that you continue on your current medications as directed. Please refer to the Current Medication list given to you today.  *If you need a refill on your cardiac medications before your next appointment, please call your pharmacy*   Lab Work:  None Ordered  If you have labs (blood work) drawn today and your tests are completely normal, you will receive your results only by: MyChart Message (if you have MyChart) OR A paper copy in the mail If you have any lab test that is abnormal or we need to change your treatment, we will call you to review the results.   Testing/Procedures:  Your physician has requested that you have an echocardiogram. Echocardiography is a painless test that uses sound waves to create images of your heart. It provides your doctor with information about the size and shape of your heart and how well your heart's chambers and valves are working. This procedure takes approximately one hour. There are no restrictions for this procedure. Please do NOT wear cologne, perfume, aftershave, or lotions (deodorant is allowed). Please arrive 15 minutes prior to your appointment time.    Follow-Up: At [facility], you and your health needs are our priority.  As part of our continuing mission to provide you with exceptional heart care, we have created designated Provider Care Teams.  These Care Teams include your primary Cardiologist (physician) and Advanced Practice Providers (APPs -  Physician Assistants and Nurse Practitioners) who all work together to provide you with the care you need, when  you need it.  We recommend signing up for the patient portal called "MyChart".  Sign up information is provided on this After Visit Summary.  MyChart is used to connect with patients for Virtual Visits (Telemedicine).  Patients are  able to view lab/test results, encounter notes, upcoming appointments, etc.  Non-urgent messages can be sent to your provider as well.   To learn more about what you can do with MyChart, go to [email or web address].    Your next appointment:   1 year(s)  Provider:   You may see Debbe Odea, MD or one of the following Advanced Practice Providers on your designated Care Team:   Nicolasa Ducking, NP Eula Listen, PA-C Cadence Fransico Michael, PA-C Charlsie Quest, NP   Signed, Debbe Odea, MD  08/28/2022 11:18 AM    [facility]

## 2022-09-02 NOTE — Telephone Encounter (Signed)
Patient wants all his medications sent over to a new pharmacy. Please advise?

## 2022-09-08 NOTE — Telephone Encounter (Signed)
Spoke with patient and made him aware of recommendations. Patient was advised to reach out to [facility] and request to have his current medications transferred to [facility] pharmacy. Patient verbalized understanding and has no further questions at this time.

## 2022-09-10 DIAGNOSIS — H903 Sensorineural hearing loss, bilateral: Secondary | ICD-10-CM | POA: Diagnosis not present

## 2022-09-14 ENCOUNTER — Encounter: Payer: Self-pay | Admitting: Nurse Practitioner

## 2022-09-24 ENCOUNTER — Ambulatory Visit: Payer: 59

## 2022-10-19 ENCOUNTER — Encounter: Payer: Self-pay | Admitting: Nurse Practitioner

## 2022-10-29 ENCOUNTER — Other Ambulatory Visit: Payer: Self-pay | Admitting: Nurse Practitioner

## 2022-10-30 NOTE — Telephone Encounter (Signed)
Requested Prescriptions  Pending Prescriptions Disp Refills   valsartan (DIOVAN) 40 MG tablet [Pharmacy Med Name: Valsartan 40 MG Oral Tablet] 100 tablet 0    Sig: TAKE 1 TABLET BY MOUTH DAILY     Cardiovascular:  Angiotensin Receptor Blockers Passed - 10/29/2022  5:22 AM      Passed - Cr in normal range and within 180 days    Creatinine, Ser  Date Value Ref Range Status  06/19/2022 1.03 0.76 - 1.27 mg/dL Final         Passed - K in normal range and within 180 days    Potassium  Date Value Ref Range Status  06/19/2022 4.4 3.5 - 5.2 mmol/L Final         Passed - Patient is not pregnant      Passed - Last BP in normal range    BP Readings from Last 1 Encounters:  08/28/22 138/74         Passed - Valid encounter within last 6 months    Recent Outpatient Visits           4 months ago Bradycardia   [place] [facility], Sherran Needs, NP   4 months ago Aortic atherosclerosis [facility])   [place] Larae Grooms, NP   10 months ago Annual physical exam   [place] [facility] Larae Grooms, NP   1 year ago Essential hypertension   [place] [facility] Larae Grooms, NP   1 year ago Viral upper respiratory tract infection   [place] [facility] Larae Grooms, NP       Future Appointments             In 1 month Larae Grooms, NP [facility]

## 2022-11-11 ENCOUNTER — Telehealth: Payer: Self-pay | Admitting: Nurse Practitioner

## 2022-11-11 NOTE — Telephone Encounter (Signed)
Copied from CRM [id number]. Topic: Medicare AWV >> Nov 11, 2022  3:13 PM Payton Doughty wrote: Reason for CRM: Called LVM 11/11/2022 to schedule Annual Wellness Visit  Verlee Rossetti; Care Guide Ambulatory Clinical Support [place] [facility] Direct Dial: [phone]

## 2022-12-05 DIAGNOSIS — J209 Acute bronchitis, unspecified: Secondary | ICD-10-CM | POA: Diagnosis not present

## 2022-12-11 DIAGNOSIS — R42 Dizziness and giddiness: Secondary | ICD-10-CM | POA: Diagnosis not present

## 2022-12-11 DIAGNOSIS — R55 Syncope and collapse: Secondary | ICD-10-CM | POA: Diagnosis not present

## 2022-12-11 DIAGNOSIS — I10 Essential (primary) hypertension: Secondary | ICD-10-CM | POA: Diagnosis not present

## 2022-12-21 ENCOUNTER — Encounter: Payer: Medicare HMO | Admitting: Nurse Practitioner

## 2022-12-21 NOTE — Progress Notes (Signed)
BP 123/71 (BP Location: Left Arm, Patient Position: Sitting, Cuff Size: Large)   Pulse (!) 45   Temp 97.8 F (36.6 C) (Oral)   Ht 5\' 11"  (1.803 m)   Wt 209 lb 3.2 oz (94.9 kg)   SpO2 96%   BMI 29.18 kg/m    Subjective:    Patient ID: Matthew Randolph, male    DOB: [birth year], 82 y.o.   MRN: [id number]  HPI: Matthew Randolph is a 82 y.o. male presenting on 12/22/2022 for comprehensive medical examination. Current medical complaints include:none  He currently lives with: Interim Problems from his last visit: no  HYPERTENSION with Chronic Kidney Disease He saw Cardiology in August.  Did not get his ECHO done.   Hypertension status: controlled  Satisfied with current treatment? yes Duration of hypertension: years BP monitoring frequency:  not checking BP range:  BP medication side effects:  no Medication compliance: excellent compliance Previous BP meds:valsartan Aspirin: no Recurrent headaches: no Visual changes: no Palpitations: no Dyspnea: no Chest pain: no Lower extremity edema: no Dizzy/lightheaded: no  COPD Patient states he coughed for about 3-4 days.  He took some cough medicine but it improved. Using Symbicort. COPD status: controlled Satisfied with current treatment?: yes Oxygen use: no Dyspnea frequency: sometimes Cough frequency: no cough Rescue inhaler frequency:  only when he is sick Limitation of activity: no Productive cough:  Last Spirometry:  Pneumovax: Up to Date Influenza: Up to Date   Patient states he was outside and had an episode where he got dizzy and fell.  Rescue unit came out and told him his sodium dropped.  He hadn't had too much water.     Functional Status Survey: Is the patient deaf or have difficulty hearing?: Yes Does the patient have difficulty seeing, even when wearing glasses/contacts?: Yes Does the patient have difficulty concentrating, remembering, or making decisions?: Yes Does the patient have difficulty walking or  climbing stairs?: No Does the patient have difficulty dressing or bathing?: No Does the patient have difficulty doing errands alone such as visiting a doctor's office or shopping?: No  FALL RISK:    06/19/2022    8:21 AM 12/18/2021    9:27 AM 11/18/2021   11:37 AM 06/19/2021   10:08 AM 02/10/2021   10:13 AM  Fall Risk   Falls in the past year? 0 0 0 0 0  Number falls in past yr: 0 0 0 0 0  Injury with Fall? 0 0 0 0 0  Risk for fall due to : No Fall Risks No Fall Risks  No Fall Risks No Fall Risks  Follow up Falls evaluation completed Falls evaluation completed Education provided;Falls prevention discussed;Falls evaluation completed Falls evaluation completed Falls evaluation completed    Depression Screen    12/22/2022    8:30 AM 06/19/2022    8:22 AM 12/18/2021    9:28 AM 11/18/2021   11:45 AM 06/19/2021   10:08 AM  Depression screen PHQ 2/9  Decreased Interest 0 3 0 0 0  Down, Depressed, Hopeless 0 0 0 0 0  PHQ - 2 Score 0 3 0 0 0  Altered sleeping 0 0 0 0 0  Tired, decreased energy 0 0 0 0 0  Change in appetite 0 0 0 0 0  Feeling bad or failure about yourself  0 0 0 0 0  Trouble concentrating 0 0 0 0 0  Moving slowly or fidgety/restless 0 0 0 0 0  Suicidal thoughts 0 0 0  0  PHQ-9 Score 0 3 0 0 0  Difficult doing work/chores  Not difficult at all Not difficult at all Not difficult at all Not difficult at all    Advanced Directives Does patient have a HCPOA?  no If yes, name and contact information:  Does patient have a living will or MOST form?  no  Past Medical History:  Past Medical History:  Diagnosis Date   Arthritis    Benign tumor of pituitary gland (HCC)    Chronic kidney disease    ED (erectile dysfunction)    Erectile dysfunction    GERD (gastroesophageal reflux disease)    High risk sexual behavior    Hyperlipidemia    Hypertension    Pneumonia 2012   Tobacco use disorder, continuous     Surgical History:  Past Surgical History:  Procedure  Laterality Date   COLONOSCOPY  2015   DENTAL SURGERY  2024   LIPOMA EXCISION Left 10/04/2018   Procedure: EXCISION LIPOMA LEFT HIP;  Surgeon: Kennedy Bucker, MD;  Location: [facility] OR[facility];  Service: Orthopedics;  Laterality: Left;   PITUITARY SURGERY  12/10/2015   TOTAL HIP ARTHROPLASTY Right 09/09/2017   Procedure: TOTAL HIP ARTHROPLASTY ANTERIOR APPROACH;  Surgeon: Kennedy Bucker, MD;  Location: [facility] OR[facility];  Service: Orthopedics;  Laterality: Right;   TOTAL HIP ARTHROPLASTY Left 10/04/2018   Procedure: TOTAL HIP ARTHROPLASTY ANTERIOR APPROACH;  Surgeon: Kennedy Bucker, MD;  Location: [facility];  Service: Orthopedics;  Laterality: Left;    Medications:  Current Outpatient Medications on File Prior to Visit  Medication Sig   acetaminophen (TYLENOL) 500 MG tablet Take 500 mg by mouth every 6 (six) hours as needed.   albuterol (VENTOLIN HFA) 108 (90 Base) MCG/ACT inhaler Inhale 2 puffs into the lungs every 6 (six) hours as needed for wheezing or shortness of breath.   budesonide-formoterol (SYMBICORT) 80-4.5 MCG/ACT inhaler Take 2 puffs first thing in am and then another 2 puffs about 12 hours later.   famotidine (PEPCID) 20 MG tablet One after supper   fluticasone (FLONASE) 50 MCG/ACT nasal spray Place 2 sprays into both nostrils daily.   meloxicam (MOBIC) 15 MG tablet Take 1 tablet (15 mg total) by mouth daily.   Menthol, Topical Analgesic, (ICY HOT EX) Apply 1 application topically daily as needed (pain).   Multiple Vitamin (MULTIVITAMIN) tablet Take 1 tablet by mouth daily.   No current facility-administered medications on file prior to visit.    Allergies:  No Known Allergies  Social History:  Social History   Socioeconomic History   Marital status: Divorced    Spouse name: Not on file   Number of children: 4   Years of education: 12th   Highest education level: 12th grade  Occupational History   Occupation: Retired Systems developer  Tobacco Use   Smoking status: Every Day    Current  packs/day: 0.25    Average packs/day: 0.3 packs/day for 40.0 years (10.0 ttl pk-yrs)    Types: Cigarettes   Smokeless tobacco: Never   Tobacco comments:    2-3 cig daily 10/29/2020  Vaping Use   Vaping status: Never Used  Substance and Sexual Activity   Alcohol use: Not Currently   Drug use: No   Sexual activity: Yes    Partners: Female  Other Topics Concern   Not on file  Social History Narrative   ** Merged History Encounter **       Social Drivers of Health   Financial Resource Strain: Low Risk  (  06/16/2022)   Overall Financial Resource Strain (CARDIA)    Difficulty of Paying Living Expenses: Not hard at all  Food Insecurity: No Food Insecurity (06/16/2022)   Hunger Vital Sign    Worried About Running Out of Food in the Last Year: Never true    Ran Out of Food in the Last Year: Never true  Transportation Needs: No Transportation Needs (06/16/2022)   PRAPARE - Administrator, Civil Service (Medical): No    Lack of Transportation (Non-Medical): No  Physical Activity: Insufficiently Active (06/16/2022)   Exercise Vital Sign    Days of Exercise per Week: 4 days    Minutes of Exercise per Session: 20 min  Stress: No Stress Concern Present (06/16/2022)   [employer] of Occupational Health - Occupational Stress Questionnaire    Feeling of Stress : Not at all  Social Connections: Moderately Isolated (06/16/2022)   Social Connection and Isolation Panel [NHANES]    Frequency of Communication with Friends and Family: More than three times a week    Frequency of Social Gatherings with Friends and Family: Three times a week    Attends Religious Services: More than 4 times per year    Active Member of Clubs or Organizations: No    Attends Banker Meetings: Not on file    Marital Status: Divorced  Intimate Partner Violence: Not At Risk (11/18/2021)   Humiliation, Afraid, Rape, and Kick questionnaire    Fear of Current or Ex-Partner: No    Emotionally  Abused: No    Physically Abused: No    Sexually Abused: No   Social History   Tobacco Use  Smoking Status Every Day   Current packs/day: 0.25   Average packs/day: 0.3 packs/day for 40.0 years (10.0 ttl pk-yrs)   Types: Cigarettes  Smokeless Tobacco Never  Tobacco Comments   2-3 cig daily 10/29/2020   Social History   Substance and Sexual Activity  Alcohol Use Not Currently    Family History:  Family History  Problem Relation Age of Onset   Brain cancer Mother    Cancer Mother        Brain tumor   Thyroid disease Mother    Lung disease Mother        From [place]   Heart disease Father    Stroke Father    Hypertension Brother    Aneurysm Brother    Pneumonia Brother    Heart disease Sister        massive MI   Obesity Sister     Past medical history, surgical history, medications, allergies, family history and social history reviewed with patient today and changes made to appropriate areas of the chart.   Review of Systems  Eyes:  Negative for blurred vision and double vision.  Respiratory:  Negative for shortness of breath.   Cardiovascular:  Negative for chest pain, palpitations and leg swelling.  Neurological:  Negative for dizziness and headaches.   All other ROS negative except what is listed above and in the HPI.      Objective:    BP 123/71 (BP Location: Left Arm, Patient Position: Sitting, Cuff Size: Large)   Pulse (!) 45   Temp 97.8 F (36.6 C) (Oral)   Ht 5\' 11"  (1.803 m)   Wt 209 lb 3.2 oz (94.9 kg)   SpO2 96%   BMI 29.18 kg/m   Wt Readings from Last 3 Encounters:  12/22/22 209 lb 3.2 oz (94.9 kg)  08/28/22 209 lb  12.8 oz (95.2 kg)  06/23/22 211 lb 9.6 oz (96 kg)    No results found.  Physical Exam Vitals and nursing note reviewed.  Constitutional:      General: He is not in acute distress.    Appearance: Normal appearance. He is not ill-appearing, toxic-appearing or diaphoretic.  HENT:     Head: Normocephalic.     Right Ear: Tympanic  membrane, ear canal and external ear normal.     Left Ear: Tympanic membrane, ear canal and external ear normal.     Nose: Nose normal. No congestion or rhinorrhea.     Mouth/Throat:     Mouth: Mucous membranes are moist.  Eyes:     General:        Right eye: No discharge.        Left eye: No discharge.     Extraocular Movements: Extraocular movements intact.     Conjunctiva/sclera: Conjunctivae normal.     Pupils: Pupils are equal, round, and reactive to light.  Cardiovascular:     Rate and Rhythm: Normal rate and regular rhythm.     Heart sounds: No murmur heard. Pulmonary:     Effort: Pulmonary effort is normal. No respiratory distress.     Breath sounds: Normal breath sounds. No wheezing, rhonchi or rales.  Abdominal:     General: Abdomen is flat. Bowel sounds are normal. There is no distension.     Palpations: Abdomen is soft.     Tenderness: There is no abdominal tenderness. There is no guarding.  Musculoskeletal:     Cervical back: Normal range of motion and neck supple.  Skin:    General: Skin is warm and dry.     Capillary Refill: Capillary refill takes less than 2 seconds.  Neurological:     General: No focal deficit present.     Mental Status: He is alert and oriented to person, place, and time.     Cranial Nerves: No cranial nerve deficit.     Motor: No weakness.     Deep Tendon Reflexes: Reflexes normal.  Psychiatric:        Mood and Affect: Mood normal.        Behavior: Behavior normal.        Thought Content: Thought content normal.        Judgment: Judgment normal.        12/22/2022    8:23 AM 11/18/2021   11:40 AM 09/23/2020    5:16 PM 09/18/2019    3:27 PM 08/05/2017    9:27 AM  6CIT Screen  What Year? 0 points 0 points 0 points 0 points 0 points  What month? 0 points 0 points 3 points 0 points 0 points  What time? 3 points 0 points 0 points 0 points 0 points  Count back from 20 0 points 0 points 0 points 2 points 0 points  Months in reverse 4 points  4 points 2 points 4 points 0 points  Repeat phrase 2 points 0 points 0 points 0 points 2 points  Total Score 9 points 4 points 5 points 6 points 2 points      Results for orders placed or performed in visit on 06/19/22  Comp Met (CMET)   Collection Time: 06/19/22  9:05 AM  Result Value Ref Range   Glucose 97 70 - 99 mg/dL   BUN 12 8 - 27 mg/dL   Creatinine, Ser 1.61 0.76 - 1.27 mg/dL   eGFR 74 >09 UE/AVW/0.98  BUN/Creatinine Ratio 12 10 - 24   Sodium 142 134 - 144 mmol/L   Potassium 4.4 3.5 - 5.2 mmol/L   Chloride 105 96 - 106 mmol/L   CO2 22 20 - 29 mmol/L   Calcium 9.7 8.6 - 10.2 mg/dL   Total Protein 7.0 6.0 - 8.5 g/dL   Albumin 4.4 3.8 - 4.8 g/dL   Globulin, Total 2.6 1.5 - 4.5 g/dL   Bilirubin Total 0.7 0.0 - 1.2 mg/dL   Alkaline Phosphatase 54 44 - 121 IU/L   AST 22 0 - 40 IU/L   ALT 21 0 - 44 IU/L  Lipid Profile   Collection Time: 06/19/22  9:05 AM  Result Value Ref Range   Cholesterol, Total 160 100 - 199 mg/dL   Triglycerides 82 0 - 149 mg/dL   HDL 65 >41 mg/dL   VLDL Cholesterol Cal 15 5 - 40 mg/dL   LDL Chol Calc (NIH) 80 0 - 99 mg/dL   Chol/HDL Ratio 2.5 0.0 - 5.0 ratio  CBC w/Diff   Collection Time: 06/19/22  9:05 AM  Result Value Ref Range   WBC 4.9 3.4 - 10.8 x10E3/uL   RBC 4.68 4.14 - 5.80 x10E6/uL   Hemoglobin 13.1 13.0 - 17.7 g/dL   Hematocrit 32.4 40.1 - 51.0 %   MCV 90 79 - 97 fL   MCH 28.0 26.6 - 33.0 pg   MCHC 31.0 (L) 31.5 - 35.7 g/dL   RDW 02.7 25.3 - 66.4 %   Platelets 200 150 - 450 x10E3/uL   Neutrophils 38 Not Estab. %   Lymphs 52 Not Estab. %   Monocytes 8 Not Estab. %   Eos 1 Not Estab. %   Basos 1 Not Estab. %   Neutrophils Absolute 1.9 1.4 - 7.0 x10E3/uL   Lymphocytes Absolute 2.5 0.7 - 3.1 x10E3/uL   Monocytes Absolute 0.4 0.1 - 0.9 x10E3/uL   EOS (ABSOLUTE) 0.1 0.0 - 0.4 x10E3/uL   Basophils Absolute 0.1 0.0 - 0.2 x10E3/uL   Immature Granulocytes 0 Not Estab. %   Immature Grans (Abs) 0.0 0.0 - 0.1 x10E3/uL       Assessment & Plan:   Problem List Items Addressed This Visit       Cardiovascular and Mediastinum   Essential hypertension   Chronic.  Controlled.   Continue Valsartan to 40mg  daily.  Labs ordered today.  Refills sent today.  Discussed symptoms of low blood pressure.  Running low in office today but higher at home.  States he isn't feeling any symptoms and hasn't had anything to eat or drink.  Return to clinic in 6 months for reevaluation.  Call sooner if concerns arise.       Relevant Medications   atorvastatin (LIPITOR) 10 MG tablet   valsartan (DIOVAN) 40 MG tablet   Aortic atherosclerosis (HCC)   Chronic.  Controlled.  Continue with current medication regimen on Atorvastatin daily.  Refills sent today.  Labs ordered today.  Return to clinic in 6 months for reevaluation.  Call sooner if concerns arise.       Relevant Medications   atorvastatin (LIPITOR) 10 MG tablet   valsartan (DIOVAN) 40 MG tablet     Endocrine   Pituitary adenoma (HCC)   Under good control on current regimen. Continue current regimen. Continue to monitor. Call with any concerns. Labs drawn today.         Genitourinary   CKD (chronic kidney disease)   Chronic.  Controlled.  Continue with current  medication regimen of Valsartan.  Labs ordered today.  Return to clinic in 6 months for reevaluation.  Call sooner if concerns arise.          Other   Hyperlipemia   Chronic.  Controlled.  Continue with current medication regimen on Atorvastatin 10mg  daily.  Refill sent today.  Labs ordered today.  Return to clinic in 6 months for reevaluation.  Call sooner if concerns arise.       Relevant Medications   atorvastatin (LIPITOR) 10 MG tablet   valsartan (DIOVAN) 40 MG tablet   Other Relevant Orders   Lipid panel   Advanced care planning/counseling discussion   A voluntary discussion about advance care planning including the explanation and discussion of advance directives was extensively discussed  with the  patient for 7 minutes with patient and partner, Barbette Merino present.  Explanation about the health care proxy and Living will was reviewed and packet with forms with explanation of how to fill them out was given.  During this discussion, the patient was able to identify a health care proxy as Barbette Merino and plans to fill out the paperwork required.  Patient was offered a separate Advance Care Planning visit for further assistance with forms.         Other Visit Diagnoses       Encounter for annual wellness exam in Medicare patient    -  Primary     Annual physical exam       Health maintnenace reviewed during visit today.  Labs ordered.  Vaccines reviewed.  Colonoscopy up to date.   Relevant Orders   TSH   PSA   Lipid panel   CBC with Differential/Platelet   Comprehensive metabolic panel   Urinalysis, Routine w reflex microscopic     Screening examination for STI       Relevant Orders   HIV Antibody (routine testing w rflx)   RPR   Hepatitis C antibody   Chlamydia/Gonococcus/Trichomonas, NAA     Tobacco use       Relevant Orders   Ambulatory Referral Lung Cancer Screening [facility]        Preventative Services:  Health Risk Assessment and Personalized Prevention Plan: Up to date Bone Mass Measurements: NA CVD Screening: Up to date Colon Cancer Screening: Up to date Depression Screening: Up to date Diabetes Screening: Up to date Glaucoma Screening: Up to date Hepatitis B vaccine: NA Hepatitis C screening: Up to date HIV Screening: Up to date Flu Vaccine: Up to date Lung cancer Screening: Ordered today Obesity Screening: Up to date Pneumonia Vaccines (2): Up to date STI Screening: Done today PSA screening: Done today  Discussed aspirin prophylaxis for myocardial infarction prevention and decision was it was not indicated  LABORATORY TESTING:  Health maintenance labs ordered today as discussed above.   The natural history of prostate cancer and ongoing  controversy regarding screening and potential treatment outcomes of prostate cancer has been discussed with the patient. The meaning of a false positive PSA and a false negative PSA has been discussed. He indicates understanding of the limitations of this screening test and wishes to proceed with screening PSA testing.   IMMUNIZATIONS:   - Tdap: Tetanus vaccination status reviewed: last tetanus booster within 10 years. - Influenza: Up to date - Pneumovax: Up to date - Prevnar: Up to date - Zostavax vaccine:  Declined  SCREENING: - Colonoscopy: Up to date  Discussed with patient purpose of the colonoscopy is to detect colon  cancer at curable precancerous or early stages   - AAA Screening: Not applicable  -Hearing Test: Not applicable  -Spirometry: Not applicable   PATIENT COUNSELING:    Sexuality: Discussed sexually transmitted diseases, partner selection, use of condoms, avoidance of unintended pregnancy  and contraceptive alternatives.   Advised to avoid cigarette smoking.  I discussed with the patient that most people either abstain from alcohol or drink within safe limits (<=14/week and <=4 drinks/occasion for males, <=7/weeks and <= 3 drinks/occasion for females) and that the risk for alcohol disorders and other health effects rises proportionally with the number of drinks per week and how often a drinker exceeds daily limits.  Discussed cessation/primary prevention of drug use and availability of treatment for abuse.   Diet: Encouraged to adjust caloric intake to maintain  or achieve ideal body weight, to reduce intake of dietary saturated fat and total fat, to limit sodium intake by avoiding high sodium foods and not adding table salt, and to maintain adequate dietary potassium and calcium preferably from fresh fruits, vegetables, and low-fat dairy products.    stressed the importance of regular exercise  Injury prevention: Discussed safety belts, safety helmets, smoke detector,  smoking near bedding or upholstery.   Dental health: Discussed importance of regular tooth brushing, flossing, and dental visits.   Follow up plan: NEXT PREVENTATIVE PHYSICAL DUE IN 1 YEAR. Return in about 6 months (around 06/22/2023) for HTN, HLD, DM2 FU.

## 2022-12-22 ENCOUNTER — Encounter: Payer: Self-pay | Admitting: Nurse Practitioner

## 2022-12-22 ENCOUNTER — Ambulatory Visit: Payer: 59 | Admitting: Nurse Practitioner

## 2022-12-22 VITALS — BP 123/71 | HR 45 | Temp 97.8°F | Ht 71.0 in | Wt 209.2 lb

## 2022-12-22 DIAGNOSIS — I10 Essential (primary) hypertension: Secondary | ICD-10-CM

## 2022-12-22 DIAGNOSIS — N1831 Chronic kidney disease, stage 3a: Secondary | ICD-10-CM

## 2022-12-22 DIAGNOSIS — I700 Atherosclerosis of aorta: Secondary | ICD-10-CM

## 2022-12-22 DIAGNOSIS — D352 Benign neoplasm of pituitary gland: Secondary | ICD-10-CM | POA: Diagnosis not present

## 2022-12-22 DIAGNOSIS — N189 Chronic kidney disease, unspecified: Secondary | ICD-10-CM

## 2022-12-22 DIAGNOSIS — E785 Hyperlipidemia, unspecified: Secondary | ICD-10-CM

## 2022-12-22 DIAGNOSIS — Z113 Encounter for screening for infections with a predominantly sexual mode of transmission: Secondary | ICD-10-CM

## 2022-12-22 DIAGNOSIS — Z7189 Other specified counseling: Secondary | ICD-10-CM | POA: Diagnosis not present

## 2022-12-22 DIAGNOSIS — E782 Mixed hyperlipidemia: Secondary | ICD-10-CM

## 2022-12-22 DIAGNOSIS — Z0000 Encounter for general adult medical examination without abnormal findings: Secondary | ICD-10-CM

## 2022-12-22 DIAGNOSIS — Z720 Tobacco use: Secondary | ICD-10-CM | POA: Diagnosis not present

## 2022-12-22 LAB — URINALYSIS, ROUTINE W REFLEX MICROSCOPIC
Bilirubin, UA: NEGATIVE
Glucose, UA: NEGATIVE
Ketones, UA: NEGATIVE
Leukocytes,UA: NEGATIVE
Nitrite, UA: NEGATIVE
Protein,UA: NEGATIVE
RBC, UA: NEGATIVE
Specific Gravity, UA: >1.03 — ABNORMAL HIGH (ref 1.005–1.030)
Urobilinogen, Ur: 0.2 mg/dL (ref 0.2–1.0)
pH, UA: 5.5 (ref 5.0–7.5)

## 2022-12-22 MED ORDER — ATORVASTATIN CALCIUM 10 MG PO TABS: 10.0000 mg | ORAL_TABLET | Freq: Every day | ORAL | 1 refills | Status: DC

## 2022-12-22 MED ORDER — VALSARTAN 40 MG PO TABS: 40.0000 mg | ORAL_TABLET | Freq: Every day | ORAL | 1 refills | Status: DC

## 2022-12-22 NOTE — Assessment & Plan Note (Signed)
Chronic.  Controlled.  Continue with current medication regimen of Valsartan.  Labs ordered today.  Return to clinic in 6 months for reevaluation.  Call sooner if concerns arise.

## 2022-12-22 NOTE — Assessment & Plan Note (Signed)
A voluntary discussion about advance care planning including the explanation and discussion of advance directives was extensively discussed  with the patient for 7 minutes with patient and partner, Barbette Merino present.  Explanation about the health care proxy and Living will was reviewed and packet with forms with explanation of how to fill them out was given.  During this discussion, the patient was able to identify a health care proxy as Barbette Merino and plans to fill out the paperwork required.  Patient was offered a separate Advance Care Planning visit for further assistance with forms.

## 2022-12-22 NOTE — Assessment & Plan Note (Signed)
Chronic.  Controlled.  Continue with current medication regimen on Atorvastatin daily.  Refills sent today.  Labs ordered today.  Return to clinic in 6 months for reevaluation.  Call sooner if concerns arise.   

## 2022-12-22 NOTE — Assessment & Plan Note (Signed)
Under good control on current regimen. Continue current regimen. Continue to monitor. Call with any concerns. Labs drawn today.  

## 2022-12-22 NOTE — Assessment & Plan Note (Signed)
Chronic.  Controlled.  Continue with current medication regimen on Atorvastatin 10mg daily.  Refill sent today.  Labs ordered today.  Return to clinic in 6 months for reevaluation.  Call sooner if concerns arise.   

## 2022-12-22 NOTE — Assessment & Plan Note (Signed)
Chronic.  Controlled.   Continue Valsartan to 40mg  daily.  Labs ordered today.  Refills sent today.  Discussed symptoms of low blood pressure.  Running low in office today but higher at home.  States he isn't feeling any symptoms and hasn't had anything to eat or drink.  Return to clinic in 6 months for reevaluation.  Call sooner if concerns arise.

## 2022-12-23 LAB — PSA: Prostate Specific Ag, Serum: 2.4 ng/mL (ref 0.0–4.0)

## 2022-12-23 LAB — COMPREHENSIVE METABOLIC PANEL
ALT: 26 [IU]/L (ref 0–44)
AST: 21 [IU]/L (ref 0–40)
Albumin: 4.1 g/dL (ref 3.8–4.8)
Alkaline Phosphatase: 61 [IU]/L (ref 44–121)
BUN/Creatinine Ratio: 10 (ref 10–24)
BUN: 11 mg/dL (ref 8–27)
Bilirubin Total: 0.5 mg/dL (ref 0.0–1.2)
CO2: 22 mmol/L (ref 20–29)
Calcium: 9.3 mg/dL (ref 8.6–10.2)
Chloride: 104 mmol/L (ref 96–106)
Creatinine, Ser: 1.14 mg/dL (ref 0.76–1.27)
Globulin, Total: 2.5 g/dL (ref 1.5–4.5)
Glucose: 105 mg/dL — ABNORMAL HIGH (ref 70–99)
Potassium: 4.2 mmol/L (ref 3.5–5.2)
Sodium: 139 mmol/L (ref 134–144)
Total Protein: 6.6 g/dL (ref 6.0–8.5)
eGFR: 65 mL/min/{1.73_m2} (ref >59)

## 2022-12-23 LAB — CBC WITH DIFFERENTIAL/PLATELET
Basophils Absolute: 0.1 10*3/uL (ref 0.0–0.2)
Basos: 1 %
EOS (ABSOLUTE): 0.1 10*3/uL (ref 0.0–0.4)
Eos: 2 %
Hematocrit: 39.2 % (ref 37.5–51.0)
Hemoglobin: 12.3 g/dL — ABNORMAL LOW (ref 13.0–17.7)
Immature Grans (Abs): 0 10*3/uL (ref 0.0–0.1)
Immature Granulocytes: 0 %
Lymphocytes Absolute: 3 10*3/uL (ref 0.7–3.1)
Lymphs: 47 %
MCH: 27.8 pg (ref 26.6–33.0)
MCHC: 31.4 g/dL — ABNORMAL LOW (ref 31.5–35.7)
MCV: 89 fL (ref 79–97)
Monocytes Absolute: 0.5 10*3/uL (ref 0.1–0.9)
Monocytes: 8 %
Neutrophils Absolute: 2.6 10*3/uL (ref 1.4–7.0)
Neutrophils: 42 %
Platelets: 268 10*3/uL (ref 150–450)
RBC: 4.42 x10E6/uL (ref 4.14–5.80)
RDW: 12.3 % (ref 11.6–15.4)
WBC: 6.2 10*3/uL (ref 3.4–10.8)

## 2022-12-23 LAB — TSH: TSH: 1.43 u[IU]/mL (ref 0.450–4.500)

## 2022-12-23 LAB — LIPID PANEL
Chol/HDL Ratio: 2.3 {ratio} (ref 0.0–5.0)
Cholesterol, Total: 162 mg/dL (ref 100–199)
HDL: 71 mg/dL (ref >39)
LDL Chol Calc (NIH): 77 mg/dL (ref 0–99)
Triglycerides: 70 mg/dL (ref 0–149)
VLDL Cholesterol Cal: 14 mg/dL (ref 5–40)

## 2022-12-23 LAB — HEPATITIS C ANTIBODY: Hep C Virus Ab: NONREACTIVE

## 2022-12-23 LAB — RPR: RPR Ser Ql: NONREACTIVE

## 2022-12-23 LAB — HIV ANTIBODY (ROUTINE TESTING W REFLEX): HIV Screen 4th Generation wRfx: NONREACTIVE

## 2022-12-24 LAB — CHLAMYDIA/GONOCOCCUS/TRICHOMONAS, NAA
Chlamydia by NAA: NEGATIVE
Gonococcus by NAA: NEGATIVE
Trich vag by NAA: NEGATIVE

## 2022-12-28 DIAGNOSIS — D352 Benign neoplasm of pituitary gland: Secondary | ICD-10-CM | POA: Diagnosis not present

## 2022-12-28 DIAGNOSIS — H2513 Age-related nuclear cataract, bilateral: Secondary | ICD-10-CM | POA: Diagnosis not present

## 2022-12-28 DIAGNOSIS — H547 Unspecified visual loss: Secondary | ICD-10-CM | POA: Diagnosis not present

## 2023-01-21 ENCOUNTER — Encounter: Payer: Self-pay | Admitting: Nurse Practitioner

## 2023-01-21 NOTE — Telephone Encounter (Signed)
[email or web address]  Can prefill this as best you can for Clydie Braun.

## 2023-02-01 ENCOUNTER — Telehealth: Payer: Self-pay | Admitting: Nurse Practitioner

## 2023-02-01 NOTE — Telephone Encounter (Signed)
Copied from CRM [id number]. Topic: General - Other >> Feb 01, 2023 12:36 PM Priscille Loveless wrote: Reason for CRM: Pt stated that she took form to [facility] and they stated it needed to be redone. It must say only 5 years (the most you can have) and then choose 1 2 or 3 on the left and no extra writing..Please advise.

## 2023-02-01 NOTE — Telephone Encounter (Signed)
Pt's friend calledback in again, about redoing handicapped for, Pt stated that she took form to [facility] and they stated it needed to be redone. It must say only 5 years (the most you can have) and then choose 1 2 or 3 on the left and no extra writing.. I let her know cb turn around is 48-72 hrs

## 2023-02-02 NOTE — Telephone Encounter (Signed)
New form started. Placed in providers folder for completion and signature.

## 2023-02-02 NOTE — Telephone Encounter (Signed)
Form signed by Clydie Braun and placed up front for pick up. Copy placed in scan bin.  Called and LVM with patient and [name] notifying them that the form was complete and ready to be picked up.

## 2023-02-02 NOTE — Telephone Encounter (Signed)
Patient was taken care of and form picked up this morning.

## 2023-04-11 ENCOUNTER — Other Ambulatory Visit: Payer: Self-pay | Admitting: Nurse Practitioner

## 2023-04-12 NOTE — Telephone Encounter (Signed)
 Requested Prescriptions  Refused Prescriptions Disp Refills   valsartan (DIOVAN) 40 MG tablet [Pharmacy Med Name: Valsartan 40 MG Oral Tablet] 100 tablet 2    Sig: TAKE 1 TABLET BY MOUTH DAILY     Cardiovascular:  Angiotensin Receptor Blockers Failed - 04/12/2023  5:25 PM      Failed - Valid encounter within last 6 months    Recent Outpatient Visits   None     Future Appointments             In 2 months Larae Grooms, NP [facility]            Passed - Cr in normal range and within 180 days    Creatinine, Ser  Date Value Ref Range Status  12/22/2022 1.14 0.76 - 1.27 mg/dL Final         Passed - K in normal range and within 180 days    Potassium  Date Value Ref Range Status  12/22/2022 4.2 3.5 - 5.2 mmol/L Final         Passed - Patient is not pregnant      Passed - Last BP in normal range    BP Readings from Last 1 Encounters:  12/22/22 123/71

## 2023-04-30 ENCOUNTER — Other Ambulatory Visit: Payer: Self-pay | Admitting: Nurse Practitioner

## 2023-05-03 NOTE — Telephone Encounter (Signed)
 Requested medication (s) are due for refill today: no  Requested medication (s) are on the active medication list: yes  Last refill:  12/22/22 #90 1 RF  Future visit scheduled: yes  Notes to clinic:  pt due to come back to f/u- script was written for 6 months only- was only ordered enough for 6 months   Requested Prescriptions  Pending Prescriptions Disp Refills   atorvastatin  (LIPITOR) 10 MG tablet [Pharmacy Med Name: Atorvastatin  Calcium  10 MG Oral Tablet] 100 tablet 2    Sig: TAKE 1 TABLET BY MOUTH DAILY     Cardiovascular:  Antilipid - Statins Failed - 05/03/2023 10:40 AM      Failed - Valid encounter within last 12 months    Recent Outpatient Visits   None     Future Appointments             In 1 month Aileen Alexanders, NP [facility]            Failed - Lipid Panel in normal range within the last 12 months    Cholesterol, Total  Date Value Ref Range Status  12/22/2022 162 100 - 199 mg/dL Final   Cholesterol Piccolo, Waived  Date Value Ref Range Status  01/23/2015 243 (H) <200 mg/dL Final    Comment:                            Desirable                <200                         Borderline High      200- 239                         High                     >239    LDL Chol Calc (NIH)  Date Value Ref Range Status  12/22/2022 77 0 - 99 mg/dL Final   HDL  Date Value Ref Range Status  12/22/2022 71 >39 mg/dL Final   Triglycerides  Date Value Ref Range Status  12/22/2022 70 0 - 149 mg/dL Final   Triglycerides Piccolo,Waived  Date Value Ref Range Status  01/23/2015 107 <150 mg/dL Final    Comment:                            Normal                   <150                         Borderline High     150 - 199                         High                200 - 499                         Very High                >499          Passed - Patient  is not pregnant

## 2023-06-19 ENCOUNTER — Other Ambulatory Visit: Payer: Self-pay | Admitting: Nurse Practitioner

## 2023-06-22 ENCOUNTER — Encounter: Payer: Self-pay | Admitting: Nurse Practitioner

## 2023-06-22 ENCOUNTER — Ambulatory Visit (INDEPENDENT_AMBULATORY_CARE_PROVIDER_SITE_OTHER): Payer: Self-pay | Admitting: Nurse Practitioner

## 2023-06-22 VITALS — BP 106/53 | HR 41 | Temp 98.6°F | Resp 15 | Ht 70.98 in | Wt 208.8 lb

## 2023-06-22 DIAGNOSIS — I10 Essential (primary) hypertension: Secondary | ICD-10-CM | POA: Diagnosis not present

## 2023-06-22 DIAGNOSIS — D352 Benign neoplasm of pituitary gland: Secondary | ICD-10-CM

## 2023-06-22 DIAGNOSIS — E782 Mixed hyperlipidemia: Secondary | ICD-10-CM | POA: Diagnosis not present

## 2023-06-22 DIAGNOSIS — I700 Atherosclerosis of aorta: Secondary | ICD-10-CM

## 2023-06-22 DIAGNOSIS — N1831 Chronic kidney disease, stage 3a: Secondary | ICD-10-CM | POA: Diagnosis not present

## 2023-06-22 DIAGNOSIS — R7309 Other abnormal glucose: Secondary | ICD-10-CM | POA: Diagnosis not present

## 2023-06-22 DIAGNOSIS — Z113 Encounter for screening for infections with a predominantly sexual mode of transmission: Secondary | ICD-10-CM

## 2023-06-22 MED ORDER — ATORVASTATIN CALCIUM 10 MG PO TABS: 10.0000 mg | ORAL_TABLET | Freq: Every day | ORAL | 1 refills | Status: DC

## 2023-06-22 MED ORDER — MELOXICAM 15 MG PO TABS: 15.0000 mg | ORAL_TABLET | Freq: Every day | ORAL | 1 refills | Status: AC

## 2023-06-22 MED ORDER — VALSARTAN 40 MG PO TABS: 40.0000 mg | ORAL_TABLET | Freq: Every day | ORAL | 1 refills | Status: DC

## 2023-06-22 MED ORDER — BUDESONIDE-FORMOTEROL FUMARATE 80-4.5 MCG/ACT IN AERO: INHALATION_SPRAY | RESPIRATORY_TRACT | 12 refills | Status: DC

## 2023-06-22 NOTE — Assessment & Plan Note (Signed)
Chronic.  Controlled.  Continue with current medication regimen on Atorvastatin 10mg daily.  Refill sent today.  Labs ordered today.  Return to clinic in 6 months for reevaluation.  Call sooner if concerns arise.   

## 2023-06-22 NOTE — Assessment & Plan Note (Signed)
 Labs ordered at visit today.  Will make recommendations based on lab results.

## 2023-06-22 NOTE — Assessment & Plan Note (Signed)
Under good control on current regimen. Continue current regimen. Continue to monitor. Call with any concerns. Labs drawn today.  

## 2023-06-22 NOTE — Telephone Encounter (Signed)
 Requested Prescriptions  Refused Prescriptions Disp Refills   valsartan  (DIOVAN ) 40 MG tablet [Pharmacy Med Name: Valsartan  40 MG Oral Tablet] 80 tablet 3    Sig: TAKE 1 TABLET BY MOUTH DAILY     Cardiovascular:  Angiotensin Receptor Blockers Failed - 06/22/2023 12:29 PM      Failed - Cr in normal range and within 180 days    Creatinine, Ser  Date Value Ref Range Status  12/22/2022 1.14 0.76 - 1.27 mg/dL Final         Failed - K in normal range and within 180 days    Potassium  Date Value Ref Range Status  12/22/2022 4.2 3.5 - 5.2 mmol/L Final         Passed - Patient is not pregnant      Passed - Last BP in normal range    BP Readings from Last 1 Encounters:  06/22/23 (!) 106/53         Passed - Valid encounter within last 6 months    Recent Outpatient Visits           Today Pituitary adenoma [facility])    [facility] Aileen Alexanders, NP

## 2023-06-22 NOTE — Assessment & Plan Note (Signed)
 Chronic.  Controlled.   Continue Valsartan  to 40mg  daily.  Labs ordered today.  Refills sent today.  Has seen Cardiology.  No intervention recommended at this time. Reviewed noted from visit. Return to clinic in 6 months for reevaluation.  Call sooner if concerns arise.

## 2023-06-22 NOTE — Assessment & Plan Note (Signed)
 Chronic.  Controlled.  Continue with current medication regimen.  Need to keep blood pressures under control.  Labs ordered today.  Return to clinic in 6 months for reevaluation.  Call sooner if concerns arise.

## 2023-06-22 NOTE — Assessment & Plan Note (Signed)
Chronic.  Controlled.  Continue with current medication regimen on Atorvastatin daily.  Refills sent today.  Labs ordered today.  Return to clinic in 6 months for reevaluation.  Call sooner if concerns arise.   

## 2023-06-22 NOTE — Progress Notes (Signed)
 BP (!) 106/53 (BP Location: Left Arm, Patient Position: Sitting)   Pulse (!) 41   Temp 98.6 F (37 C) (Oral)   Resp 15   Ht 5' 10.98 (1.803 m)   Wt 208 lb 12.8 oz (94.7 kg)   SpO2 97%   BMI 29.13 kg/m    Subjective:    Patient ID: Matthew Randolph, male    DOB: [birth year], 83 y.o.   MRN: [id number]  HPI: Matthew Randolph is a 83 y.o. male  Chief Complaint  Patient presents with   Hypertension    No home checks recently.    HYPERTENSION / HYPERLIPIDEMIA Doing well. Was seen by Cardiology for Bradycardia but asymptomatic so no intervention was recommended. Satisfied with current treatment? yes Duration of hypertension: years BP monitoring frequency: rarely BP range: not sure BP medication side effects: no Past BP meds: valsartan  Duration of hyperlipidemia: years Cholesterol medication side effects: no Cholesterol supplements: none Past cholesterol medications: atorvastain (lipitor) Medication compliance: excellent compliance Aspirin : no Recent stressors: no Recurrent headaches: no Visual changes: no Palpitations: no Dyspnea: no Chest pain: no Lower extremity edema: no Dizzy/lightheaded: no  CHRONIC KIDNEY DISEASE CKD status: controlled Medications renally dose: yes Previous renal evaluation: no Pneumovax:  Up to Date Influenza Vaccine:  Up to Date       Relevant past medical, surgical, family and social history reviewed and updated as indicated. Interim medical history since our last visit reviewed. Allergies and medications reviewed and updated.  Review of Systems  Constitutional:  Negative for fatigue.  Eyes:  Negative for visual disturbance.  Respiratory:  Negative for chest tightness and shortness of breath.   Cardiovascular:  Negative for chest pain, palpitations and leg swelling.  Gastrointestinal:  Negative for blood in stool and rectal pain.  Genitourinary:  Negative for hematuria.  Neurological:  Negative for dizziness, light-headedness and  headaches.    Per HPI unless specifically indicated above     Objective:    BP (!) 106/53 (BP Location: Left Arm, Patient Position: Sitting)   Pulse (!) 41   Temp 98.6 F (37 C) (Oral)   Resp 15   Ht 5' 10.98 (1.803 m)   Wt 208 lb 12.8 oz (94.7 kg)   SpO2 97%   BMI 29.13 kg/m   Wt Readings from Last 3 Encounters:  06/22/23 208 lb 12.8 oz (94.7 kg)  12/22/22 209 lb 3.2 oz (94.9 kg)  08/28/22 209 lb 12.8 oz (95.2 kg)    Physical Exam Vitals and nursing note reviewed.  Constitutional:      General: He is not in acute distress.    Appearance: Normal appearance. He is not ill-appearing, toxic-appearing or diaphoretic.  HENT:     Head: Normocephalic.     Right Ear: External ear normal.     Left Ear: External ear normal.     Nose: Nose normal. No congestion or rhinorrhea.     Mouth/Throat:     Mouth: Mucous membranes are moist.   Eyes:     General:        Right eye: No discharge.        Left eye: No discharge.     Extraocular Movements: Extraocular movements intact.     Conjunctiva/sclera: Conjunctivae normal.     Pupils: Pupils are equal, round, and reactive to light.    Cardiovascular:     Rate and Rhythm: Regular rhythm. Bradycardia present.     Heart sounds: No murmur heard. Pulmonary:     Effort:  Pulmonary effort is normal. No respiratory distress.     Breath sounds: Normal breath sounds. No wheezing, rhonchi or rales.  Abdominal:     General: Abdomen is flat. Bowel sounds are normal.   Musculoskeletal:     Cervical back: Normal range of motion and neck supple.   Skin:    General: Skin is warm and dry.     Capillary Refill: Capillary refill takes less than 2 seconds.   Neurological:     General: No focal deficit present.     Mental Status: He is alert and oriented to person, place, and time.   Psychiatric:        Mood and Affect: Mood normal.        Behavior: Behavior normal.        Thought Content: Thought content normal.        Judgment: Judgment  normal.     Results for orders placed or performed in visit on 12/22/22  Chlamydia/Gonococcus/Trichomonas, NAA   Collection Time: 12/22/22  8:52 AM   Specimen: Urine   UR  Result Value Ref Range   Chlamydia by NAA Negative Negative   Gonococcus by NAA Negative Negative   Trich vag by NAA Negative Negative  Urinalysis, Routine w reflex microscopic   Collection Time: 12/22/22  8:52 AM  Result Value Ref Range   Specific Gravity, UA >1.030 (H) 1.005 - 1.030   pH, UA 5.5 5.0 - 7.5   Color, UA Yellow Yellow   Appearance Ur Clear Clear   Leukocytes,UA Negative Negative   Protein,UA Negative Negative/Trace   Glucose, UA Negative Negative   Ketones, UA Negative Negative   RBC, UA Negative Negative   Bilirubin, UA Negative Negative   Urobilinogen, Ur 0.2 0.2 - 1.0 mg/dL   Nitrite, UA Negative Negative   Microscopic Examination Comment   TSH   Collection Time: 12/22/22  8:56 AM  Result Value Ref Range   TSH 1.430 0.450 - 4.500 uIU/mL  PSA   Collection Time: 12/22/22  8:56 AM  Result Value Ref Range   Prostate Specific Ag, Serum 2.4 0.0 - 4.0 ng/mL  Lipid panel   Collection Time: 12/22/22  8:56 AM  Result Value Ref Range   Cholesterol, Total 162 100 - 199 mg/dL   Triglycerides 70 0 - 149 mg/dL   HDL 71 >32 mg/dL   VLDL Cholesterol Cal 14 5 - 40 mg/dL   LDL Chol Calc (NIH) 77 0 - 99 mg/dL   Chol/HDL Ratio 2.3 0.0 - 5.0 ratio  CBC with Differential/Platelet   Collection Time: 12/22/22  8:56 AM  Result Value Ref Range   WBC 6.2 3.4 - 10.8 x10E3/uL   RBC 4.42 4.14 - 5.80 x10E6/uL   Hemoglobin 12.3 (L) 13.0 - 17.7 g/dL   Hematocrit 44.0 10.2 - 51.0 %   MCV 89 79 - 97 fL   MCH 27.8 26.6 - 33.0 pg   MCHC 31.4 (L) 31.5 - 35.7 g/dL   RDW 72.5 36.6 - 44.0 %   Platelets 268 150 - 450 x10E3/uL   Neutrophils 42 Not Estab. %   Lymphs 47 Not Estab. %   Monocytes 8 Not Estab. %   Eos 2 Not Estab. %   Basos 1 Not Estab. %   Neutrophils Absolute 2.6 1.4 - 7.0 x10E3/uL   Lymphocytes  Absolute 3.0 0.7 - 3.1 x10E3/uL   Monocytes Absolute 0.5 0.1 - 0.9 x10E3/uL   EOS (ABSOLUTE) 0.1 0.0 - 0.4 x10E3/uL   Basophils Absolute  0.1 0.0 - 0.2 x10E3/uL   Immature Granulocytes 0 Not Estab. %   Immature Grans (Abs) 0.0 0.0 - 0.1 x10E3/uL  Comprehensive metabolic panel   Collection Time: 12/22/22  8:56 AM  Result Value Ref Range   Glucose 105 (H) 70 - 99 mg/dL   BUN 11 8 - 27 mg/dL   Creatinine, Ser 7.82 0.76 - 1.27 mg/dL   eGFR 65 >95 AO/ZHY/8.65   BUN/Creatinine Ratio 10 10 - 24   Sodium 139 134 - 144 mmol/L   Potassium 4.2 3.5 - 5.2 mmol/L   Chloride 104 96 - 106 mmol/L   CO2 22 20 - 29 mmol/L   Calcium  9.3 8.6 - 10.2 mg/dL   Total Protein 6.6 6.0 - 8.5 g/dL   Albumin 4.1 3.8 - 4.8 g/dL   Globulin, Total 2.5 1.5 - 4.5 g/dL   Bilirubin Total 0.5 0.0 - 1.2 mg/dL   Alkaline Phosphatase 61 44 - 121 IU/L   AST 21 0 - 40 IU/L   ALT 26 0 - 44 IU/L  HIV Antibody (routine testing w rflx)   Collection Time: 12/22/22  8:56 AM  Result Value Ref Range   HIV Screen 4th Generation wRfx Non Reactive Non Reactive  RPR   Collection Time: 12/22/22  8:56 AM  Result Value Ref Range   RPR Ser Ql Non Reactive Non Reactive  Hepatitis C antibody   Collection Time: 12/22/22  8:56 AM  Result Value Ref Range   Hep C Virus Ab Non Reactive Non Reactive      Assessment & Plan:   Problem List Items Addressed This Visit       Cardiovascular and Mediastinum   Essential hypertension   Chronic.  Controlled.   Continue Valsartan  to 40mg  daily.  Labs ordered today.  Refills sent today.  Has seen Cardiology.  No intervention recommended at this time. Reviewed noted from visit. Return to clinic in 6 months for reevaluation.  Call sooner if concerns arise.       Relevant Medications   atorvastatin  (LIPITOR) 10 MG tablet   valsartan  (DIOVAN ) 40 MG tablet   Other Relevant Orders   Comprehensive metabolic panel with GFR   Aortic atherosclerosis (HCC)   Chronic.  Controlled.  Continue with  current medication regimen on Atorvastatin  daily.  Refills sent today.  Labs ordered today.  Return to clinic in 6 months for reevaluation.  Call sooner if concerns arise.       Relevant Medications   atorvastatin  (LIPITOR) 10 MG tablet   valsartan  (DIOVAN ) 40 MG tablet     Endocrine   Pituitary adenoma (HCC) - Primary   Under good control on current regimen. Continue current regimen. Continue to monitor. Call with any concerns. Labs drawn today.         Genitourinary   CKD (chronic kidney disease)   Chronic.  Controlled.  Continue with current medication regimen.  Need to keep blood pressures under control.  Labs ordered today.  Return to clinic in 6 months for reevaluation.  Call sooner if concerns arise.          Other   Hyperlipemia   Chronic.  Controlled.  Continue with current medication regimen on Atorvastatin  10mg  daily.  Refill sent today.  Labs ordered today.  Return to clinic in 6 months for reevaluation.  Call sooner if concerns arise.       Relevant Medications   atorvastatin  (LIPITOR) 10 MG tablet   valsartan  (DIOVAN ) 40 MG tablet  Other Relevant Orders   Lipid panel   Elevated glucose   Labs ordered at visit today.  Will make recommendations based on lab results.        Relevant Orders   Hemoglobin A1c   Other Visit Diagnoses       Screening examination for STI       Relevant Orders   HIV Antibody (routine testing w rflx)   RPR   Hepatitis C antibody   Chlamydia/Gonococcus/Trichomonas, NAA        Follow up plan: Return in about 6 months (around 12/22/2023) for Physical and Fasting labs.

## 2023-06-23 ENCOUNTER — Ambulatory Visit: Payer: Self-pay | Admitting: Nurse Practitioner

## 2023-06-23 LAB — LIPID PANEL
Chol/HDL Ratio: 2.7 ratio (ref 0.0–5.0)
Cholesterol, Total: 170 mg/dL (ref 100–199)
HDL: 64 mg/dL (ref >39)
LDL Chol Calc (NIH): 91 mg/dL (ref 0–99)
Triglycerides: 82 mg/dL (ref 0–149)
VLDL Cholesterol Cal: 15 mg/dL (ref 5–40)

## 2023-06-23 LAB — HEMOGLOBIN A1C
Est. average glucose Bld gHb Est-mCnc: 123 mg/dL
Hgb A1c MFr Bld: 5.9 % — ABNORMAL HIGH (ref 4.8–5.6)

## 2023-06-23 LAB — COMPREHENSIVE METABOLIC PANEL WITH GFR
ALT: 30 IU/L (ref 0–44)
AST: 29 IU/L (ref 0–40)
Albumin: 4.3 g/dL (ref 3.8–4.8)
Alkaline Phosphatase: 59 IU/L (ref 44–121)
BUN/Creatinine Ratio: 10 (ref 10–24)
BUN: 12 mg/dL (ref 8–27)
Bilirubin Total: 0.5 mg/dL (ref 0.0–1.2)
CO2: 18 mmol/L — ABNORMAL LOW (ref 20–29)
Calcium: 9.8 mg/dL (ref 8.6–10.2)
Chloride: 103 mmol/L (ref 96–106)
Creatinine, Ser: 1.21 mg/dL (ref 0.76–1.27)
Globulin, Total: 2.7 g/dL (ref 1.5–4.5)
Glucose: 105 mg/dL — ABNORMAL HIGH (ref 70–99)
Potassium: 4.3 mmol/L (ref 3.5–5.2)
Sodium: 140 mmol/L (ref 134–144)
Total Protein: 7 g/dL (ref 6.0–8.5)
eGFR: 61 mL/min/{1.73_m2} (ref >59)

## 2023-06-23 LAB — RPR: RPR Ser Ql: NONREACTIVE

## 2023-06-23 LAB — HIV ANTIBODY (ROUTINE TESTING W REFLEX): HIV Screen 4th Generation wRfx: NONREACTIVE

## 2023-06-23 LAB — HEPATITIS C ANTIBODY: Hep C Virus Ab: NONREACTIVE

## 2023-06-24 LAB — CHLAMYDIA/GONOCOCCUS/TRICHOMONAS, NAA
Chlamydia by NAA: NEGATIVE
Gonococcus by NAA: NEGATIVE
Trich vag by NAA: NEGATIVE

## 2023-07-04 ENCOUNTER — Encounter: Payer: Self-pay | Admitting: Nurse Practitioner

## 2023-07-05 ENCOUNTER — Other Ambulatory Visit: Payer: Self-pay

## 2023-07-05 MED ORDER — FAMOTIDINE 20 MG PO TABS: ORAL_TABLET | ORAL | 3 refills | Status: DC

## 2023-07-05 MED ORDER — BUDESONIDE-FORMOTEROL FUMARATE 80-4.5 MCG/ACT IN AERO: INHALATION_SPRAY | RESPIRATORY_TRACT | 3 refills | Status: DC

## 2023-07-05 MED ORDER — VALSARTAN 40 MG PO TABS: 40.0000 mg | ORAL_TABLET | Freq: Every day | ORAL | 1 refills | Status: DC

## 2023-07-05 MED ORDER — ATORVASTATIN CALCIUM 10 MG PO TABS: 10.0000 mg | ORAL_TABLET | Freq: Every day | ORAL | 1 refills | Status: DC

## 2023-07-05 NOTE — Telephone Encounter (Signed)
 Received mychart message to have prescriptions sent to [facility]. They were recently sent to [facility].

## 2023-08-14 ENCOUNTER — Other Ambulatory Visit: Payer: Self-pay | Admitting: Nurse Practitioner

## 2023-08-18 NOTE — Telephone Encounter (Signed)
 Too soon for refill, LRF 07/05/23 for 90 and 1 RF.  Requested Prescriptions  Pending Prescriptions Disp Refills   valsartan  (DIOVAN ) 40 MG tablet [Pharmacy Med Name: Valsartan  40 MG Oral Tablet] 100 tablet 2    Sig: TAKE 1 TABLET BY MOUTH DAILY     Cardiovascular:  Angiotensin Receptor Blockers Passed - 08/18/2023 11:16 AM      Passed - Cr in normal range and within 180 days    Creatinine, Ser  Date Value Ref Range Status  06/22/2023 1.21 0.76 - 1.27 mg/dL Final         Passed - K in normal range and within 180 days    Potassium  Date Value Ref Range Status  06/22/2023 4.3 3.5 - 5.2 mmol/L Final         Passed - Patient is not pregnant      Passed - Last BP in normal range    BP Readings from Last 1 Encounters:  06/22/23 (!) 106/53         Passed - Valid encounter within last 6 months    Recent Outpatient Visits           1 month ago Pituitary adenoma [facility])   [place] [facility] Melvin Pao, NP

## 2023-09-13 DIAGNOSIS — H6123 Impacted cerumen, bilateral: Secondary | ICD-10-CM | POA: Diagnosis not present

## 2023-09-13 DIAGNOSIS — H903 Sensorineural hearing loss, bilateral: Secondary | ICD-10-CM | POA: Diagnosis not present

## 2023-09-23 ENCOUNTER — Encounter: Payer: Self-pay | Admitting: Nurse Practitioner

## 2023-09-24 ENCOUNTER — Telehealth: Payer: Self-pay

## 2023-09-24 NOTE — Telephone Encounter (Signed)
 Copied from CRM [id number]. Topic: Clinical - Medication Question >> Sep 23, 2023 11:06 AM Emylou G wrote: Reason for CRM: Pls call: Angeline Capuchin (friend) at [phone]  Wants to know if he needs prior auth from ins for flu shot and covid shot

## 2023-09-24 NOTE — Telephone Encounter (Signed)
 Called and LVM letting Angeline know that the patient does not need prior authorization to obtain either vaccine.

## 2023-10-19 ENCOUNTER — Ambulatory Visit (INDEPENDENT_AMBULATORY_CARE_PROVIDER_SITE_OTHER): Admitting: Nurse Practitioner

## 2023-10-19 ENCOUNTER — Encounter: Payer: Self-pay | Admitting: Nurse Practitioner

## 2023-10-19 VITALS — BP 128/68 | HR 55 | Temp 98.1°F | Resp 15 | Ht 70.98 in | Wt 204.0 lb

## 2023-10-19 DIAGNOSIS — N492 Inflammatory disorders of scrotum: Secondary | ICD-10-CM | POA: Insufficient documentation

## 2023-10-19 MED ORDER — DOXYCYCLINE HYCLATE 100 MG PO TABS: 100.0000 mg | ORAL_TABLET | Freq: Two times a day (BID) | ORAL | 0 refills | Status: AC

## 2023-10-19 MED ORDER — HYDROCODONE-ACETAMINOPHEN 5-325 MG PO TABS: 1.0000 | ORAL_TABLET | Freq: Four times a day (QID) | ORAL | 0 refills | Status: AC | PRN

## 2023-10-19 NOTE — Assessment & Plan Note (Signed)
 Present for one month and not improving or draining.  Had Dr. Vicci assess area as well and consult, both discussed with patient and wife need to attend general surgery for drainage of abscess to this area due to risks with it being deeper than appears and requiring more of a specialist approach due to area it is presenting. They agree with this plan. Urgent referral to general surgery placed. Started on Doxycycline 100 MG BID for 7 days and sent in short burst of Norco 5 MG low dose to use as needed only for plan.  Discussed plan of care at length with patient. Recommend: - Warm compresses at home or epsom salt soaks - Do not manipulate or try to drain area on own - Monitor for fever and if presents be seen immediately.

## 2023-10-19 NOTE — Patient Instructions (Signed)
 Skin Abscess    A skin abscess is an infected spot of skin. It can have pus in it. An abscess can happen in any part of your body.  Some abscesses break open (rupture) on their own. Most keep getting worse unless they are treated. If your abscess is not treated, the infection can spread deeper into your body and blood. This can make you feel sick.  What are the causes?  Germs that enter your skin. This may happen if you have:  A cut or scrape.  A wound from a needle or an insect bite.  Blocked oil or sweat glands.  A problem with the spot where your hair goes into your skin.  A fluid-filled sac called a cyst under your skin.  What increases the risk?  Having problems with how your blood moves through your body.  Having a weak body defense system (immune system).  Having diabetes.  Having dry and irritated skin.  Needing to get shots often.  Putting drugs into your body with a needle.  Having a splinter or something else in your skin.  Smoking.  What are the signs or symptoms?  A firm bump under your skin that hurts.  A bump with pus at the top.  Redness and swelling.  Warm or tender spots.  A sore on the skin.  How is this treated?  You may need to:  Put a heat pack or a warm, wet washcloth on the spot.  Have the pus drained.  Take antibiotics.  Follow these instructions at home:  Medicines  Take over-the-counter and prescription medicines only as told by your doctor.  If you were prescribed antibiotics, take them as told by your doctor. Do not stop taking them even if you start to feel better.  Abscess care    If you have an abscess that has not drained, put heat on it. Use the heat source that your doctor recommends, such as a moist heat pack or a heating pad.  Place a towel between your skin and the heat source.  Leave the heat on for 20-30 minutes.  If your skin turns bright red, take off the heat right away to prevent burns. The risk of burns is higher if you cannot feel pain, heat, or cold.  Follow  instructions from your doctor about how to take care of your abscess. Make sure you:  Cover the abscess with a bandage.  Wash your hands with soap and water for at least 20 seconds before and after you change your bandage. If you cannot use soap and water, use hand sanitizer.  Change your bandage as told by your doctor.  Check your abscess every day for signs that the infection is getting worse. Check for:  More redness, swelling, or pain.  More fluid or blood.  Warmth.  More pus or a worse smell.  General instructions  To keep the infection from spreading:  Do not share personal items or towels.  Do not go in a hot tub with others.  Avoid making skin contact with others.  Be careful when you get rid of used bandages or any pus from the abscess.  Do not smoke or use any products that contain nicotine or tobacco. If you need help quitting, ask your doctor.  Contact a doctor if:  You see red streaks on your skin near the abscess.  You have any signs of worse infection.  You vomit every time you eat or drink.  You have  a fever, chills, or muscle aches.  The cyst or abscess comes back.  Get help right away if:  You have very bad pain.  You make less pee (urine) than normal.  This information is not intended to replace advice given to you by your health care provider. Make sure you discuss any questions you have with your health care provider.  Document Revised: 08/06/2021 Document Reviewed: 08/06/2021  Elsevier Patient Education  2024 [employer].

## 2023-10-19 NOTE — Progress Notes (Signed)
 BP 128/68 (BP Location: Left Arm, Patient Position: Sitting, Cuff Size: Normal)   Pulse (!) 55   Temp 98.1 F (36.7 C) (Oral)   Resp 15   Ht 5' 10.98 (1.803 m)   Wt 204 lb (92.5 kg)   SpO2 98%   BMI 28.47 kg/m    Subjective:    Patient ID: Matthew Randolph, male    DOB: July 29, 1940, 83 y.o.   MRN: [id number]  HPI: Matthew Randolph is a 83 y.o. male  Chief Complaint  Patient presents with   Abscess    Started about a month ago- Using peroxide and washes when using restroom. Near left testicle. No draining but would like it to be drained.    SKIN INFECTION Started about one month ago.  Had similar as a child, but none since then.  When a child it was on inner thigh though. Would like it drained. Duration: weeks Location: near left testicle History of trauma in area: no Pain: yes Quality: yes Severity: 8/10 Redness: yes Swelling: yes Oozing: no Pus: no Fevers: no Nausea/vomiting: no Status: fluctuating Treatments attempted:warm compresses  Tetanus: UTD   Relevant past medical, surgical, family and social history reviewed and updated as indicated. Interim medical history since our last visit reviewed. Allergies and medications reviewed and updated.  Review of Systems  Constitutional:  Negative for activity change, diaphoresis, fatigue and fever.  Respiratory:  Negative for cough, chest tightness, shortness of breath and wheezing.   Cardiovascular:  Negative for chest pain, palpitations and leg swelling.  Gastrointestinal: Negative.   Neurological: Negative.   Psychiatric/Behavioral: Negative.      Per HPI unless specifically indicated above     Objective:    BP 128/68 (BP Location: Left Arm, Patient Position: Sitting, Cuff Size: Normal)   Pulse (!) 55   Temp 98.1 F (36.7 C) (Oral)   Resp 15   Ht 5' 10.98 (1.803 m)   Wt 204 lb (92.5 kg)   SpO2 98%   BMI 28.47 kg/m   Wt Readings from Last 3 Encounters:  10/19/23 204 lb (92.5 kg)  06/22/23 208 lb 12.8  oz (94.7 kg)  12/22/22 209 lb 3.2 oz (94.9 kg)    Physical Exam Vitals and nursing note reviewed.  Constitutional:      General: He is awake. He is not in acute distress.    Appearance: He is well-developed and well-groomed. He is not ill-appearing or toxic-appearing.  HENT:     Head: Normocephalic.     Right Ear: Hearing and external ear normal.     Left Ear: Hearing and external ear normal.  Eyes:     General: Lids are normal.     Extraocular Movements: Extraocular movements intact.     Conjunctiva/sclera: Conjunctivae normal.  Neck:     Thyroid : No thyromegaly.     Vascular: No carotid bruit.  Cardiovascular:     Rate and Rhythm: Normal rate and regular rhythm.     Heart sounds: Normal heart sounds. No murmur heard.    No gallop.  Pulmonary:     Effort: No accessory muscle usage or respiratory distress.     Breath sounds: Normal breath sounds. No decreased breath sounds, wheezing or rales.  Abdominal:     General: Bowel sounds are normal. There is no distension.     Palpations: Abdomen is soft.     Tenderness: There is no abdominal tenderness.  Musculoskeletal:     Cervical back: Full passive range of motion  without pain.     Right lower leg: No edema.     Left lower leg: No edema.  Lymphadenopathy:     Cervical: No cervical adenopathy.  Skin:    General: Skin is warm.     Capillary Refill: Capillary refill takes less than 2 seconds.     Findings: Abscess present.      Neurological:     Mental Status: He is alert and oriented to person, place, and time.     Deep Tendon Reflexes: Reflexes are normal and symmetric.     Reflex Scores:      Brachioradialis reflexes are 2+ on the right side and 2+ on the left side.      Patellar reflexes are 2+ on the right side and 2+ on the left side. Psychiatric:        Attention and Perception: Attention normal.        Mood and Affect: Mood normal.        Speech: Speech normal.        Behavior: Behavior normal. Behavior is  cooperative.        Thought Content: Thought content normal.     Results for orders placed or performed in visit on 06/22/23  Chlamydia/Gonococcus/Trichomonas, NAA   Collection Time: 06/22/23  8:34 AM   Specimen: Urine   UR  Result Value Ref Range   Chlamydia by NAA Negative Negative   Gonococcus by NAA Negative Negative   Trich vag by NAA Negative Negative  Comprehensive metabolic panel with GFR   Collection Time: 06/22/23  8:45 AM  Result Value Ref Range   Glucose 105 (H) 70 - 99 mg/dL   BUN 12 8 - 27 mg/dL   Creatinine, Ser 8.78 0.76 - 1.27 mg/dL   eGFR 61 >40 fO/fpw/8.26   BUN/Creatinine Ratio 10 10 - 24   Sodium 140 134 - 144 mmol/L   Potassium 4.3 3.5 - 5.2 mmol/L   Chloride 103 96 - 106 mmol/L   CO2 18 (L) 20 - 29 mmol/L   Calcium  9.8 8.6 - 10.2 mg/dL   Total Protein 7.0 6.0 - 8.5 g/dL   Albumin 4.3 3.8 - 4.8 g/dL   Globulin, Total 2.7 1.5 - 4.5 g/dL   Bilirubin Total 0.5 0.0 - 1.2 mg/dL   Alkaline Phosphatase 59 44 - 121 IU/L   AST 29 0 - 40 IU/L   ALT 30 0 - 44 IU/L  Lipid panel   Collection Time: 06/22/23  8:45 AM  Result Value Ref Range   Cholesterol, Total 170 100 - 199 mg/dL   Triglycerides 82 0 - 149 mg/dL   HDL 64 >60 mg/dL   VLDL Cholesterol Cal 15 5 - 40 mg/dL   LDL Chol Calc (NIH) 91 0 - 99 mg/dL   Chol/HDL Ratio 2.7 0.0 - 5.0 ratio  Hemoglobin A1c   Collection Time: 06/22/23  8:45 AM  Result Value Ref Range   Hgb A1c MFr Bld 5.9 (H) 4.8 - 5.6 %   Est. average glucose Bld gHb Est-mCnc 123 mg/dL  HIV Antibody (routine testing w rflx)   Collection Time: 06/22/23  8:45 AM  Result Value Ref Range   HIV Screen 4th Generation wRfx Non Reactive Non Reactive  RPR   Collection Time: 06/22/23  8:45 AM  Result Value Ref Range   RPR Ser Ql Non Reactive Non Reactive   Interpretation: Comment   Hepatitis C antibody   Collection Time: 06/22/23  8:45 AM  Result Value Ref  Range   Hep C Virus Ab Non Reactive Non Reactive      Assessment & Plan:    Problem List Items Addressed This Visit       Genitourinary   Scrotal abscess - Primary   Present for one month and not improving or draining.  Had Dr. Vicci assess area as well and consult, both discussed with patient and wife need to attend general surgery for drainage of abscess to this area due to risks with it being deeper than appears and requiring more of a specialist approach due to area it is presenting. They agree with this plan. Urgent referral to general surgery placed. Started on Doxycycline 100 MG BID for 7 days and sent in short burst of Norco 5 MG low dose to use as needed only for plan.  Discussed plan of care at length with patient. Recommend: - Warm compresses at home or epsom salt soaks - Do not manipulate or try to drain area on own - Monitor for fever and if presents be seen immediately.      Relevant Orders   Ambulatory referral to General Surgery     Follow up plan: Return if symptoms worsen or fail to improve.

## 2023-10-21 ENCOUNTER — Encounter: Payer: Self-pay | Admitting: General Surgery

## 2023-10-21 ENCOUNTER — Ambulatory Visit (INDEPENDENT_AMBULATORY_CARE_PROVIDER_SITE_OTHER): Admitting: General Surgery

## 2023-10-21 VITALS — BP 123/60 | HR 56 | Ht 70.98 in | Wt 203.0 lb

## 2023-10-21 DIAGNOSIS — L02215 Cutaneous abscess of perineum: Secondary | ICD-10-CM | POA: Diagnosis not present

## 2023-10-21 NOTE — Patient Instructions (Addendum)
 Pack the area once a day until Tuesday. Then just keep the area covered with a clean dry gauze and tape in place.  Today we have drained your Abscess in the office. The numbing medication will wear off in approximately 4-8 hours. You will have some pain to the area afterwards but should not be as severe as prior to the procedure.  If you have been given antibiotics, please continue to take them after your procedure.  You may take 2 extra strength Tylenol , or 3 regular Ibuprofen tablets every 6 hours as needed for pain and discomfort.  You may shower. First remove all of the packing and wash letting the warm soapy water run over the area, rinse well, and pat dry.    We will see you back as scheduled below.   If you have any questions or concerns prior to your appointment, please call our office and speak with a nurse.  Incision and Drainage Incision and drainage is a surgical procedure to open and drain a fluid-filled sac. The sac may be filled with pus, mucus, or blood. Examples of fluid-filled sacs that may need surgical drainage include cysts, skin infections (abscesses), and red lumps that develop from a ruptured cyst or a small abscess (boils). You may need this procedure if the affected area is large, painful, infected, or not healing well. Tell a health care provider about: Any allergies you have. All medicines you are taking, including vitamins, herbs, eye drops, creams, and over-the-counter medicines. Any problems you or family members have had with anesthetic medicines. Any blood disorders you have. Any surgeries you have had. Any medical conditions you have. Whether you are pregnant or may be pregnant. What are the risks? Generally, this is a safe procedure. However, problems may occur, including: Infection. Bleeding. Allergic reactions to medicines. Scarring.  What happens before the procedure? You may need an ultrasound or other imaging tests to see how large or deep the  fluid-filled sac is. You may have blood tests to check for infection. You may get a tetanus shot. You may be given antibiotic medicine to help prevent infection. Follow instructions from your health care provider about eating or drinking restrictions. Ask your health care provider about: Changing or stopping your regular medicines. This is especially important if you are taking diabetes medicines or blood thinners. Taking medicines such as aspirin  and ibuprofen. These medicines can thin your blood. Do not take these medicines before your procedure if your health care provider instructs you not to. Plan to have someone take you home after the procedure. If you will be going home right after the procedure, plan to have someone stay with you for 24 hours. What happens during the procedure? To reduce your risk of infection: Your health care team will wash or sanitize their hands. Your skin will be washed with soap. You will be given one or more of the following: A medicine to help you relax (sedative). A medicine to numb the area (local anesthetic). A medicine to make you fall asleep (general anesthetic). An incision will be made in the top of the fluid-filled sac. The contents of the sac may be squeezed out, or a syringe or tube (catheter) may be used to empty the sac. The catheter may be left in place for several weeks to drain any fluid. Or, your health care provider may stitch open the edges of the incision to make a long-term opening for drainage (marsupialization). The inside of the sac may be washed out (  irrigated) with a sterile solution and packed with gauze before it is covered with a bandage (dressing). The procedure may vary among health care providers and hospitals. What happens after the procedure? Your blood pressure, heart rate, breathing rate, and blood oxygen level will be monitored often until the medicines you were given have worn off. Do not drive for 24 hours if you  received a sedative. This information is not intended to replace advice given to you by your health care provider. Make sure you discuss any questions you have with your health care provider. Document Released: 06/17/2000 Document Revised: 05/30/2015 Document Reviewed: 10/12/2014 Elsevier Interactive Patient Education  2017 [employer]   Incision and Drainage, Care After Refer to this sheet in the next few weeks. These instructions provide you with information about caring for yourself after your procedure. Your health care provider may also give you more specific instructions. Your treatment has been planned according to current medical practices, but problems sometimes occur. Call your health care provider if you have any problems or questions after your procedure. What can I expect after the procedure? After the procedure, it is common to have: Pain or discomfort around your incision site. Drainage from your incision.  Follow these instructions at home: Take over-the-counter and prescription medicines only as told by your health care provider. If you were prescribed an antibiotic medicine, take it as told by your health care provider. Do not stop taking the antibiotic even if you start to feel better. Follow instructions from your health care provider about: How to take care of your incision. When and how you should change your packing and bandage (dressing). Wash your hands with soap and water before you change your dressing. If soap and water are not available, use hand sanitizer. When you should remove your dressing. Do not take baths, swim, or use a hot tub until your health care provider approves. Keep all follow-up visits as told by your health care provider. This is important. Check your incision area every day for signs of infection. Check for: More redness, swelling, or pain. More fluid or blood. Warmth. Pus or a bad smell. Contact a health care provider if: Your cyst or  abscess returns. You have a fever. You have more redness, swelling, or pain around your incision. You have more fluid or blood coming from your incision. Your incision feels warm to the touch. You have pus or a bad smell coming from your incision. Get help right away if: You have severe pain or bleeding. You cannot eat or drink without vomiting. You have decreased urine output. You become short of breath. You have chest pain. You cough up blood. The area where the incision and drainage occurred becomes numb or it tingles. This information is not intended to replace advice given to you by your health care provider. Make sure you discuss any questions you have with your health care provider. Document Released: 03/16/2011 Document Revised: 05/24/2015 Document Reviewed: 10/12/2014 Elsevier Interactive Patient Education  2017 [employer].

## 2023-10-21 NOTE — Progress Notes (Signed)
 Patient ID: Matthew Randolph, male   DOB: 03/23/1940, 83 y.o.   MRN: [id number] CC: Perineal Abscess History of Present Illness Matthew Randolph is a 83 y.o. male with past medical history as below who presents in consultation for perineal abscess.  The patient reports that approximately a month ago he noticed a bump on his perineum.  He has been using alcohol and peroxide washes to help with this.  He says that it has continued to grow.  He says that today it started to drain a small amount of purulent fluid.  He has pain with this.  He denies any systemic symptoms including no nausea or vomiting and fevers.  He went to his primary care doctor and was prescribed doxycycline.  Patient is accompanied by his wife who provides some of the history.  Past Medical History Past Medical History:  Diagnosis Date   Arthritis    Benign tumor of pituitary gland (HCC)    Chronic kidney disease    ED (erectile dysfunction)    Erectile dysfunction    GERD (gastroesophageal reflux disease)    High risk sexual behavior    Hyperlipidemia    Hypertension    Pneumonia 2012   Tobacco use disorder, continuous        Past Surgical History:  Procedure Laterality Date   COLONOSCOPY  2015   DENTAL SURGERY  2024   LIPOMA EXCISION Left 10/04/2018   Procedure: EXCISION LIPOMA LEFT HIP;  Surgeon: Kathlynn Sharper, MD;  Location: [facility];  Service: Orthopedics;  Laterality: Left;   PITUITARY SURGERY  12/10/2015   TOTAL HIP ARTHROPLASTY Right 09/09/2017   Procedure: TOTAL HIP ARTHROPLASTY ANTERIOR APPROACH;  Surgeon: Kathlynn Sharper, MD;  Location: [facility];  Service: Orthopedics;  Laterality: Right;   TOTAL HIP ARTHROPLASTY Left 10/04/2018   Procedure: TOTAL HIP ARTHROPLASTY ANTERIOR APPROACH;  Surgeon: Kathlynn Sharper, MD;  Location: [facility];  Service: Orthopedics;  Laterality: Left;    No Known Allergies  Current Outpatient Medications  Medication Sig Dispense Refill   acetaminophen  (TYLENOL ) 500 MG tablet Take  500 mg by mouth every 6 (six) hours as needed.     albuterol  (VENTOLIN  HFA) 108 (90 Base) MCG/ACT inhaler Inhale 2 puffs into the lungs every 6 (six) hours as needed for wheezing or shortness of breath. 54 g 3   atorvastatin  (LIPITOR) 10 MG tablet Take 1 tablet (10 mg total) by mouth daily. 90 tablet 1   budesonide -formoterol  (SYMBICORT ) 80-4.5 MCG/ACT inhaler Take 2 puffs first thing in am and then another 2 puffs about 12 hours later. 3 each 3   doxycycline (VIBRA-TABS) 100 MG tablet Take 1 tablet (100 mg total) by mouth 2 (two) times daily for 7 days. 14 tablet 0   famotidine  (PEPCID ) 20 MG tablet One after supper 90 tablet 3   fluticasone  (FLONASE ) 50 MCG/ACT nasal spray Place 2 sprays into both nostrils daily. 48 g 3   HYDROcodone -acetaminophen  (NORCO/VICODIN) 5-325 MG tablet Take 1 tablet by mouth every 6 (six) hours as needed for up to 5 days for moderate pain (pain score 4-6). 20 tablet 0   meloxicam  (MOBIC ) 15 MG tablet Take 1 tablet (15 mg total) by mouth daily. 30 tablet 1   Menthol , Topical Analgesic, (ICY HOT EX) Apply 1 application topically daily as needed (pain).     Multiple Vitamin (MULTIVITAMIN) tablet Take 1 tablet by mouth daily.     valsartan  (DIOVAN ) 40 MG tablet Take 1 tablet (40 mg total) by mouth daily. 90  tablet 1   No current facility-administered medications for this visit.    Family History Family History  Problem Relation Age of Onset   Brain cancer Mother    Cancer Mother        Brain tumor   Thyroid  disease Mother    Lung disease Mother        From [place]   Heart disease Father    Stroke Father    Hypertension Brother    Aneurysm Brother    Pneumonia Brother    Heart disease Sister        massive MI   Obesity Sister        Social History Social History   Tobacco Use   Smoking status: Every Day    Current packs/day: 0.25    Average packs/day: 0.3 packs/day for 40.0 years (10.0 ttl pk-yrs)    Types: Cigarettes    Passive exposure: Past    Smokeless tobacco: Never   Tobacco comments:    2-3 cig daily 10/29/2020  Vaping Use   Vaping status: Never Used  Substance Use Topics   Alcohol use: Not Currently   Drug use: No        ROS Full ROS of systems performed and is otherwise negative there than what is stated in the HPI  Physical Exam Blood pressure 123/60, pulse (!) 56, height 5' 10.98 (1.803 m), weight 203 lb (92.1 kg), SpO2 98%.  Alert and oriented x 3, no work of breathing room air, regular rate and rhythm, abdomen soft, nontender nondistended, perineal exam performed in the presence of a chaperone.  This perineum he has an area of erythema and fluctuance consistent with abscess.  Upon palpation of this there is a punctate opening that drains pus. Data Reviewed Last labs reviewed and significant for a normal creatinine at 1.21 his last A1c was 5.9.  I have personally reviewed the patient's imaging and medical records.    Assessment    Patient with perineal abscess.  This does have a punctate opening that is draining pus but it is not going to drain enough to relieve fully the abscess.  Plan    Will plan for I&D of abscess on his perineum.  Continue doxycycline course.  We will plan to see him again in 2 weeks.  A total of 45 minutes was spent reviewing the patient's chart, performing history and physical and discussing treatment options with the patient.  This is irrespective of the time spent on procedure   After informed consent was obtained the patient was placed prone on our procedure room table.  His perineum was then prepped with alcohol swabs.  A 11 blade was made to incise the abscess at the most fluctuant point.  This was taken down into the abscess cavity with immediate expulsion of approximately 5 cc of pus.  The wound Was milked of further pus.  It was probed with a Q-tip and all loculations were broken up.  It was then packed with iodoform gauze.    Matthew Randolph Endow 10/21/2023, 4:37 PM

## 2023-10-22 NOTE — Progress Notes (Unsigned)
   10/22/2023  Patient ID: Matthew Randolph, male   DOB: February 02, 1940, 83 y.o.   MRN: [id number]  This patient is appearing on a report for being at risk of failing the adherence measure for cholesterol (statin) medications this calendar year.   Medication: Atorvastatin  10 mg tablets Last fill date: 05/30/23 for 80 day supply  MyChart message sent to patient.   [name] C. Jorrell Kuster [facility] PharmD Candidate Class of [phone]

## 2023-10-30 ENCOUNTER — Other Ambulatory Visit: Payer: Self-pay | Admitting: Nurse Practitioner

## 2023-11-02 NOTE — Telephone Encounter (Signed)
 Rx 07/05/23 #90 1RF- too soon Requested Prescriptions  Pending Prescriptions Disp Refills   valsartan  (DIOVAN ) 40 MG tablet [Pharmacy Med Name: Valsartan  40 MG Oral Tablet] 80 tablet 3    Sig: TAKE 1 TABLET BY MOUTH DAILY     Cardiovascular:  Angiotensin Receptor Blockers Passed - 11/02/2023  9:38 AM      Passed - Cr in normal range and within 180 days    Creatinine, Ser  Date Value Ref Range Status  06/22/2023 1.21 0.76 - 1.27 mg/dL Final         Passed - K in normal range and within 180 days    Potassium  Date Value Ref Range Status  06/22/2023 4.3 3.5 - 5.2 mmol/L Final         Passed - Patient is not pregnant      Passed - Last BP in normal range    BP Readings from Last 1 Encounters:  10/21/23 123/60         Passed - Valid encounter within last 6 months    Recent Outpatient Visits           2 weeks ago Scrotal abscess   [place] [facility] [place], Melanie T, NP   4 months ago Pituitary adenoma [facility])   [place] [facility] Melvin Pao, NP

## 2023-11-04 ENCOUNTER — Ambulatory Visit: Admitting: General Surgery

## 2023-11-04 ENCOUNTER — Encounter: Payer: Self-pay | Admitting: General Surgery

## 2023-11-04 VITALS — BP 128/72 | HR 56 | Ht 70.98 in | Wt 205.0 lb

## 2023-11-04 DIAGNOSIS — L02215 Cutaneous abscess of perineum: Secondary | ICD-10-CM | POA: Diagnosis not present

## 2023-11-04 DIAGNOSIS — Z09 Encounter for follow-up examination after completed treatment for conditions other than malignant neoplasm: Secondary | ICD-10-CM

## 2023-11-04 NOTE — Progress Notes (Signed)
 Outpatient Surgical Follow Up  11/04/2023  Matthew Randolph is an 83 y.o. male.   Chief Complaint  Patient presents with   Follow-up    HPI: The patient returns today status post incision and drainage of perineal abscess.  He reports doing well.  He has completed antibiotics.  He says that he has no further pain or drainage from the area.  His wife packed it for several days after the incision and drainage and is now completely healed over.  He denies any systemic symptoms including no fevers or chills  Past Medical History:  Diagnosis Date   Arthritis    Benign tumor of pituitary gland (HCC)    Chronic kidney disease    ED (erectile dysfunction)    Erectile dysfunction    GERD (gastroesophageal reflux disease)    High risk sexual behavior    Hyperlipidemia    Hypertension    Pneumonia 2012   Tobacco use disorder, continuous     Past Surgical History:  Procedure Laterality Date   COLONOSCOPY  2015   DENTAL SURGERY  2024   LIPOMA EXCISION Left 10/04/2018   Procedure: EXCISION LIPOMA LEFT HIP;  Surgeon: Kathlynn Sharper, MD;  Location: [facility];  Service: Orthopedics;  Laterality: Left;   PITUITARY SURGERY  12/10/2015   TOTAL HIP ARTHROPLASTY Right 09/09/2017   Procedure: TOTAL HIP ARTHROPLASTY ANTERIOR APPROACH;  Surgeon: Kathlynn Sharper, MD;  Location: [facility];  Service: Orthopedics;  Laterality: Right;   TOTAL HIP ARTHROPLASTY Left 10/04/2018   Procedure: TOTAL HIP ARTHROPLASTY ANTERIOR APPROACH;  Surgeon: Kathlynn Sharper, MD;  Location: [facility] OR[facility];  Service: Orthopedics;  Laterality: Left;    Family History  Problem Relation Age of Onset   Brain cancer Mother    Cancer Mother        Brain tumor   Thyroid  disease Mother    Lung disease Mother        From [place]   Heart disease Father    Stroke Father    Hypertension Brother    Aneurysm Brother    Pneumonia Brother    Heart disease Sister        massive MI   Obesity Sister     Social History:  reports that he has been  smoking cigarettes. He has a 10 pack-year smoking history. He has been exposed to tobacco smoke. He has never used smokeless tobacco. He reports that he does not currently use alcohol. He reports that he does not use drugs.  Allergies: No Known Allergies  Medications reviewed.    ROS Full ROS performed and is otherwise negative other than what is stated in HPI   BP 128/72   Pulse (!) 56   Ht 5' 10.98 (1.803 m)   Wt 205 lb (93 kg)   SpO2 95%   BMI 28.61 kg/m   Physical Exam  Perineal exam performed in the presence of a chaperone.  At the site of incision and drainage there is no fluctuance or erythema.  There is some induration but no evidence of recurrence of abscess.   No results found for this or any previous visit (from the past 48 hours). No results found.  Assessment/Plan:  Patient status post I&D of perineal abscess.  Doing well.  Completed antibiotics.  Can follow-up as needed   Jayson Endow, M.D.  [facility]

## 2023-11-04 NOTE — Patient Instructions (Signed)
   Follow-up with our office as needed.  Please call and ask to speak with a nurse if you develop questions or concerns.

## 2023-12-27 ENCOUNTER — Ambulatory Visit: Admitting: Nurse Practitioner

## 2023-12-27 ENCOUNTER — Encounter: Payer: Self-pay | Admitting: Nurse Practitioner

## 2023-12-27 VITALS — BP 114/62 | HR 36 | Temp 97.5°F | Ht 71.0 in | Wt 206.8 lb

## 2023-12-27 DIAGNOSIS — R001 Bradycardia, unspecified: Secondary | ICD-10-CM

## 2023-12-27 DIAGNOSIS — Z113 Encounter for screening for infections with a predominantly sexual mode of transmission: Secondary | ICD-10-CM | POA: Diagnosis not present

## 2023-12-27 DIAGNOSIS — R7309 Other abnormal glucose: Secondary | ICD-10-CM

## 2023-12-27 DIAGNOSIS — I10 Essential (primary) hypertension: Secondary | ICD-10-CM

## 2023-12-27 DIAGNOSIS — Z0000 Encounter for general adult medical examination without abnormal findings: Secondary | ICD-10-CM

## 2023-12-27 DIAGNOSIS — J4489 Other specified chronic obstructive pulmonary disease: Secondary | ICD-10-CM

## 2023-12-27 DIAGNOSIS — D352 Benign neoplasm of pituitary gland: Secondary | ICD-10-CM

## 2023-12-27 DIAGNOSIS — N1831 Chronic kidney disease, stage 3a: Secondary | ICD-10-CM

## 2023-12-27 DIAGNOSIS — I700 Atherosclerosis of aorta: Secondary | ICD-10-CM

## 2023-12-27 DIAGNOSIS — E782 Mixed hyperlipidemia: Secondary | ICD-10-CM

## 2023-12-27 LAB — MICROALBUMIN, URINE WAIVED
Creatinine, Urine Waived: 300 mg/dL (ref 10–300)
Microalb, Ur Waived: 30 mg/L — ABNORMAL HIGH (ref 0–19)
Microalb/Creat Ratio: <30 mg/g

## 2023-12-27 MED ORDER — BUDESONIDE-FORMOTEROL FUMARATE 80-4.5 MCG/ACT IN AERO: INHALATION_SPRAY | RESPIRATORY_TRACT | 3 refills | Status: AC

## 2023-12-27 MED ORDER — VALSARTAN 40 MG PO TABS: 40.0000 mg | ORAL_TABLET | Freq: Every day | ORAL | 1 refills | Status: AC

## 2023-12-27 MED ORDER — ATORVASTATIN CALCIUM 10 MG PO TABS: 10.0000 mg | ORAL_TABLET | Freq: Every day | ORAL | 1 refills | Status: DC

## 2023-12-27 MED ORDER — FAMOTIDINE 20 MG PO TABS: ORAL_TABLET | ORAL | 3 refills | Status: AC

## 2023-12-27 NOTE — Progress Notes (Addendum)
 "  BP 114/62   Pulse (!) 36   Temp (!) 97.5 F (36.4 C) (Oral)   Ht 5' 11 (1.803 m)   Wt 206 lb 12.8 oz (93.8 kg)   SpO2 99%   BMI 28.84 kg/m    Subjective:    Patient ID: Matthew Randolph, male    DOB: 01-17-1940, 83 y.o.   MRN: [id number]  HPI: Matthew Randolph is a 83 y.o. male presenting on 12/27/2023 for comprehensive medical examination. Current medical complaints include:bradycardia  He currently lives with: Interim Problems from his last visit: no  HYPERTENSION / HYPERLIPIDEMIA Doing well. Was seen by Cardiology for Bradycardia but asymptomatic so no intervention was recommended. Satisfied with current treatment? yes Duration of hypertension: years BP monitoring frequency: rarely BP range: not sure BP medication side effects: no Past BP meds: valsartan  Duration of hyperlipidemia: years Cholesterol medication side effects: no Cholesterol supplements: none Past cholesterol medications: atorvastain (lipitor) Medication compliance: excellent compliance Aspirin : no Recent stressors: no Recurrent headaches: no Visual changes: no Palpitations: no Dyspnea: no Chest pain: no Lower extremity edema: no Dizzy/lightheaded: no  CHRONIC KIDNEY DISEASE CKD status: controlled Medications renally dose: yes Previous renal evaluation: no Pneumovax:  Up to Date Influenza Vaccine:  Up to Date   Depression Screen done today and results listed below:     12/27/2023    8:30 AM 10/19/2023    4:01 PM 06/22/2023    8:02 AM 12/22/2022    8:30 AM 06/19/2022    8:22 AM  Depression screen PHQ 2/9  Decreased Interest 0 0 0 0 3  Down, Depressed, Hopeless 0 0 0 0 0  PHQ - 2 Score 0 0 0 0 3  Altered sleeping  0 0 0 0  Tired, decreased energy  0 0 0 0  Change in appetite  0 0 0 0  Feeling bad or failure about yourself   0 0 0 0  Trouble concentrating  0 0 0 0  Moving slowly or fidgety/restless  0 0 0 0  Suicidal thoughts  0 0 0 0  PHQ-9 Score  0  0  0  3   Difficult doing  work/chores     Not difficult at all     Data saved with a previous flowsheet row definition    The patient does not have a history of falls. I did complete a risk assessment for falls. A plan of care for falls was documented.   Past Medical History:  Past Medical History:  Diagnosis Date   Arthritis    Benign tumor of pituitary gland (HCC)    Chronic kidney disease    ED (erectile dysfunction)    Erectile dysfunction    GERD (gastroesophageal reflux disease)    High risk sexual behavior    Hyperlipidemia    Hypertension    Pneumonia 2012   Tobacco use disorder, continuous     Surgical History:  Past Surgical History:  Procedure Laterality Date   COLONOSCOPY  2015   DENTAL SURGERY  2024   LIPOMA EXCISION Left 10/04/2018   Procedure: EXCISION LIPOMA LEFT HIP;  Surgeon: Kathlynn Sharper, MD;  Location: [facility];  Service: Orthopedics;  Laterality: Left;   PITUITARY SURGERY  12/10/2015   TOTAL HIP ARTHROPLASTY Right 09/09/2017   Procedure: TOTAL HIP ARTHROPLASTY ANTERIOR APPROACH;  Surgeon: Kathlynn Sharper, MD;  Location: [facility];  Service: Orthopedics;  Laterality: Right;   TOTAL HIP ARTHROPLASTY Left 10/04/2018   Procedure: TOTAL HIP ARTHROPLASTY ANTERIOR APPROACH;  Surgeon: Kathlynn Sharper, MD;  Location: [facility];  Service: Orthopedics;  Laterality: Left;    Medications:  Current Outpatient Medications on File Prior to Visit  Medication Sig   acetaminophen  (TYLENOL ) 500 MG tablet Take 500 mg by mouth every 6 (six) hours as needed.   albuterol  (VENTOLIN  HFA) 108 (90 Base) MCG/ACT inhaler Inhale 2 puffs into the lungs every 6 (six) hours as needed for wheezing or shortness of breath.   fluticasone  (FLONASE ) 50 MCG/ACT nasal spray Place 2 sprays into both nostrils daily.   meloxicam  (MOBIC ) 15 MG tablet Take 1 tablet (15 mg total) by mouth daily.   Menthol , Topical Analgesic, (ICY HOT EX) Apply 1 application topically daily as needed (pain).   Multiple Vitamin (MULTIVITAMIN)  tablet Take 1 tablet by mouth daily.   No current facility-administered medications on file prior to visit.    Allergies:  Allergies[1]  Social History:  Social History   Socioeconomic History   Marital status: Divorced    Spouse name: Not on file   Number of children: 4   Years of education: 12th   Highest education level: 12th grade  Occupational History   Occupation: Retired Systems Developer  Tobacco Use   Smoking status: Every Day    Current packs/day: 0.25    Average packs/day: 0.3 packs/day for 40.0 years (10.0 ttl pk-yrs)    Types: Cigarettes    Passive exposure: Past   Smokeless tobacco: Never   Tobacco comments:    2-3 cig daily 10/29/2020  Vaping Use   Vaping status: Never Used  Substance and Sexual Activity   Alcohol use: Not Currently   Drug use: No   Sexual activity: Yes    Partners: Female  Other Topics Concern   Not on file  Social History Narrative   ** Merged History Encounter **       Social Drivers of Health   Tobacco Use: High Risk (12/27/2023)   Patient History    Smoking Tobacco Use: Every Day    Smokeless Tobacco Use: Never    Passive Exposure: Past  Financial Resource Strain: Low Risk  (11/12/2023)   Received from [facility]   Overall Financial Resource Strain (CARDIA)    Difficulty of Paying Living Expenses: Not hard at all  Food Insecurity: No Food Insecurity (12/27/2023)   Epic    Worried About Running Out of Food in the Last Year: Never true    Ran Out of Food in the Last Year: Never true  Transportation Needs: No Transportation Needs (12/27/2023)   Epic    Lack of Transportation (Medical): No    Lack of Transportation (Non-Medical): No  Physical Activity: Insufficiently Active (12/27/2023)   Exercise Vital Sign    Days of Exercise per Week: 1 day    Minutes of Exercise per Session: 60 min  Stress: No Stress Concern Present (12/27/2023)   [employer] of Occupational Health - Occupational Stress  Questionnaire    Feeling of Stress: Not at all  Social Connections: Moderately Integrated (12/27/2023)   Social Connection and Isolation Panel    Frequency of Communication with Friends and Family: More than three times a week    Frequency of Social Gatherings with Friends and Family: Twice a week    Attends Religious Services: More than 4 times per year    Active Member of [employer] or Organizations: No    Attends Banker Meetings: Never    Marital Status: Living with partner  Intimate Partner Violence:  Not At Risk (12/27/2023)   Epic    Fear of Current or Ex-Partner: No    Emotionally Abused: No    Physically Abused: No    Sexually Abused: No  Depression (PHQ2-9): Low Risk (12/27/2023)   Depression (PHQ2-9)    PHQ-2 Score: 0  Alcohol Screen: Low Risk (06/16/2022)   Alcohol Screen    Last Alcohol Screening Score (AUDIT): 2  Housing: Unknown (12/27/2023)   Epic    Unable to Pay for Housing in the Last Year: No    Number of Times Moved in the Last Year: Not on file    Homeless in the Last Year: No  Utilities: Not At Risk (12/27/2023)   Epic    Threatened with loss of utilities: No  Health Literacy: Adequate Health Literacy (12/27/2023)   B1300 Health Literacy    Frequency of need for help with medical instructions: Never   Tobacco Use History[2] Social History   Substance and Sexual Activity  Alcohol Use Not Currently    Family History:  Family History  Problem Relation Age of Onset   Brain cancer Mother    Cancer Mother        Brain tumor   Thyroid  disease Mother    Lung disease Mother        From [place]   Heart disease Father    Stroke Father    Hypertension Brother    Aneurysm Brother    Pneumonia Brother    Heart disease Sister        massive MI   Obesity Sister     Past medical history, surgical history, medications, allergies, family history and social history reviewed with patient today and changes made to appropriate areas of the chart.    Review of Systems  Eyes:  Negative for blurred vision and double vision.  Respiratory:  Negative for shortness of breath.   Cardiovascular:  Negative for chest pain, palpitations and leg swelling.  Neurological:  Negative for dizziness and headaches.   All other ROS negative except what is listed above and in the HPI.      Objective:    BP 114/62   Pulse (!) 36   Temp (!) 97.5 F (36.4 C) (Oral)   Ht 5' 11 (1.803 m)   Wt 206 lb 12.8 oz (93.8 kg)   SpO2 99%   BMI 28.84 kg/m   Wt Readings from Last 3 Encounters:  12/27/23 206 lb 12.8 oz (93.8 kg)  11/04/23 205 lb (93 kg)  10/21/23 203 lb (92.1 kg)    Physical Exam Vitals and nursing note reviewed.  Constitutional:      General: He is not in acute distress.    Appearance: Normal appearance. He is not ill-appearing, toxic-appearing or diaphoretic.  HENT:     Head: Normocephalic.     Right Ear: Tympanic membrane, ear canal and external ear normal.     Left Ear: Tympanic membrane, ear canal and external ear normal.     Nose: Nose normal. No congestion or rhinorrhea.     Mouth/Throat:     Mouth: Mucous membranes are moist.  Eyes:     General:        Right eye: No discharge.        Left eye: No discharge.     Extraocular Movements: Extraocular movements intact.     Conjunctiva/sclera: Conjunctivae normal.     Pupils: Pupils are equal, round, and reactive to light.  Cardiovascular:     Rate and Rhythm:  Normal rate and regular rhythm.     Heart sounds: No murmur heard. Pulmonary:     Effort: Pulmonary effort is normal. No respiratory distress.     Breath sounds: Normal breath sounds. No wheezing, rhonchi or rales.  Abdominal:     General: Abdomen is flat. Bowel sounds are normal. There is no distension.     Palpations: Abdomen is soft.     Tenderness: There is no abdominal tenderness. There is no guarding.  Musculoskeletal:     Cervical back: Normal range of motion and neck supple.  Skin:    General: Skin is warm  and dry.     Capillary Refill: Capillary refill takes less than 2 seconds.  Neurological:     General: No focal deficit present.     Mental Status: He is alert and oriented to person, place, and time.     Cranial Nerves: No cranial nerve deficit.     Motor: No weakness.     Deep Tendon Reflexes: Reflexes normal.  Psychiatric:        Mood and Affect: Mood normal.        Behavior: Behavior normal.        Thought Content: Thought content normal.        Judgment: Judgment normal.     Results for orders placed or performed in visit on 12/27/23  Microalbumin, Urine Waived   Collection Time: 12/27/23  8:54 AM  Result Value Ref Range   Microalb, Ur Waived 30 (H) 0 - 19 mg/L   Creatinine, Urine Waived 300 10 - 300 mg/dL   Microalb/Creat Ratio <30 <30 mg/g      Assessment & Plan:   Problem List Items Addressed This Visit       Cardiovascular and Mediastinum   Essential hypertension   Chronic.  Controlled.   Continue Valsartan  to 40mg  daily.  Labs ordered today.  Refills sent today.  Has seen Cardiology.   Return to clinic in 6 months for reevaluation.  Call sooner if concerns arise.       Relevant Medications   atorvastatin  (LIPITOR) 10 MG tablet   valsartan  (DIOVAN ) 40 MG tablet   Aortic atherosclerosis   Chronic.  Controlled.  Continue with current medication regimen on Atorvastatin  daily.  Refills sent today.  Labs ordered today.  Return to clinic in 6 months for reevaluation.  Call sooner if concerns arise.       Relevant Medications   atorvastatin  (LIPITOR) 10 MG tablet   valsartan  (DIOVAN ) 40 MG tablet     Respiratory   Asthmatic bronchitis , chronic (HCC)   Chronic.  Controlled.  Continue with current medication regimen.  Labs ordered today.  Return to clinic in 6 months for reevaluation.  Call sooner if concerns arise.        Relevant Medications   budesonide -formoterol  (SYMBICORT ) 80-4.5 MCG/ACT inhaler     Endocrine   Pituitary adenoma (HCC)   Under good  control on current regimen. Continue current regimen. Continue to monitor. Call with any concerns. Labs drawn today.  Follow up in 6 months.  Call sooner if concerns arise.         Genitourinary   RESOLVED: CKD (chronic kidney disease)   Chronic.  Controlled.  Continue with current medication regimen.  Need to keep blood pressures under control.  Continue with ARB for kidney protection.  Microalbumin checked.  Labs ordered today.  Return to clinic in 6 months for reevaluation.  Call sooner if concerns arise.  Relevant Orders   Microalbumin, Urine Waived (Completed)     Other   Hyperlipemia   Chronic.  Controlled.  Continue with current medication regimen on Atorvastatin  10mg  daily.  Refill sent today.  Labs ordered today.  Return to clinic in 6 months for reevaluation.  Call sooner if concerns arise.       Relevant Medications   atorvastatin  (LIPITOR) 10 MG tablet   valsartan  (DIOVAN ) 40 MG tablet   Other Relevant Orders   Lipid panel   Bradycardia   Chronic.  Saw Cardiology in August 2024.  ECHO ordered but no intervention or follow up took place. Discussed with patient that I highly recommend he follow up with Cardiology.  Patient will talk it over with his partner and call back to let me know if he wants to see Cardiology to discuss further intervention.  He will need to see [facility]- states [facility] is not covered by insurance.      Elevated glucose   Relevant Orders   Hemoglobin A1c   Other Visit Diagnoses       Encounter for Medicare annual wellness exam    -  Primary     Annual physical exam       Health maintenance reviewed during visit today. Labs ordered.  Vaccines reviewed.   Relevant Orders   TSH   PSA   Lipid panel   CBC with Differential/Platelet   Comprehensive metabolic panel with GFR   Hemoglobin A1c     Screening examination for STI       Relevant Orders   HIV Antibody (routine testing w rflx)   RPR W/RFLX TO RPR TITER, TREPONEMAL AB, SCREEN AND  DIAGNOSIS   Hepatitis C antibody   Chlamydia/Gonococcus/Trichomonas, NAA        Discussed aspirin  prophylaxis for myocardial infarction prevention and decision was it was not indicated  LABORATORY TESTING:  Health maintenance labs ordered today as discussed above.   The natural history of prostate cancer and ongoing controversy regarding screening and potential treatment outcomes of prostate cancer has been discussed with the patient. The meaning of a false positive PSA and a false negative PSA has been discussed. He indicates understanding of the limitations of this screening test and wishes to proceed with screening PSA testing.   IMMUNIZATIONS:   - Tdap: Tetanus vaccination status reviewed: last tetanus booster within 10 years. - Influenza: Up to date - Pneumovax: Up to date - Prevnar: Up to date - COVID: Up to date - HPV: Not applicable - Shingrix vaccine: Up to date  SCREENING: - Colonoscopy: Up to date  Discussed with patient purpose of the colonoscopy is to detect colon cancer at curable precancerous or early stages   - AAA Screening: Not up to date -Hearing Test: Not applicable  -Spirometry: Not applicable   PATIENT COUNSELING:    Sexuality: Discussed sexually transmitted diseases, partner selection, use of condoms, avoidance of unintended pregnancy  and contraceptive alternatives.   Advised to avoid cigarette smoking.  I discussed with the patient that most people either abstain from alcohol or drink within safe limits (<=14/week and <=4 drinks/occasion for males, <=7/weeks and <= 3 drinks/occasion for females) and that the risk for alcohol disorders and other health effects rises proportionally with the number of drinks per week and how often a drinker exceeds daily limits.  Discussed cessation/primary prevention of drug use and availability of treatment for abuse.   Diet: Encouraged to adjust caloric intake to maintain  or achieve ideal body  weight, to reduce  intake of dietary saturated fat and total fat, to limit sodium intake by avoiding high sodium foods and not adding table salt, and to maintain adequate dietary potassium and calcium  preferably from fresh fruits, vegetables, and low-fat dairy products.    stressed the importance of regular exercise  Injury prevention: Discussed safety belts, safety helmets, smoke detector, smoking near bedding or upholstery.   Dental health: Discussed importance of regular tooth brushing, flossing, and dental visits.   Follow up plan: NEXT PREVENTATIVE PHYSICAL DUE IN 1 YEAR. Return in about 3 months (around 03/26/2024) for HTN, HLD, DM2 FU.     [1] No Known Allergies [2]  Social History Tobacco Use  Smoking Status Every Day   Current packs/day: 0.25   Average packs/day: 0.3 packs/day for 40.0 years (10.0 ttl pk-yrs)   Types: Cigarettes   Passive exposure: Past  Smokeless Tobacco Never  Tobacco Comments   2-3 cig daily 10/29/2020   "

## 2023-12-27 NOTE — Assessment & Plan Note (Signed)
 Chronic.  Controlled.   Continue Valsartan  to 40mg  daily.  Labs ordered today.  Refills sent today.  Has seen Cardiology.   Return to clinic in 6 months for reevaluation.  Call sooner if concerns arise.

## 2023-12-27 NOTE — Assessment & Plan Note (Signed)
 Chronic.  Controlled.  Continue with current medication regimen.  Need to keep blood pressures under control.  Continue with ARB for kidney protection.  Microalbumin checked.  Labs ordered today.  Return to clinic in 6 months for reevaluation.  Call sooner if concerns arise.

## 2023-12-27 NOTE — Assessment & Plan Note (Signed)
Chronic.  Controlled.  Continue with current medication regimen on Atorvastatin daily.  Refills sent today.  Labs ordered today.  Return to clinic in 6 months for reevaluation.  Call sooner if concerns arise.   

## 2023-12-27 NOTE — Assessment & Plan Note (Signed)
Chronic.  Controlled.  Continue with current medication regimen on Atorvastatin 10mg daily.  Refill sent today.  Labs ordered today.  Return to clinic in 6 months for reevaluation.  Call sooner if concerns arise.   

## 2023-12-27 NOTE — Assessment & Plan Note (Signed)
 Chronic.  Controlled.  Continue with current medication regimen.  Labs ordered today.  Return to clinic in 6 months for reevaluation.  Call sooner if concerns arise.  ? ?

## 2023-12-27 NOTE — Progress Notes (Signed)
 "  Chief Complaint  Patient presents with   Medicare Wellness    6 month F/u     Subjective:   Matthew Randolph is a 83 y.o. male who presents for a Medicare Annual Wellness Visit.  Fall Screening Falls in the past year?: 0 Number of falls in past year: 0 Was there an injury with Fall?: 0 Fall Risk Category Calculator: 0 Patient Fall Risk Level: Low Fall Risk  Fall Risk Patient at Risk for Falls Due to: No Fall Risks Fall risk Follow up: Falls evaluation completed    Allergies (verified) Patient has no known allergies.   Current Medications (verified) Outpatient Encounter Medications as of 12/27/2023  Medication Sig   acetaminophen  (TYLENOL ) 500 MG tablet Take 500 mg by mouth every 6 (six) hours as needed.   albuterol  (VENTOLIN  HFA) 108 (90 Base) MCG/ACT inhaler Inhale 2 puffs into the lungs every 6 (six) hours as needed for wheezing or shortness of breath.   atorvastatin  (LIPITOR) 10 MG tablet Take 1 tablet (10 mg total) by mouth daily.   budesonide -formoterol  (SYMBICORT ) 80-4.5 MCG/ACT inhaler Take 2 puffs first thing in am and then another 2 puffs about 12 hours later.   famotidine  (PEPCID ) 20 MG tablet One after supper   fluticasone  (FLONASE ) 50 MCG/ACT nasal spray Place 2 sprays into both nostrils daily.   meloxicam  (MOBIC ) 15 MG tablet Take 1 tablet (15 mg total) by mouth daily.   Menthol , Topical Analgesic, (ICY HOT EX) Apply 1 application topically daily as needed (pain).   Multiple Vitamin (MULTIVITAMIN) tablet Take 1 tablet by mouth daily.   valsartan  (DIOVAN ) 40 MG tablet Take 1 tablet (40 mg total) by mouth daily.   No facility-administered encounter medications on file as of 12/27/2023.    History: Past Medical History:  Diagnosis Date   Arthritis    Benign tumor of pituitary gland (HCC)    Chronic kidney disease    ED (erectile dysfunction)    Erectile dysfunction    GERD (gastroesophageal reflux disease)    High risk sexual behavior    Hyperlipidemia     Hypertension    Pneumonia 2012   Tobacco use disorder, continuous    Past Surgical History:  Procedure Laterality Date   COLONOSCOPY  2015   DENTAL SURGERY  2024   LIPOMA EXCISION Left 10/04/2018   Procedure: EXCISION LIPOMA LEFT HIP;  Surgeon: Kathlynn Sharper, MD;  Location: [facility] ORS;  Service: Orthopedics;  Laterality: Left;   PITUITARY SURGERY  12/10/2015   TOTAL HIP ARTHROPLASTY Right 09/09/2017   Procedure: TOTAL HIP ARTHROPLASTY ANTERIOR APPROACH;  Surgeon: Kathlynn Sharper, MD;  Location: [facility] ORS;  Service: Orthopedics;  Laterality: Right;   TOTAL HIP ARTHROPLASTY Left 10/04/2018   Procedure: TOTAL HIP ARTHROPLASTY ANTERIOR APPROACH;  Surgeon: Kathlynn Sharper, MD;  Location: [facility] ORS;  Service: Orthopedics;  Laterality: Left;   Family History  Problem Relation Age of Onset   Brain cancer Mother    Cancer Mother        Brain tumor   Thyroid  disease Mother    Lung disease Mother        From [place]   Heart disease Father    Stroke Father    Hypertension Brother    Aneurysm Brother    Pneumonia Brother    Heart disease Sister        massive MI   Obesity Sister    Social History   Occupational History   Occupation: Retired Systems Developer  Tobacco Use  Smoking status: Every Day    Current packs/day: 0.25    Average packs/day: 0.3 packs/day for 40.0 years (10.0 ttl pk-yrs)    Types: Cigarettes    Passive exposure: Past   Smokeless tobacco: Never   Tobacco comments:    2-3 cig daily 10/29/2020  Vaping Use   Vaping status: Never Used  Substance and Sexual Activity   Alcohol use: Not Currently   Drug use: No   Sexual activity: Yes    Partners: Female   Tobacco Counseling Ready to quit: Not Answered Counseling given: Not Answered Tobacco comments: 2-3 cig daily 10/29/2020  SDOH Screenings   Food Insecurity: No Food Insecurity (11/12/2023)   [facility] from [facility]  Housing: Low Risk  (11/12/2023)   [facility]   Transportation Needs: No Transportation Needs (11/12/2023)   [facility]  Utilities: Not At Risk (11/12/2023)   Received from [facility]  Alcohol Screen: Low Risk (06/16/2022)  Depression (PHQ2-9): Low Risk (10/19/2023)  Financial Resource Strain: Low Risk  (11/12/2023)   Received from [facility]  Physical Activity: Insufficiently Active (06/16/2022)  Social Connections: Moderately Isolated (06/16/2022)  Stress: No Stress Concern Present (06/16/2022)  Tobacco Use: High Risk (12/27/2023)   See flowsheets for full screening details  Depression Screen PHQ 2 & 9 Depression Scale- Over the past 2 weeks, how often have you been bothered by any of the following problems? Little interest or pleasure in doing things: 0 Feeling down, depressed, or hopeless (PHQ Adolescent also includes...irritable): 0 PHQ-2 Total Score: 0 Trouble falling or staying asleep, or sleeping too much: 0 Feeling tired or having little energy: 0 Poor appetite or overeating (PHQ Adolescent also includes...weight loss): 0 Feeling bad about yourself - or that you are a failure or have let yourself or your family down: 0 Trouble concentrating on things, such as reading the newspaper or watching television (PHQ Adolescent also includes...like school work): 0 Moving or speaking so slowly that other people could have noticed. Or the opposite - being so fidgety or restless that you have been moving around a lot more than usual: 0 Thoughts that you would be better off dead, or of hurting yourself in some way: 0 PHQ-9 Total Score: 0     Goals Addressed   None          Objective:    Today's Vitals   12/27/23 0805  TempSrc: Oral   There is no height or weight on file to calculate BMI.  Hearing/Vision screen No results found. Immunizations and Health Maintenance Health Maintenance  Topic Date Due   Zoster Vaccines- Shingrix (1 of 2) 11/24/1992    Influenza Vaccine  08/06/2023   COVID-19 Vaccine (7 - 2025-26 season) 05/04/2024   Medicare Annual Wellness (AWV)  12/26/2024   DTaP/Tdap/Td (4 - Tdap) 08/20/2026   Pneumococcal Vaccine: 50+ Years  Completed   Meningococcal B Vaccine  Aged Out   Colonoscopy  Discontinued   Hepatitis C Screening  Discontinued        Assessment/Plan:  This is a routine wellness examination for Matthew Randolph.  Patient Care Team: Melvin Pao, NP as PCP - General Darliss Rogue, MD as PCP - Cardiology (Cardiology) Saturnino Arch, MD as Referring Physician (Neurosurgery) Merlynn Lyle CROME, LCSW as [facility] Care Management (Licensed Clinical Social Worker)  I have personally reviewed and noted the following in the patients chart:   Medical and social history Use of  alcohol, tobacco or illicit drugs  Current medications and supplements including opioid prescriptions. Functional ability and status Nutritional status Physical activity Advanced directives List of other physicians Hospitalizations, surgeries, and ER visits in previous 12 months Vitals Screenings to include cognitive, depression, and falls Referrals and appointments  No orders of the defined types were placed in this encounter.  In addition, I have reviewed and discussed with patient certain preventive protocols, quality metrics, and best practice recommendations. A written personalized care plan for preventive services as well as general preventive health recommendations were provided to patient.   Matthew Randolph, CMA   12/27/2023   No follow-ups on file.  After Visit Summary: (In Person-Printed) AVS printed and given to the patient   "

## 2023-12-27 NOTE — Assessment & Plan Note (Addendum)
 Under good control on current regimen. Continue current regimen. Continue to monitor. Call with any concerns. Labs drawn today.  Follow up in 6 months.  Call sooner if concerns arise.

## 2023-12-27 NOTE — Assessment & Plan Note (Addendum)
 Chronic.  Saw Cardiology in August 2024.  ECHO ordered but no intervention or follow up took place. Discussed with patient that I highly recommend he follow up with Cardiology.  Patient will talk it over with his partner and call back to let me know if he wants to see Cardiology to discuss further intervention.  He will need to see [place] [facility]- states [facility] is not covered by insurance.

## 2023-12-28 ENCOUNTER — Ambulatory Visit: Payer: Self-pay | Admitting: Nurse Practitioner

## 2023-12-28 LAB — COMPREHENSIVE METABOLIC PANEL WITH GFR
ALT: 22 IU/L (ref 0–44)
AST: 24 IU/L (ref 0–40)
Albumin: 4.2 g/dL (ref 3.7–4.7)
Alkaline Phosphatase: 52 IU/L (ref 48–129)
BUN/Creatinine Ratio: 12 (ref 10–24)
BUN: 15 mg/dL (ref 8–27)
Bilirubin Total: 0.6 mg/dL (ref 0.0–1.2)
CO2: 20 mmol/L (ref 20–29)
Calcium: 9.5 mg/dL (ref 8.6–10.2)
Chloride: 104 mmol/L (ref 96–106)
Creatinine, Ser: 1.21 mg/dL (ref 0.76–1.27)
Globulin, Total: 2.4 g/dL (ref 1.5–4.5)
Glucose: 97 mg/dL (ref 70–99)
Potassium: 4.2 mmol/L (ref 3.5–5.2)
Sodium: 139 mmol/L (ref 134–144)
Total Protein: 6.6 g/dL (ref 6.0–8.5)
eGFR: 60 mL/min/1.73

## 2023-12-28 LAB — CBC WITH DIFFERENTIAL/PLATELET
Basophils Absolute: 0 x10E3/uL (ref 0.0–0.2)
Basos: 1 %
EOS (ABSOLUTE): 0.1 x10E3/uL (ref 0.0–0.4)
Eos: 2 %
Hematocrit: 42.8 % (ref 37.5–51.0)
Hemoglobin: 13.3 g/dL (ref 13.0–17.7)
Immature Grans (Abs): 0 x10E3/uL (ref 0.0–0.1)
Immature Granulocytes: 0 %
Lymphocytes Absolute: 2.3 x10E3/uL (ref 0.7–3.1)
Lymphs: 46 %
MCH: 27.7 pg (ref 26.6–33.0)
MCHC: 31.1 g/dL — ABNORMAL LOW (ref 31.5–35.7)
MCV: 89 fL (ref 79–97)
Monocytes Absolute: 0.5 x10E3/uL (ref 0.1–0.9)
Monocytes: 9 %
Neutrophils Absolute: 2.1 x10E3/uL (ref 1.4–7.0)
Neutrophils: 42 %
Platelets: 246 x10E3/uL (ref 150–450)
RBC: 4.81 x10E6/uL (ref 4.14–5.80)
RDW: 12.7 % (ref 11.6–15.4)
WBC: 5.1 x10E3/uL (ref 3.4–10.8)

## 2023-12-28 LAB — LIPID PANEL
Chol/HDL Ratio: 2.4 ratio (ref 0.0–5.0)
Cholesterol, Total: 158 mg/dL (ref 100–199)
HDL: 67 mg/dL
LDL Chol Calc (NIH): 77 mg/dL (ref 0–99)
Triglycerides: 75 mg/dL (ref 0–149)
VLDL Cholesterol Cal: 14 mg/dL (ref 5–40)

## 2023-12-28 LAB — SYPHILIS: RPR W/REFLEX TO RPR TITER AND TREPONEMAL ANTIBODIES, TRADITIONAL SCREENING AND DIAGNOSIS ALGORITHM: RPR Ser Ql: NONREACTIVE

## 2023-12-28 LAB — PSA: Prostate Specific Ag, Serum: 3.4 ng/mL (ref 0.0–4.0)

## 2023-12-28 LAB — HIV ANTIBODY (ROUTINE TESTING W REFLEX): HIV Screen 4th Generation wRfx: NONREACTIVE

## 2023-12-28 LAB — HEMOGLOBIN A1C
Est. average glucose Bld gHb Est-mCnc: 114 mg/dL
Hgb A1c MFr Bld: 5.6 % (ref 4.8–5.6)

## 2023-12-28 LAB — TSH: TSH: 1.23 u[IU]/mL (ref 0.450–4.500)

## 2023-12-28 LAB — HEPATITIS C ANTIBODY: Hep C Virus Ab: NONREACTIVE

## 2023-12-29 LAB — CHLAMYDIA/GONOCOCCUS/TRICHOMONAS, NAA
Chlamydia by NAA: NEGATIVE
Gonococcus by NAA: NEGATIVE
Trich vag by NAA: NEGATIVE

## 2024-01-18 ENCOUNTER — Other Ambulatory Visit: Payer: Self-pay | Admitting: Nurse Practitioner

## 2024-02-08 ENCOUNTER — Other Ambulatory Visit: Payer: Self-pay | Admitting: Nurse Practitioner

## 2024-02-10 NOTE — Telephone Encounter (Signed)
 Requested Prescriptions  Pending Prescriptions Disp Refills   valsartan  (DIOVAN ) 40 MG tablet [Pharmacy Med Name: Valsartan  40 MG Oral Tablet] 80 tablet 3    Sig: TAKE 1 TABLET BY MOUTH DAILY     Cardiovascular:  Angiotensin Receptor Blockers Passed - 02/10/2024  9:07 AM      Passed - Cr in normal range and within 180 days    Creatinine, Ser  Date Value Ref Range Status  12/27/2023 1.21 0.76 - 1.27 mg/dL Final         Passed - K in normal range and within 180 days    Potassium  Date Value Ref Range Status  12/27/2023 4.2 3.5 - 5.2 mmol/L Final         Passed - Patient is not pregnant      Passed - Last BP in normal range    BP Readings from Last 1 Encounters:  12/27/23 114/62         Passed - Valid encounter within last 6 months    Recent Outpatient Visits           1 month ago Encounter for [employer] annual wellness exam   [place] [facility] Melvin Pao, NP   3 months ago Scrotal abscess   [place] [facility] [place], Melanie T, NP   7 months ago Pituitary adenoma [facility])   [place] [facility] Melvin Pao, NP               fluticasone  (FLONASE ) 50 MCG/ACT nasal spray [Pharmacy Med Name: Fluticasone  Propionate 50 MCG/ACT Nasal Suspension] 64 g 0    Sig: USE 2 SPRAYS IN BOTH NOSTRILS  DAILY     Ear, Nose, and Throat: Nasal Preparations - Corticosteroids Passed - 02/10/2024  9:07 AM      Passed - Valid encounter within last 12 months    Recent Outpatient Visits           1 month ago Encounter for [employer] annual wellness exam   [place] [facility] Melvin Pao, NP   3 months ago Scrotal abscess   [place] [facility] [place], Melanie T, NP   7 months ago Pituitary adenoma [facility])   [facility] Melvin Pao, NP

## 2024-03-28 ENCOUNTER — Ambulatory Visit: Admitting: Nurse Practitioner

## 2024-12-27 ENCOUNTER — Encounter: Admitting: Nurse Practitioner
